# Patient Record
Sex: Female | Born: 1935 | Race: White | Hispanic: No | Smoking: Never smoker
Health system: Southern US, Community
[De-identification: ages and names within clinical notes are randomized; demographics above are authoritative.]

## PROBLEM LIST (undated history)

## (undated) DIAGNOSIS — M1990 Unspecified osteoarthritis, unspecified site: Secondary | ICD-10-CM

## (undated) DIAGNOSIS — E785 Hyperlipidemia, unspecified: Secondary | ICD-10-CM

## (undated) DIAGNOSIS — K5900 Constipation, unspecified: Secondary | ICD-10-CM

## (undated) DIAGNOSIS — K279 Peptic ulcer, site unspecified, unspecified as acute or chronic, without hemorrhage or perforation: Secondary | ICD-10-CM

## (undated) DIAGNOSIS — I10 Essential (primary) hypertension: Secondary | ICD-10-CM

## (undated) DIAGNOSIS — K8020 Calculus of gallbladder without cholecystitis without obstruction: Secondary | ICD-10-CM

## (undated) DIAGNOSIS — E119 Type 2 diabetes mellitus without complications: Secondary | ICD-10-CM

## (undated) DIAGNOSIS — G8929 Other chronic pain: Secondary | ICD-10-CM

## (undated) HISTORY — DX: Essential (primary) hypertension: I10

## (undated) HISTORY — DX: Constipation, unspecified: K59.00

## (undated) HISTORY — PX: CYSTOPLASTY: SHX475

## (undated) HISTORY — DX: Hyperlipidemia, unspecified: E78.5

## (undated) HISTORY — DX: Type 2 diabetes mellitus without complications: E11.9

## (undated) HISTORY — PX: OTHER SURGICAL HISTORY: SHX169

## (undated) HISTORY — DX: Unspecified osteoarthritis, unspecified site: M19.90

## (undated) HISTORY — DX: Other chronic pain: G89.29

## (undated) HISTORY — PX: ABDOMINAL HYSTERECTOMY: SHX81

## (undated) HISTORY — DX: Calculus of gallbladder without cholecystitis without obstruction: K80.20

## (undated) HISTORY — DX: Peptic ulcer, site unspecified, unspecified as acute or chronic, without hemorrhage or perforation: K27.9

---

## 2001-11-16 ENCOUNTER — Emergency Department (HOSPITAL_COMMUNITY): Admission: EM | Admit: 2001-11-16 | Discharge: 2001-11-16 | Payer: Self-pay | Admitting: *Deleted

## 2002-03-05 ENCOUNTER — Emergency Department (HOSPITAL_COMMUNITY): Admission: EM | Admit: 2002-03-05 | Discharge: 2002-03-05 | Payer: Self-pay | Admitting: Internal Medicine

## 2002-03-05 ENCOUNTER — Encounter: Payer: Self-pay | Admitting: Emergency Medicine

## 2002-06-08 ENCOUNTER — Emergency Department (HOSPITAL_COMMUNITY): Admission: EM | Admit: 2002-06-08 | Discharge: 2002-06-08 | Payer: Self-pay | Admitting: Emergency Medicine

## 2002-06-09 ENCOUNTER — Emergency Department (HOSPITAL_COMMUNITY): Admission: EM | Admit: 2002-06-09 | Discharge: 2002-06-09 | Payer: Self-pay | Admitting: *Deleted

## 2002-06-21 ENCOUNTER — Emergency Department (HOSPITAL_COMMUNITY): Admission: EM | Admit: 2002-06-21 | Discharge: 2002-06-21 | Payer: Self-pay | Admitting: Emergency Medicine

## 2003-04-02 ENCOUNTER — Emergency Department (HOSPITAL_COMMUNITY): Admission: EM | Admit: 2003-04-02 | Discharge: 2003-04-02 | Payer: Self-pay | Admitting: Emergency Medicine

## 2003-04-02 ENCOUNTER — Encounter: Payer: Self-pay | Admitting: Emergency Medicine

## 2003-12-17 ENCOUNTER — Ambulatory Visit (HOSPITAL_COMMUNITY): Admission: RE | Admit: 2003-12-17 | Discharge: 2003-12-17 | Payer: Self-pay | Admitting: Family Medicine

## 2005-08-12 ENCOUNTER — Emergency Department (HOSPITAL_COMMUNITY): Admission: EM | Admit: 2005-08-12 | Discharge: 2005-08-12 | Payer: Self-pay | Admitting: Emergency Medicine

## 2006-06-30 ENCOUNTER — Emergency Department (HOSPITAL_COMMUNITY): Admission: EM | Admit: 2006-06-30 | Discharge: 2006-06-30 | Payer: Self-pay | Admitting: Emergency Medicine

## 2006-10-14 ENCOUNTER — Emergency Department (HOSPITAL_COMMUNITY): Admission: EM | Admit: 2006-10-14 | Discharge: 2006-10-14 | Payer: Self-pay | Admitting: Emergency Medicine

## 2006-11-24 ENCOUNTER — Inpatient Hospital Stay (HOSPITAL_COMMUNITY): Admission: EM | Admit: 2006-11-24 | Discharge: 2006-12-04 | Payer: Self-pay | Admitting: Emergency Medicine

## 2006-11-24 ENCOUNTER — Ambulatory Visit: Payer: Self-pay | Admitting: Orthopedic Surgery

## 2006-11-27 ENCOUNTER — Ambulatory Visit: Payer: Self-pay | Admitting: Cardiovascular Disease

## 2006-12-04 ENCOUNTER — Inpatient Hospital Stay: Admission: AD | Admit: 2006-12-04 | Discharge: 2007-02-25 | Payer: Self-pay | Admitting: Family Medicine

## 2006-12-25 ENCOUNTER — Ambulatory Visit: Payer: Self-pay | Admitting: Orthopedic Surgery

## 2007-01-16 ENCOUNTER — Ambulatory Visit: Payer: Self-pay | Admitting: Orthopedic Surgery

## 2007-01-21 ENCOUNTER — Ambulatory Visit (HOSPITAL_COMMUNITY): Admission: RE | Admit: 2007-01-21 | Discharge: 2007-01-21 | Payer: Self-pay | Admitting: Family Medicine

## 2007-11-22 ENCOUNTER — Emergency Department (HOSPITAL_COMMUNITY): Admission: EM | Admit: 2007-11-22 | Discharge: 2007-11-22 | Payer: Self-pay | Admitting: Emergency Medicine

## 2008-02-04 ENCOUNTER — Emergency Department (HOSPITAL_COMMUNITY): Admission: EM | Admit: 2008-02-04 | Discharge: 2008-02-04 | Payer: Self-pay | Admitting: Emergency Medicine

## 2008-05-26 ENCOUNTER — Ambulatory Visit (HOSPITAL_COMMUNITY): Admission: RE | Admit: 2008-05-26 | Discharge: 2008-05-26 | Payer: Self-pay | Admitting: Family Medicine

## 2008-08-10 ENCOUNTER — Ambulatory Visit: Payer: Self-pay | Admitting: Gastroenterology

## 2008-08-10 ENCOUNTER — Inpatient Hospital Stay (HOSPITAL_COMMUNITY): Admission: EM | Admit: 2008-08-10 | Discharge: 2008-08-11 | Payer: Self-pay | Admitting: Emergency Medicine

## 2008-08-11 ENCOUNTER — Ambulatory Visit: Payer: Self-pay | Admitting: Gastroenterology

## 2008-08-11 ENCOUNTER — Encounter: Payer: Self-pay | Admitting: Gastroenterology

## 2008-08-28 ENCOUNTER — Inpatient Hospital Stay (HOSPITAL_COMMUNITY): Admission: EM | Admit: 2008-08-28 | Discharge: 2008-09-01 | Payer: Self-pay | Admitting: Emergency Medicine

## 2008-08-29 ENCOUNTER — Ambulatory Visit: Payer: Self-pay | Admitting: Gastroenterology

## 2008-08-30 ENCOUNTER — Ambulatory Visit: Payer: Self-pay | Admitting: Gastroenterology

## 2008-09-01 ENCOUNTER — Ambulatory Visit: Payer: Self-pay | Admitting: Internal Medicine

## 2008-12-03 ENCOUNTER — Ambulatory Visit: Payer: Self-pay | Admitting: Gastroenterology

## 2009-03-12 ENCOUNTER — Emergency Department (HOSPITAL_COMMUNITY): Admission: EM | Admit: 2009-03-12 | Discharge: 2009-03-12 | Payer: Self-pay | Admitting: Emergency Medicine

## 2009-07-08 ENCOUNTER — Emergency Department (HOSPITAL_COMMUNITY): Admission: EM | Admit: 2009-07-08 | Discharge: 2009-07-08 | Payer: Self-pay | Admitting: Emergency Medicine

## 2009-10-21 ENCOUNTER — Ambulatory Visit (HOSPITAL_COMMUNITY): Admission: RE | Admit: 2009-10-21 | Discharge: 2009-10-21 | Payer: Self-pay | Admitting: Family Medicine

## 2010-07-13 ENCOUNTER — Inpatient Hospital Stay (HOSPITAL_COMMUNITY)
Admission: EM | Admit: 2010-07-13 | Discharge: 2010-07-17 | Payer: Self-pay | Source: Home / Self Care | Admitting: Emergency Medicine

## 2010-10-07 ENCOUNTER — Emergency Department (HOSPITAL_COMMUNITY)
Admission: EM | Admit: 2010-10-07 | Discharge: 2010-10-07 | Payer: Self-pay | Source: Home / Self Care | Admitting: Emergency Medicine

## 2010-10-29 ENCOUNTER — Encounter: Payer: Self-pay | Admitting: Family Medicine

## 2010-12-20 LAB — CBC
MCV: 89.5 fL (ref 78.0–100.0)
Platelets: 188 10*3/uL (ref 150–400)
RBC: 4.07 MIL/uL (ref 3.87–5.11)

## 2010-12-20 LAB — URINALYSIS, ROUTINE W REFLEX MICROSCOPIC
Bilirubin Urine: NEGATIVE
Glucose, UA: NEGATIVE mg/dL
Urobilinogen, UA: 0.2 mg/dL (ref 0.0–1.0)

## 2010-12-20 LAB — BASIC METABOLIC PANEL
CO2: 28 mEq/L (ref 19–32)
Calcium: 9.6 mg/dL (ref 8.4–10.5)
Chloride: 105 mEq/L (ref 96–112)
Creatinine, Ser: 0.87 mg/dL (ref 0.4–1.2)
GFR calc Af Amer: >60 mL/min (ref >60)
GFR calc non Af Amer: >60 mL/min (ref >60)
Glucose, Bld: 86 mg/dL (ref 70–99)

## 2010-12-20 LAB — CULTURE, BLOOD (ROUTINE X 2)
Culture: NO GROWTH
Culture: NO GROWTH
Report Status: 10112011

## 2010-12-20 LAB — DIFFERENTIAL
Basophils Absolute: 0 10*3/uL (ref 0.0–0.1)
Basophils Relative: 0 % (ref 0–1)

## 2010-12-20 LAB — URINE MICROSCOPIC-ADD ON

## 2010-12-20 LAB — URINE CULTURE

## 2010-12-20 LAB — LACTIC ACID, PLASMA: Lactic Acid, Venous: 1.1 mmol/L (ref 0.5–2.2)

## 2011-01-11 LAB — COMPREHENSIVE METABOLIC PANEL
BUN: 19 mg/dL (ref 6–23)
CO2: 27 mEq/L (ref 19–32)
Calcium: 9.3 mg/dL (ref 8.4–10.5)
Creatinine, Ser: 0.99 mg/dL (ref 0.4–1.2)
GFR calc non Af Amer: 53 mL/min — ABNORMAL LOW (ref >60)
Glucose, Bld: 163 mg/dL — ABNORMAL HIGH (ref 70–99)

## 2011-01-11 LAB — DIFFERENTIAL
Eosinophils Absolute: 0.2 10*3/uL (ref 0.0–0.7)
Lymphocytes Relative: 22 % (ref 12–46)
Lymphs Abs: 1.3 10*3/uL (ref 0.7–4.0)
Neutro Abs: 3.8 10*3/uL (ref 1.7–7.7)
Neutrophils Relative %: 67 % (ref 43–77)

## 2011-01-11 LAB — CBC
Hemoglobin: 12.2 g/dL (ref 12.0–15.0)
MCHC: 35.6 g/dL (ref 30.0–36.0)
MCV: 90.5 fL (ref 78.0–100.0)
RBC: 3.79 MIL/uL — ABNORMAL LOW (ref 3.87–5.11)
RDW: 13.4 % (ref 11.5–15.5)

## 2011-01-15 LAB — URINALYSIS, ROUTINE W REFLEX MICROSCOPIC
Glucose, UA: NEGATIVE mg/dL
Protein, ur: NEGATIVE mg/dL
Specific Gravity, Urine: 1.025 (ref 1.005–1.030)
pH: 6.5 (ref 5.0–8.0)

## 2011-02-20 NOTE — H&P (Signed)
Chelsea [name], Chelsea [name]                ACCOUNT NO.:  [id number]   MEDICAL RECORD NO.:  [id number]          PATIENT TYPE:  INP   LOCATION:  A335                          FACILITY:  [facility]   PHYSICIAN:  [name]. [name], M.D.DATE OF BIRTH:  Oct 18, 1918   DATE OF ADMISSION:  08/27/2008  DATE OF DISCHARGE:  LH                              HISTORY & PHYSICAL   This 77 year old presented to the emergency room with wheezing and  shortness of breath early this morning.  She had recently been  hospitalized, just about 2 weeks ago with a GI bleeding, which is felt  to be due to her ulceration at the base of diverticulum and was also  felt to be secondary to her use of diclofenac.  At discharge the  diclofenac was supposedly discontinued.   CHRONIC MEDICAL PROBLEMS:  Mild diabetes, hypercholesterolemia,  cholelithiasis, sigmoid diverticulosis, and multilevel degenerative disk  disease of thoracolumbar spine.   She is very deaf.  History is difficult to obtain from her for this  reason.   Review of the record shows when she came to the emergency room, she had  no respiratory distress.  I did note she had had a sore throat that  begun a day prior to admission along with a cough productive of clear  sputum along with fever as well.   She had no chest pain.  She did note she had thrown up 3 times since she  came home, 1 time she throw up a black material, but that had been for a  week.  She noted no abdominal pain, no diarrhea, or constipation.   CURRENT MEDICATIONS:  1. Mycelex t.i.d.  2. Gemfibrozil 600 mg b.i.d.  3. Diclofenac 75 mg b.i.d., I am not sure if she is actually taking it      or not.   Her admission blood pressure is 151/84, pulse 84, respiratory rate 18,  and temperature 100.8.  The time, I saw her, she was oriented and alert.  No acute distress.  Again, major problems with profound deafness making  it necessary to yell at the patient to be heard.  She is very  cooperative and  nice, however, even with this disability.   She has no known allergies.   MEDICAL HISTORY:  See notes, includes hypertension, arthritis, chronic  back pain, gastric ulcer, and she has had a cystoplasty and a  hysterectomy for surgery.  She does not smoke, drink, or use drugs.  She  has had no recent air travel or prolonged road trip.  On my exam, her  mucous membranes are moist.  Skin turgor is normal.  She had no axillary  or supraclavicular adenopathy.  Her lungs appeared clear throughout.  The heart had a regular rhythm with a rate of about 100.  The abdomen  was soft and obese without organomegaly, mass, or tenderness.  There is  no edema of the ankles.   Admission white cell count is 10,900, hemoglobin 8.2., and platelet  count 300,000.  She had 94% neutrophils and 4 lymphs.  Admission BMET  showed a bicarb of  18 and glucose 312.  Recheck H and H 8.1/24.1 after a  unit of packed cells.  Blood cultures times 2 showed no growth less than  24 hours.   She had a CT of her abdomen and chest, just 2 days ago, due to chronic  right upper quadrant pain.  This showed cholelithiasis, bilateral renal  cysts, and multilevel degenerative disk disease, changes in  thoracolumbar spine with sigmoid diverticulosis without diverticulitis.  A small left ovarian cyst measuring 2.7 x 2.1 cm up from 1.6 x 1.2 cm in  her last CT.  Her chest x-ray showed cardiomegaly, interstitial  prominence, and no acute disease.   ADMISSION DIAGNOSES:  1. Presumptive asthmatic bronchitis.  2. Anemia probably secondary to gastrointestinal bleeding and with a      history of hematemesis, probably stomach ulcer.  3. Profound deafness.  4. Reflux esophagitis.  5. Colonic diverticuli.  6. Cholelithiasis.  7. Benign essential hypertension.  8. Diabetes.   Currently, she is getting another unit of packed cells and we will check  H and H after this.  She is on no medication for bronchitis, but appears  to be getting  better.  She did get a dose of Solu-Medrol 125 mg IV,  however.  She is on sensitive sliding scale and Rocephin 1 g IV q.24 h.,  albuterol neb treatments q.4 h. while awake, Tylenol for fever or pain,  and she is in IV normal saline 75 mL an hour.   I have discussed her case with Dr. Kassie Mends, her gastroenterologist.  I have asked her to take a look tomorrow to see if she might need an  EGD.  They were trying to find out information from home and see whether  she actually is using the diclofenac or not.      [name]. [name], M.D.  Electronically Signed     SDK/MEDQ  D:  08/28/2008  T:  08/28/2008  Job:  [id number]

## 2011-02-20 NOTE — Discharge Summary (Signed)
Chelsea [name], Chelsea [name]                ACCOUNT NO.:  [id number]   MEDICAL RECORD NO.:  [id number]          PATIENT TYPE:  ORB   LOCATION:  S116                          FACILITY:  [facility]   PHYSICIAN:  [name]. [name], M.D.DATE OF BIRTH:  1919/06/25   DATE OF ADMISSION:  12/04/2006  DATE OF DISCHARGE:  05/20/2008LH                               DISCHARGE SUMMARY   HISTORY OF PRESENT ILLNESS:  This 77 year old fell and fractured her hip  back in February.  After her hospitalization, she was transferred to the  [facility] where she has been for about 3 months.  She  has been recuperating during this having physical therapy.   I talked to physical therapy and she is able to ambulate with assistance  and with a walker, but she is not walking on her own, nor is she using a  walker on her own.  She is ready for discharge home tomorrow and has a  son who apparently is not working who will be with her full-time.   In the hospital, she has been on omeprazole 20 mg daily, multivitamin  daily, Senokot b.i.d., Colace 100 mg b.i.d., Coreg 6.25 mg b.i.d., and  enteric coated aspirin 81 mg daily.  She has had physical therapy five  times a week.   PHYSICAL EXAMINATION:  GENERAL:  Exam today showed an 77 year old woman  who appeared to be alert.  She was sitting up in bed.  She was in no  acute distress.  HEART:  Heart had a regular rhythm with rate of 70.  LUNGS:  Lungs were clear throughout.  SKIN:  Skin turgor was normal.  EXTREMITIES:  There was a 1/2 cm diameter 1 mm ulceration of the left  lateral malleolus and there is trace edema of the ankles and feet.   DISCHARGE DIAGNOSES:  1. Healed trochanteric fracture of left hip.  2. Status post rhabdomyolysis.  3. Status post dehydration.  4. Benign essential hypertension.  5. Decubitus on the left ankle.   PLAN:  She is being discharged home tomorrow and her son will take care  of her.  Arrangements have been made for to  have a wheeled walker and a  wheelchair and he will be assisting her.  She is to continue the  medicines as noted above.   ACTIVITY:  She is to have physical therapy 5 days a week as long as we  can arrange this.   FOLLOW UP:  Follow up in the office within 2 weeks.      [name]. [name], M.D.  Electronically Signed     SDK/MEDQ  D:  02/24/2007  T:  02/24/2007  Job:  [id number]

## 2011-02-20 NOTE — Group Therapy Note (Signed)
Chelsea [name], Chelsea [name]                ACCOUNT NO.:  [id number]   MEDICAL RECORD NO.:  [id number]          PATIENT TYPE:  INP   LOCATION:  A335                          FACILITY:  [facility]   PHYSICIAN:  [name]. [name], M.D.DATE OF BIRTH:  02/17/1919   DATE OF PROCEDURE:  DATE OF DISCHARGE:                                 PROGRESS NOTE   SUBJECTIVE:  She is feeling a good deal better today, but feels a little  fullness in the bladder.  There is no itching there.   OBJECTIVE:  Temperature 98.6, pulse 86, respiratory rate 20, blood  pressure 158/80.  She is sitting up in a chair.  She is in no acute  distress, well developed and obese.  Mucous membranes are moist.  Skin  turgor is normal.  She is moving air well.  The lungs are clear  throughout.  There are no intercostal retractions.  There is no use of  accessory muscles of respiration.  The heart has a regular rhythm, rate  of about 80.  The abdomen is soft and benign.   Glucose is 125, checked by Accu-Chek, and O2 sats on room air have been  96, 95, and 98.  Blood cultures x2 were negative at 2 days.   ASSESSMENT:  1. Pneumonia.  2. Anemia.  3. Mild fluid overload, now cleared.  4. Deafness.   PLAN:  Hep-Lock.  PT to ambulate in room and plan to discharge home  tomorrow as long as she continues with her current course.      [name]. [name], M.D.  Electronically Signed     SDK/MEDQ  D:  08/31/2008  T:  08/31/2008  Job:  [id number]

## 2011-02-20 NOTE — Group Therapy Note (Signed)
NAMEEISLEY, BARBER                ACCOUNT NO.:  [id number]   MEDICAL RECORD NO.:  [id number]          PATIENT TYPE:  INP   LOCATION:  A335                          FACILITY:  [facility]   PHYSICIAN:  [name]. [name], M.D.DATE OF BIRTH:  [birth year]   DATE OF PROCEDURE:  DATE OF DISCHARGE:                                 PROGRESS NOTE   SUBJECTIVE:  She is feeling somewhat better today.  Nursing also agrees  that she seems to be doing better today.  According to nursing,  yesterday she put out 500 mL after 40 mg IV Lasix.   OBJECTIVE:  VITAL SIGNS:  Temperature 97.8, pulse 81, respiratory rate  16, blood pressure 160/71, and O2 sats 94% on room air.  GENERAL:  She is able to sit up in bed.  Color is better.  She is  breathing somewhat better.  Still, there is a major problem with  hearing.  SKIN:  Turgor normal.  HEENT:  Mucous membranes moist.  LUNGS:  Scattered inspiratory and expiratory rhonchi with expiratory  wheezing throughout, but better today than yesterday, when she was  tighter.  No intercostal retractions or use of accessory muscles for  respiration.  HEART:  Regular rhythm, rate of 80.  ABDOMEN:  Soft without organomegaly or mass.  She does have some  tenderness in the right upper quadrant to deep palpation.  EXTREMITIES:  There is no edema of the ankles.   Glucose 141.  Today's white cell count is 12,000 which 77% neutrophils,  14 lymphs.  Hemoglobin 10.5 and a glucose 114.  Her chest x-ray done  yesterday showed new infiltrate in the left mid zone consisting with  pneumonia.   ASSESSMENT:  1. Pneumonia  2. Anemia.  3. Mild fluid overload, improved.  4. Deafness.  5. Constipation.   PLAN:  She will get another 40 of Lasix IV today.  Continue the Rocephin  1 g IV q.24 h.  Yesterday, I added Zithromax 500 mg IV q.24 h.  Add  Dulcolax suppository and Peri-Colace b.i.d. for constipation.  I  discussed with nursing.     [name]. [name], M.D.  Electronically  Signed    SDK/MEDQ  D:  08/30/2008  T:  08/30/2008  Job:  [id number]

## 2011-02-20 NOTE — Discharge Summary (Signed)
NAMEDRUCELLA, KARBOWSKI                ACCOUNT NO.:  [id number]   MEDICAL RECORD NO.:  [id number]          PATIENT TYPE:  INP   LOCATION:  A317                          FACILITY:  [facility]   PHYSICIAN:  [name]. [name], M.D.DATE OF BIRTH:  [birth year]   DATE OF ADMISSION:  08/10/2008  DATE OF DISCHARGE:  11/04/2009LH                               DISCHARGE SUMMARY   This 77 year old woman was admitted to hospital with rectal bleeding.  She had a benign 2-day hospitalization extending from August 10, 2008  to August 11, 2008.  Vital signs remained stable.   Her admission H&H was 9.96 and 29.0, white cell count 7300, and platelet  count 272,000.  Recheck hemoglobin later that afternoon was 8.6 and the  following day, the day of discharge, after 2 units of packed cells, it  was 10.5.  Her admission CMP was normal except for an alk phos of 223.  She had a BUN of 22 and creatinine of 1.05.  UA was essentially  negative.   She was admitted to the hospital for the diagnosis of GI bleeding.  She  had vitals q.4 h., close followup of her H&Hs.  She was kept on  lisinopril 10 mg daily, lorazepam 1 mg nightly, omeprazole 40 mg daily,  and given Tylenol 650 p.o. q.i.d. p.r.n. fever or pain.  She also was  given Mycelex cream to be applied under the breasts and the groin area  t.i.d.  I held her diclofenac 75 mg b.i.d. and gemfibrozil 600 mg b.i.d.  She had an IV normal saline at 100 mL an hour, clear liquid diet.   She was seen by GI and cleaned out for colonoscopy.  When her recheck  hemoglobin came back in the 8 range, she was typed and crossed for 2  units of packed cells and transfused these.  She was also given Protonix  40 mg IV q.24 h. at that time and required clonidine 0.1 p.o. q.4 h.  p.r.n. systolic blood pressure greater than 160 or diastolic greater  than 100.   She had a colonoscopy the second day and this showed a diverticulum with  clotted blood in it consistent with a  diverticular bleed.  She was not  actively bleeding; however, and otherwise felt fine.   I discussed her case with Dr. Cira Servant, Gastroenterology and was decided  that the patient would be stable to return home in the afternoon after  her colonoscopy.  However, she should stay off her diclofenac.   I told this and recommended that she go back on gemfibrozil 600 mg  b.i.d., but use no aspirin or other drugs like Advil or Aleve and so we  stopped her diclofenac and get followup with [place] within the week at which  point we will recheck her hemoglobin.   FINAL DISCHARGE DIAGNOSES:  1. Rectal bleeding secondary to a diverticular bleed.  2. Increased chance for bleeding on diclofenac.  3. Deafness.  4. Anemia secondary to rectal bleeding.  5. Reflux esophagitis.  6. Colonic diverticula.  7. Cholelithiasis.  8. Benign essential hypertension.  [name]. [name], M.D.  Electronically Signed     SDK/MEDQ  D:  08/11/2008  T:  08/12/2008  Job:  [id number]

## 2011-02-20 NOTE — Group Therapy Note (Signed)
Chelsea [name], Chelsea [name]                ACCOUNT NO.:  [id number]   MEDICAL RECORD NO.:  [id number]          PATIENT TYPE:  INP   LOCATION:  A335                          FACILITY:  [facility]   PHYSICIAN:  [name]. [name], M.D.DATE OF BIRTH:  [birth year]   DATE OF PROCEDURE:  08/29/2008  DATE OF DISCHARGE:                                 PROGRESS NOTE   SUBJECTIVE:  She had severe coughing last night.  She is already on  albuterol by nebulizer, but Tussionex teaspoon b.i.d. now t.i.d. has  been added.  She is still coughing.  She has had 3 blood transfusions  due to her severe anemia.   OBJECTIVE:  Currently, she is sitting up in bed.  She is a coughing  actively.  She looks uncomfortable, though she is in no acute distress.  She is well developed and obese.  Temperature is 97.9, pulse 99,  respiratory 20, blood pressure 160/71.  The lungs show inspiratory and  expiratory wheezing with expiratory wheezes worse the right posterior  base.  There are mild intercostal retraction or use of accessory muscle  of respiration.  Heart has a regular rate and rhythm of about 100.  Abdomen is soft.  There is no edema of the ankles.  Color is okay even  off O2.  Her glucoses were 156 to 130, O2 sat 95% on room air.   Her H&H today is 10.3 and 30.5.   ASSESSMENT:  1. Asthmatic bronchitis.  2. Possible mild fluid overload given the 3 transfusions of packed      cells.  3. Anemia of which is cleared with transfusion of 3 units packed      cells.   PLAN:  She will get albuterol by nebulizer q.4h. round-the-clock, q.2h  p.r.n.Marland Kitchen  She is now on Tussionex a teaspoon t.i.d.  I have instructed  nurse to give her furosemide 40 mg IV.  Portable chest x-ray is pending.  She will need an IV antibiotics.      [name]. [name], M.D.  Electronically Signed     SDK/MEDQ  D:  08/29/2008  T:  08/29/2008  Job:  [id number]

## 2011-02-20 NOTE — Consult Note (Signed)
NAMEMISHAAL, Chelsea [name]                ACCOUNT NO.:  [id number]   MEDICAL RECORD NO.:  [id number]          PATIENT TYPE:  INP   LOCATION:  A335                          FACILITY:  [facility]   PHYSICIAN:  Kassie Mends, M.D.      DATE OF BIRTH:  1919/02/03   DATE OF CONSULTATION:  08/27/2008  DATE OF DISCHARGE:                                 CONSULTATION   REFERRING PHYSICIAN:  [name]. [name], MD   REASON FOR CONSULTATION:  Anemia.   HISTORY OF PRESENT ILLNESS:  Chelsea [name] is an 77 year old female who  presented to the emergency department with shortness of breath.  She is  currently being treated for an asthma exacerbation.  Her most recent  hospitalization was early in November 2009 for rectal bleeding while on  NSAIDs.  She had a small rectal polyp removed which was an adenoma and  internal hemorrhoids.  She was also noted to have 2 small ulcers at the  base of the diverticulum that were likely contributing to her rectal  bleeding.  She has had no rectal bleeding since being in the hospital.  The history is limited because she was unable to complete full  sentences.  The information is obtained from the chart and nursing.  She  has not had a bowel movement since she has been here.  She wants more  aggressive bowel regimen.   PAST MEDICAL HISTORY:  1. Hypertension.  2. Depression.  3. Bilateral cataracts.   PAST SURGERY HISTORY:  1. Hysterectomy  2. Left hip repair.   ALLERGIES:  No known drug allergies.   MEDICATIONS:  1. Ventolin.  2. Rocephin.  3. NovoLog sliding scale.   FAMILY HISTORY:  She has no family history of colon cancer or colon  polyps.   SOCIAL HISTORY:  She is a widow and [place]  She does not use any tobacco or alcohol.   REVIEW OF SYSTEMS:  The patient appears winded and is unable to complete  full sentences.  Otherwise, all systems are negative.   PHYSICAL EXAMINATION:  VITAL SIGNS:  Temperature 98.7,  systolic blood  pressure is 163-132, and 98% on room air.GENERAL:  She is alert and  interactive, but unable to communicate effectively.HEENT:  Atraumatic  and normocephalic.  She currently has a nebulizer mask in place.NECK:  Full range of motion.  No lymphadenopathy.  LUNGS:  Coarse breath sounds diffusely with end-expiratory  wheezes.CARDIOVASCULAR:  Regular rhythm.  No murmur.ABDOMEN:  Bowel  sounds are present, soft, mildly distended, no rebound or guarding.  EXTREMITIES:  No cyanosis or edema.NEURO:  She is hard of hearing.   LABORATORY VALUES:  Hemoglobin 8.2-10.3 after 3 units of packed red  blood cells, creatinine 1.33, and blood cultures x2 negative.   RADIOGRAPHIC STUDIES:  Chest x-ray, PA and lateral on August 09, 2008,  showed cardiomegaly, but no evidence of acute cardiopulmonary disease.   ASSESSMENT:  Chelsea [name] is an 77 year old female who was recently  discharged with rectal bleeding and her hemoglobin is 8.2.  When she was  released,  it was 10.5.  She has had no known history of active bleeding.  She does have renal insufficiency.  Thank you for allowing me to see [name] in consultation.  My recommendations follow.   RECOMMENDATIONS:  1. Institute a aggressive bowel regimen because she has not had a      satisfactory bowel movement since she has been here.  She will      begin MiraLax every hour up to 8 doses until she has a bowel      movement.  We will also add Dulcolax 10 mg now, may repeat that in      6 hours if she has no bowel movement.  2. Due to her respiratory status, she is currently not a candidate for      conscious sedation, but she also has no acute indication for      endoscopy.  3. Could consider an EGD as an outpatient, to complete evaluation for      her anemia.  Outpatient visit in DEC/JAN to reassess anemia. She      should be on Nu-Iron 150 mg b.i.d. once her constipation is      resolved.      Kassie Mends, M.D.  Electronically  Signed     SM/MEDQ  D:  08/29/2008  T:  08/29/2008  Job:  [id number]   cc:   Chelsea [name], M.D.  Fax: [phone]

## 2011-02-20 NOTE — Discharge Summary (Signed)
Chelsea [name], GREENBERGER                ACCOUNT NO.:  [id number]   MEDICAL RECORD NO.:  [id number]          PATIENT TYPE:  INP   LOCATION:  A335                          FACILITY:  [facility]   PHYSICIAN:  [name]. [name], M.D.DATE OF BIRTH:  1919/04/26   DATE OF ADMISSION:  08/27/2008  DATE OF DISCHARGE:  11/25/2009LH                               DISCHARGE SUMMARY   This 77 year old woman was admitted to the hospital with presumptive  asthmatic bronchitis.  She had a benign 5-day hospitalization extending  from August 28, 2008 to September 01, 2008.  Vital signs remained  stable.   Admission white cell count was 10, 900, hemoglobin 8.2, and platelet  count 300,000.  She has 94% neutrophils and 4 lymphs.   Her BMP showed a bicarb of 88, glucose 312, BUN 50, and creatinine 1.33.  Repeat hemoglobin was 8.1 later that day after transfusion 4 units of  packed cells up to 9.1, then to 10.3 after 2 units of packed cells.  She  eventually required 3 units of packed cells.  White cell count  eventually went up to 12,177% neutrophils and hemoglobin 10.5.  BMP  showed glucose 114, BUN 60, and creatinine 0.98.  Blood cultures x2  showed no growth at 4 days both aerobic and anaerobic.   Admission chest x-ray showed cardiomegaly with interstitial prominence  and 2 days later, she had a hazy infiltrate in left mid zone consistent  with pneumonia.   She was admitted to the hospital.  She was put on IV of normal saline 75  mL an hour and albuterol neb treatments q.4 h. while awake with Rocephin  1 g IV q.24 h. regular diet and she was typed and crossed for packed red  cells and transfused 2 units.  I asked Dr. Cira Servant, GI, see her in consult  which she did.  GI workup was to be scheduled after her discharge.  The  patient's blood count came up nicely with transfusion, but it showed a  white cell count.  She had wheezing and coughing and felt had pneumonia  based on the chest x-ray.  Therefore, Rocephin  was continued with  Zithromax 500 mg IV daily.  Also, her second day IV was decreased to [facility]  since she already had a transfusion of 3 units of blood and showing some  expiratory wheezing.  It was felt that she might be in mild fluid  overload.  She is given 40 mg of furosemide IV stat at that time and  that dose repeated the next day.   She did well on this regimen gradually improving off the IVs by her  fourth day and able to be discharged home much improved by her fifth  day.   FINAL DISCHARGE DIAGNOSES:  1. Left-sided pneumonia.  2. Anemia, question etiology.  3. Profound deafness.  4. Reflux esophagitis.  5. Colonic diverticula.  6. Cholelithiasis.  7. Benign essential hypertension.  8. Diabetes.   She is discharged home on Os-Cal 600 plus D daily, gemfibrozil 600 mg  b.i.d., stool softener 250 mg b.i.d., Tylenol Arthritis 2 tablets  b.i.d.  as needed, Zithromax 500 mg the first day 250 daily for 4 days, and  Ceftin 500 mg b.i.d. for 5 days (#10, no refills).  Followup in the  office within a week at which point, we will recheck CBC.  We will also  arrange for her to see Dr. Cira Servant to see whether study need to be done to  evaluate her anemia.  Earlier in the month, she had a GI bleed secondary  to and also the base of the diverticulum and she is instructed to go off  diclofenac.  I am not sure she actually did this, but we are again  encouraging her to make sure she is taking no NSAIDs or diclofenac.      [name]. [name], M.D.  Electronically Signed     SDK/MEDQ  D:  09/01/2008  T:  09/02/2008  Job:  [id number]

## 2011-02-20 NOTE — Assessment & Plan Note (Signed)
Chelsea [name], TRAUGER                 CHART#:  [id number]   DATE:  12/03/2008                       DOB:  1919-01-20   PRIMARY CARE PHYSICIAN:  [name]. [name], MD   PROBLEM LIST:  1. Bleeding while on NSAIDs/anemia felt to be due to small      ulcers/diverticula seen on colonoscopy in November 2009, 2.      Hypertension.  2. Depression.  3. Bilateral cataracts.  4. Status post hysterectomy.  5. Status post left hip repair.  6. Tubular adenomas on last colonoscopy by Dr. Cira Servant on August 11, 2008.   SUBJECTIVE:  The patient is a 77 year old Caucasian female with history  of lower GI tract bleeding, felt to be caused by NSAIDs and ulcer.  Rectal bleeding was felt to be caused by 2-3 mm ulcers seen at the base  of the diverticulum.  She also had internal hemorrhoids.  She has done  well.  She has noticed no further rectal bleeding.  She is complaining  of increased belching and flatus.  She is complaining of some bloating,  some heartburn, and passing some foul-smelling gas.  She denies any  anorexia, nausea, or vomiting.  Her weight has remained stable.  She is  having small bowel movements everyday.  She is not having to use  laxatives.  She occasionally use stool softeners.  She has tried Maalox  occasionally for her heartburn.  She has not been on PPI.  She denies  any chest pain.  She has had some palpitations with exertion.  She has  mentioned this to Dr. [name] and there was some questions whether she  is going to be referred to a cardiologist.  She has not followed up with  this.   CURRENT MEDICATIONS:  See at the list from December 03, 2008.   ALLERGIES:  No known drug allergies.   OBJECTIVE:  VITAL SIGNS:  Weight, the patient is wheelchair bound,  unable to obtain; height 67 inches; temperature 98.1; blood pressure  158/100; and pulse 64.  GENERAL:  She is an obese, pale-appearing Caucasian female, who is  alert, oriented, pleasant, and cooperative, no acute  distress.  She is  hard of hearing.  HEENT:  Sclerae clear, nonicteric.  Conjunctivae pink.  Oropharynx pink  and moist without any lesions.CHEST:  Heart, regular rate and rhythm.  Normal S1 and S2.  ABDOMEN:  Protuberant with positive bowel sounds x4.  No bruits  auscultated.  Soft, nontender, nondistended without palpable mass or  hepatosplenomegaly.  No rebound, tenderness, or guarding.EXTREMITIES:  With trace lower extremity edema bilaterally.   ASSESSMENT:  The patient has history of lower gastrointestinal bleeding  from small ulcers at the site of diverticula.  She has done well.  She  has had no further bleeding that she is aware of.  She is having some  heartburn as well as abdominal bloating, belching, and flatus.  Her  constipation has improved.   PLAN:  1. Check CBC and LFTs today.  2. Abdominal gas bloat literature given for review.  3. Begin omeprazole 20 mg daily, #31 with 2 refill.  4. She is to call Dr. [name] to follow up regarding her palpitations      and she agrees that she will do this today.  [name], N.P.  Electronically Signed     Kassie Mends, M.D.  Electronically Signed    KJ/MEDQ  D:  12/03/2008  T:  12/03/2008  Job:  [id number]   cc:   [name]. [name], M.D.

## 2011-02-20 NOTE — H&P (Signed)
Chelsea [name], Chelsea [name]                ACCOUNT NO.:  [id number]   MEDICAL RECORD NO.:  [id number]          PATIENT TYPE:  INP   LOCATION:  A317                          FACILITY:  [facility]   PHYSICIAN:  [name]. [name], M.D.DATE OF BIRTH:  [birth year]   DATE OF ADMISSION:  08/10/2008  DATE OF DISCHARGE:  LH                              HISTORY & PHYSICAL   An 77 year old presented to the ER today complaining of some bleeding  when she go to the bathroom.  She had brief evaluation by the ER  physician, and it was found that there was no blood in her urine.  The  speculum exam of the vagina showed no blood, but there was blood on the  examining glove during the rectal exam.   She is generally healthy.   The list of her medical problems include hypertension,  hypercholesterolemia, gallstones, deafness, obesity, allergic rhinitis,  colonic diverticula, insomnia, reflux esophagitis, varicose veins, and  cholelithiasis.   Of note, she has been on diclofenac 75 mg b.i.d. recently for arthritic  symptoms involving ankles and elsewhere.  She is also on gemfibrozil 600  mg b.i.d., has been on nystatin oral suspension for her mouth, and  Mycelex as well.   She does not smoke or drink.  Currently, lives with a son in  [place].  She has no history of drug abuse.  Her major daily problem  is her severe hearing loss.   Review of systems was negative.  She does have occasional burning pain  in the abdomen.  She really has not had any diarrhea with this.  She has  had no vomiting.   Admission vital signs showed a pleasant elderly woman is very hard of  hearing.  Her BP is 153/84, pulse is 84, respiratory rate 18, and  temperature 99.3.  She appears to be oriented and alert, sitting up,  eating a clear liquid lunch.  She is in no acute distress.  Color  actually looks pretty good.  Mucous membranes are moist.  Her sentence  structure is intact.  There is no slurring of her speech.  She has a  great deal of trouble hearing, what I have to say here, however.  The  lungs are clear throughout.  The heart has regular rhythm and rate of  70.  The abdomen is soft and obese and somewhat protuberant without  demonstrable organomegaly, mass, or tenderness.  There is no edema in  the ankles.   Her admission white cell count is 7,300; H and H 9.96 and 29.0; MCV of  92; and platelet count 232,000.  CMP is essentially normal except for an  alkaline phosphatase of 223.  Routine UA was normal.   I reviewed the record from the office.  In August 2009, she had a  glucose 125, cholesterol 305, triglycerides 529, HDL 40, TSH was normal,  and B12 719.  At that time, gemfibrozil 600 mg b.i.d. was started.  She  had an abdominal CT and that showed cholelithiasis, bilateral renal  cysts including 1, 1-1/2, and 2-inch sigmoid diverticulosis, and  multilevel degenerative  disk disease of thoracolumbar spine.  She also  had a cyst in the left ovary.   ADMISSION DIAGNOSES:  1. Rectal bleeding.  The blood can be coming from diverticular bleed,      from the use of diclofenac, or from other reasons.  2. Probably acute anemia secondary to rectal bleeding.  3. Reflux esophagitis.  4. Colonic diverticula.  5. Severe deafness.  6. Cholelithiasis.  7. Benign essential hypertension.   I have stopped the diclofenac.  She will be on Protonix 40 mg IV q.24 h.  and also on IV normal saline at 100 an hour.  Another H and H is due,  and if hemoglobin drops further at this point, we will transfuse her 1-2  packs units of packed RBCs.  GI to see her and hopefully plan for  colonoscopy and possible EGD tomorrow.  She is far too old and frail to  undergo the prep for this at home and her terrible hearing makes that  much worse.  The alkaline phosphatase will also need to be evaluated and  it is either secondary to liver or bone disease.      [name]. [name], M.D.  Electronically Signed     SDK/MEDQ  D:   08/10/2008  T:  08/11/2008  Job:  [id number]

## 2011-02-20 NOTE — Op Note (Signed)
Chelsea [name], Chelsea [name]                ACCOUNT NO.:  [id number]   MEDICAL RECORD NO.:  [id number]          PATIENT TYPE:  INP   LOCATION:  A317                          FACILITY:  [facility]   PHYSICIAN:  Kassie Mends, M.D.      DATE OF BIRTH:  Jan 27, 1919   DATE OF PROCEDURE:  08/11/2008  DATE OF DISCHARGE:                               OPERATIVE REPORT   PROCEDURE:  Colonoscopy to the ileocecal valve with snare cautery  polypectomy.   INDICATION FOR EXAM:  Chelsea [name] is an 77 year old female who presented  with rectal bleeding.   FINDINGS:  1. Two 2-3 mm ulcers seen at the base of a diverticulum.  No active      bleeding.  No visible vessel or adherent clot.  Between 35 cm and      55 cm from the teeth is the extent of Chelsea [name]'s diverticular      disease.  Bright red blood was seen in the bowels but no evidence      of active bleeding.  She also had two clots which were rinsed off      of the base of the diverticulum but no active bleeding was noted.      No single diverticula was identified as the source for her      bleeding.  2. A 6 mm flat villous appearing rectal polyp removed via snare      cautery and cold forceps polypectomy.  3. Moderate internal hemorrhoids.  Otherwise, no masses, inflammatory      changes or AVMs seen.   DIAGNOSES:  1. Lower GI bleed secondary to diverticulosis and/or colon ulcers from      NSAIDs.  2. Rectal polyp.  3. Moderate internal hemorrhoids.   RECOMMENDATIONS:  1. Soft regular diet.  2. No aspirin or anti-inflammatory drugs for 30 days.  3. No anticoagulation for 7 days.  4. Continue hemoglobin and hematocrit every 12 hours.  5. Continue supportive care.  If she requires more than 10 units of      packed red blood cells then she should have a left hemicolectomy.   MEDICATIONS:  1. Demerol 100 mg IV.  2. Versed 5 mg IV.   PROCEDURE TECHNIQUE:  Physical exam was performed.  Informed consent was  obtained from the patient after explaining the  benefits, risks and  alternatives to the procedure.  The patient was connected to the monitor  and placed in left lateral position.  Continuous oxygen was provided by  nasal cannula and IV medicine administered through an indwelling  cannula.  After administration of sedation and rectal exam, the  patient's rectum was intubated and the scope was advanced under direct  visualization to the ileocecal valve.  Partial visualization of the  cecum was  performed.  Unable to intubate the scope to see behind the ileocecal  valve.  The scope was removed slowly by carefully examining the color,  texture, anatomy and integrity of the mucosa on the way out.  The  patient was recovered in endoscopy and discharged to the floor in  satisfactory condition.  Kassie Mends, M.D.  Electronically Signed     SM/MEDQ  D:  08/11/2008  T:  08/11/2008  Job:  [id number]   cc:   [name]. [name], M.D.  Fax: [phone]

## 2011-02-20 NOTE — Consult Note (Signed)
NAMESHAILEY, BUTTERBAUGH                ACCOUNT NO.:  [id number]   MEDICAL RECORD NO.:  [id number]          PATIENT TYPE:  INP   LOCATION:  A317                          FACILITY:  [facility]   PHYSICIAN:  Kassie Mends, M.D.      DATE OF BIRTH:  Dec 03, 1918   DATE OF CONSULTATION:  08/10/2008  DATE OF DISCHARGE:                                 CONSULTATION   REFERRING PHYSICIAN:  [name]. [name], M.D.   REASON FOR CONSULTATION:  Rectal bleeding.   HISTORY OF PRESENT ILLNESS:  Ms. Eastburn is a 77 year old female who had  an open reduction and internal fixation of her left hip with a Gamma  nail placement in February of 2008.  She has been maintained on  diclofenac.  She was seen in the emergency department for abdominal pain  in February of 2009 and was diagnosed with a urinary tract infection.  She was also seen in the emergency department for abdominal pain in  April of 2009.  She continues to complain of right upper quadrant  discomfort and in August of 2009 she had a CT scan of the abdomen and  pelvis with contrast.  It showed gallstones, bilateral renal cysts.  She  also had sigmoid colon diverticulosis without evidence of diverticulitis  and her uterus was surgically absent.  She came to the emergency room  today complaining of vaginal bleeding.  A vaginal speculum exam noted  vaginal stenosis.  No blood was seen.  She also had a urinalysis which  showed no blood.  She currently complains of burning in her breasts.  She has mild right upper quadrant pain.  She treats her heartburn daily  with Rolaids and Maalox.  She has a bowel movement every other day when  she takes a woman's laxative.  Her appetite has been good.  She denies  any weight loss.  She is a little short of breath and sometimes she  thinks she has asthma.  She has swelling in her ankles so she said she  is taking a pill for that.   PAST MEDICAL HISTORY:  1. Hypertension.  2. Possible depression.  3. Bilateral  cataracts.   PAST SURGICAL HISTORY:  Hysterectomy.   ALLERGIES:  No known drug allergies.   HOME MEDICATIONS:  1. Mycelex.  2. Lopid.  3. Diclofenac.   HOSPITAL MEDICATIONS:  1. Lisinopril  2. Ativan.  3. Nystatin/triamcinolone topical cream.  4. Protonix.   FAMILY HISTORY:  She has no family history colon cancer or colon polyps.   SOCIAL HISTORY:  She denies any tobacco or alcohol use.  She is widow  for 5 years and lives alone until she broke her hip last year.  She  currently [place]   REVIEW OF SYSTEMS:  As per the HPI, otherwise all systems are negative.  Her hemoglobin in February of 2009 was 11.6.  She also states that she  only ambulates with a walker.   PHYSICAL EXAMINATION:  Temperature 98.7, blood pressure 193/74, pulse  76.  IN GENERAL:  She is in  no apparent distress, alert and oriented x4.  HEENT:  Exam is atraumatic and normocephalic.  Pupils equal and reactive  to light.  Mouth:  No oral lesions.  Posterior pharynx without erythema  or exudate.  NECK:  Full range of motion.  No lymphadenopathy.  LUNGS:  Clear to auscultation bilaterally.  CARDIOVASCULAR:  Exam shows regular rhythm, no murmur, normal S1 and S2.  ABDOMEN:  Bowel sounds are present, soft, mildly distended, mild  tenderness to palpation in the right upper quadrant without rebound or  guarding.  EXTREMITIES:  No cyanosis or edema.  NEURO:  She is hard of hearing, but otherwise has no focal neurologic  deficits.   ASSESSMENT:  Ms. Torregrossa is a 77 year old female with painless rectal  bleeding.  The differential diagnosis includes nonsteroidal  antiinflammatory drug induced colon ulcer, polyp, colon cancer, and a  low likelihood of diverticular bleed.   Thank you for allowing me to see Ms. Labrie in consultation.  My  recommendations follow.   RECOMMENDATIONS:  1. Hemoglobin and hematocrit every 12 hours.  Protonix 40 mg daily at      7 a.m.   2. MiraLax prep for colonoscopy tomorrow.  3. Hold aspirin, NSAIDs and anticoagulation.  4. Could consider an EGD if no source for rectal bleeding is found.      Kassie Mends, M.D.  Electronically Signed     SM/MEDQ  D:  08/10/2008  T:  08/10/2008  Job:  [id number]   cc:   [name]. [name], M.D.  Fax: [phone]
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
## 2011-02-23 NOTE — Op Note (Signed)
[name], [name]                ACCOUNT NO.:  [id number]   MEDICAL RECORD NO.:  [id number]          PATIENT TYPE:  INP   LOCATION:  A225                          FACILITY:  [facility]   PHYSICIAN:  Vickki Hearing, M.D.DATE OF BIRTH:  [birth year]   DATE OF PROCEDURE:  DATE OF DISCHARGE:                               OPERATIVE REPORT   HISTORY:  This is an 77 year old female who fell and was not found for 6  days, in which she was found she was not able to walk and was brought to  the hospital by EMS, found have a left intertrochanteric hip fracture  which was unstable, comminuted and displaced.  She was placed in  traction and medical workup was started.  She presented with a  BUN/creatinine of 156/1.8 and had hemoglobin of 12.0.  Sodium was 148.   She was treated medically, stabilized by Dr. [name].  She was seen by  Cardiology and cleared for surgery.  She was transfused a unit of blood  for a hemoglobin of 10.4 prior to surgery.   PREOPERATIVE DIAGNOSIS:  Closed left hip fracture -- intertrochanteric.   POSTOPERATIVE DIAGNOSIS:  Closed left hip fracture -- intertrochanteric.   PROCEDURE:  Open reduction, internal fixation of the left hip with the  gamma nail.   SURGEON:  Vickki Hearing, M.D.   ASSISTANT:  Lynden Ang.   ANESTHETIC:  Spinal.   OPERATIVE FINDINGS:  The fracture was displaced and comminuted into 4  parts.  Attempts at closed reduction were unsuccessful on the lateral  plane with the superior fragment being anterior to the inferior  fragment.  A decision was made to treat this openly.  This fracture was  extra difficult to treat because of the delay in treatment causing the  soft tissue to contract; and 2ndary to the inabilty to obtain closed  reduction.   The patient was identified in preop holding area, the left hip was  marked for surgery and countersigned by the surgeon and history and  physical was updated.  The patient was taken to the  operating room for  spinal anesthetic and placed on the fracture table.  The left leg was  placed in traction.  Well leg was placed in well leg holder with  padding.  Peroneal post was padded as well.   The left hip was manipulated in slight abduction and external rotation  by placing traction on the limb, then internally rotated to a stable  configuration on the AP.  On the lateral view, it was noted that the  inferior fragment was posterior to the superior fragment and could not  be manipulated into position closed; we made a decision at that point to  open the fracture.   After sterile prep and drape, a time-out procedure was completed, the  surgical site was confirmed and antibiotics which had been started and  the end of the spinal were continued.   A straight incision was made over the greater trochanter and extended  down the shaft of the femur.  The greater trochanter was identified  after incision of the  fascia.  A curved awl was used to pass a guidewire  through the fracture site and down to the knee.  Fascia was split in  line with the skin incision and manual manipulation and clamps were used  to reduce the fracture; this was very difficult and required multiple  soft tissue releases; this was confirmed on AP and lateral views.  The  short 125-degree gamma nail was passed over the guidewire while the  fracture was held reduced by hand.  The guidewire was then removed.  A  Drill sleeve was placed in the 125-degree angle slot and a K-wire was  passed and manipulated until it was in the center of the femoral head on  the AP and lateral view.  Manual reduction was maintained through this  portion of the procedure.   The guidewire was measured, then over-reamed and a lag screw was passed.  The [place] was then secured proximally, backed off a half a turn to allow  some sliding of the lag screw.  Traction was released to allow impaction  of the fracture.  Radiographs confirmed  reduction was maintained.   Distal drill sleeve construct was then passed and with C-arm  verification, a transverse drill hole was made and screw was measured to  a 45 and screw was passed.   Final films showed adequate hardware position and reduction of the  fracture.   Wounds were copiously irrigated with saline and closed in layered  fashion with 0 Monocryl and staples.  We injected 30 mL of Marcaine  throughout the hip incision.  Postop plan is for the patient to use a  walker and she will have to be touchdown weightbearing until callus is  seen on the radiograph.      Vickki Hearing, M.D.  Electronically Signed     SEH/MEDQ  D:  11/29/2006  T:  11/30/2006  Job:  [id number]

## 2011-02-23 NOTE — Group Therapy Note (Signed)
Chelsea [name], FRIEL                ACCOUNT NO.:  [id number]   MEDICAL RECORD NO.:  [id number]          PATIENT TYPE:  INP   LOCATION:  A225                          FACILITY:  [facility]   PHYSICIAN:  [name]. [name], M.D.DATE OF BIRTH:  1919-03-13   DATE OF PROCEDURE:  DATE OF DISCHARGE:                                 PROGRESS NOTE   SUBJECTIVE:  Generally feeling well today.  She said she had a good  night's sleep last night for the first time since she has been up here.   OBJECTIVE:  Temperature is 98.4, pulse 72, respiratory rate 20, blood  pressure 153/69.  She is still very deaf, but still seems to be oriented and alert.  LUNGS:  Clear.  She is moving air well.  Color is good.  No intercostal  retraction.  No use of accessory muscles with respiration.  HEART:  Regular rhythm, rate of 70.  Heart sounds faint.  ABDOMEN:  Soft without organomegaly, mass, or tenderness.  There is no edema in the right leg.   BNP today is 350, potassium 3.3, glucose 124, O2 saturation on room air  95%, on 2L 99%.   ASSESSMENT:  1. Severe dehydration, which is cleared.  2. Mild hypokalemia.  3. Intertrochanteric fracture of the left hip.  4. Rhabdomyolysis.   PLAN:  She is due for a surgery today.  Need to monitor her potassium  and she will need a run of IV potassium today.      [name]. [name], M.D.  Electronically Signed     SDK/MEDQ  D:  11/29/2006  T:  11/29/2006  Job:  [id number]

## 2011-02-23 NOTE — Group Therapy Note (Signed)
NAMESHRON, OZER                ACCOUNT NO.:  [id number]   MEDICAL RECORD NO.:  [id number]          PATIENT TYPE:  INP   LOCATION:  A225                          FACILITY:  [facility]   PHYSICIAN:  [name]. [name], M.D.DATE OF BIRTH:  01-Nov-1918   DATE OF PROCEDURE:  DATE OF DISCHARGE:                                 PROGRESS NOTE   SUBJECTIVE:  She is somewhat confused this morning.   OBJECTIVE:  She is supine in bed, in no acute distress.  Well-developed,  well-nourished.  The left leg is still in traction.  Temperature 93, pulse 77, respiratory rate 20, blood pressure 163/79.  LUNGS:  Clear throughout.  HEART:  Appears to be regular rhythm noted about 70.  ABDOMEN:  Soft without tenderness, without organomegaly or mass.  There is no edema to the right ankle.   Today's H&H is 10.4/30.1, BUN 27, creatinine 0.93, and O2 saturation is  97% on room air.   ASSESSMENT:  1. Severe dehydration, now cleared.  2. Rhabdomyolysis, cleared.  3. Intertrochanteric fracture of the left hip.   PLAN:  Discussed with Dr. Fuller Canada, orthopedist.  The plan is to  have her in surgery tomorrow.  Also note, nurse informed me yesterday  that she had a run of 10 beats of VTAC, asked cardiology to see, and Dr.  Eden Emms reviewed her case and felt that the run was actually artifactual.  He did order a cardiac echo and put her on Coreg.      [name]. [name], M.D.  Electronically Signed     SDK/MEDQ  D:  11/28/2006  T:  11/28/2006  Job:  [id number]

## 2011-02-23 NOTE — Group Therapy Note (Signed)
Chelsea [name], Chelsea [name]                ACCOUNT NO.:  [id number]   MEDICAL RECORD NO.:  [id number]          PATIENT TYPE:  INP   LOCATION:  A225                          FACILITY:  [facility]   PHYSICIAN:  [name]. [name], M.D.DATE OF BIRTH:  1919/08/28   DATE OF PROCEDURE:  11/25/2006  DATE OF DISCHARGE:                                 PROGRESS NOTE   SUBJECTIVE:  The patient is still thirsty. She is still having pain in  the left hip.   OBJECTIVE:  She is supine in bed. She is well developed and obese. She  is alert, positive, and non-oriented (nursing says she has been confused  overnight). Mucous membranes appear moist. The skin turgor looks good.  Heart has a regular rate and rhythm at about 80 today. Lungs are clear  throughout, she is moving air well. Abdomen is soft and obese without  tenderness, without organomegaly or mass. She still has severe pain in  the left hip. She is able to move the right hip well. Today the  temperature is 98.2, pulse 88, respiratory 18, blood pressure 120/66,  oxygen saturation on room air is 99%.   ASSESSMENT:  1. Severe dehydration.  2. Rhabdomyolysis.  3. Intertrochanteric fracture of left hip.  4. Hyperglycemia.   PLAN:  Continue with IV fluids, but will switch to half normal saline  after current bag of fluids. She is drinking very well. Pending are CBC,  Met 7, and CPK. She will require repair of the fractured hip, but it  will be several days before she is medically stable for this.      [name]. [name], M.D.  Electronically Signed     SDK/MEDQ  D:  11/25/2006  T:  11/25/2006  Job:  [id number]

## 2011-02-23 NOTE — Group Therapy Note (Signed)
[name], [name]                ACCOUNT NO.:  [id number]   MEDICAL RECORD NO.:  [id number]          PATIENT TYPE:  INP   LOCATION:  A225                          FACILITY:  [facility]   PHYSICIAN:  [name]. [name], M.D.DATE OF BIRTH:  1919-04-30   DATE OF PROCEDURE:  DATE OF DISCHARGE:                                 PROGRESS NOTE   SUBJECTIVE:  She is getting bored of being in the hospital.   OBJECTIVE:  VITAL SIGNS:  Temperature is 98.6, pulse is 88, respiratory  rate 20, blood pressure 158/89.  GENERAL:  She is sitting up in a chair.  She appears to be alert.  There  is still a problem with hearing.  LUNGS:  Clear throughout.  HEART:  Regular rhythm and rate of 80.  ABDOMEN:  Soft without tenderness.  EXTREMITIES:  No edema in the right leg.  O2 sat is 93%.   ASSESSMENT:  1. Intratrochanteric fracture of the left hip status post surgery.  2. Dehydration now cleared.   PLAN:  Continue with recuperation in the hospital.  May need short-term  nursing home care.      [name]. [name], M.D.  Electronically Signed     SDK/MEDQ  D:  12/03/2006  T:  12/03/2006  Job:  [id number]
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
## 2011-02-23 NOTE — Consult Note (Signed)
NAMEJOANNAH, GITLIN                ACCOUNT NO.:  [id number]   MEDICAL RECORD NO.:  [id number]          PATIENT TYPE:  INP   LOCATION:  A225                          FACILITY:  [facility]   PHYSICIAN:  Vickki Hearing, M.D.DATE OF BIRTH:  [birth year]   DATE OF CONSULTATION:  DATE OF DISCHARGE:                                 CONSULTATION   ADDENDUM - ORTHOPEDIC PORTION OF DISCHARGE SUMMARY:  This patient is 77 years old.  She was found home on the floor, thought  to have been down for 6 days, was treated medically for dehydration,  hypernatremia, elevation in her BUN and creatinine, and then cleared for  surgery.   On November 29, 2006, she underwent open treatment internal fixation of  a left hip fracture with a gamma nail.  The fracture was difficult in  that it is hard to reduce on the lateral view, but we were able to  obtain an acceptable reduction, and the patient then went into a  therapeutic protocol for hip fractures.  She is maximum assist,  weightbears as tolerated, but is somewhat fearful of putting all her  weight on her left leg.   This patient should have her staples removed.  There are 3 wounds on the  lateral portion of the leg that should be taken out on December 09, 2006.  She should follow-up in the office on December 26, 2006, call [phone] to  get an appointment.  Again, she is weightbearing as tolerated for  therapy.  DVT prevention per Dr. [name].  All questions can be  referred to [phone].      Vickki Hearing, M.D.  Electronically Signed     SEH/MEDQ  D:  12/04/2006  T:  12/04/2006  Job:  [id number]

## 2011-02-23 NOTE — Group Therapy Note (Signed)
[name], MORNING                ACCOUNT NO.:  [id number]   MEDICAL RECORD NO.:  [id number]          PATIENT TYPE:  ORB   LOCATION:  S116                          FACILITY:  [facility]   PHYSICIAN:  Vickki Hearing, M.D.DATE OF BIRTH:  Mar 17, 1919   DATE OF PROCEDURE:  DATE OF DISCHARGE:                                 PROGRESS NOTE   PROGRESS NOTE:  The patient had a open treatment and internal fixation  of her left hip on 11/29/2006.  She was discharged to the [facility] at  the end of February.  She had her staples taken out yesterday for  increased redness at the surgical site.  There has been some bloody  drainage as well and we were to take a look at it.  She has a  subcutaneous hematoma at the central portion of the proximal wound.  There is an area of redness approximately the size of 2 quarters in  diameter.  The incision is closed.  This area is tender and the leg is  swollen at the area of the surgical site proximally.   Recommend stopping the Lovenox.  Start Cipro 500 mg p.o. q day for 7  days and reassess as needed.      Vickki Hearing, M.D.  Electronically Signed     SEH/MEDQ  D:  12/08/2006  T:  12/08/2006  Job:  [id number]

## 2011-02-23 NOTE — Group Therapy Note (Signed)
Chelsea [name], Chelsea [name]                ACCOUNT NO.:  [id number]   MEDICAL RECORD NO.:  [id number]          PATIENT TYPE:  ORB   LOCATION:  S116                          FACILITY:  [facility]   PHYSICIAN:  [name]. [name], M.D.DATE OF BIRTH:  22-Apr-1919   DATE OF PROCEDURE:  DATE OF DISCHARGE:                                 PROGRESS NOTE   She is being followed at the [facility] today.  She was admitted  here a little over a month ago after incurring a trochanteric fracture  of the left hip.  At the time she was severely dehydrated and suffering  from rhabdomyolysis and electrolyte abnormalities, et Karie Soda.  She is  now in Physical Therapy.  She is sitting in a chair.  She appears to be  alert and oriented.  Skin turgor is normal.  Mucus membranes moist.  The  heart has a regular rate and rhythm, lungs are clear and abdomen is  soft.  There is no edema of the ankles.   ASSESSMENT:  (1) Healed trochanteric fracture of the left hip.  (2)  Status post rhabdomyolysis.  (3) Status post dehydration.   PLAN:  Continue Physical Therapy.  She seems to be much improve as far  as general nutrition, hydration, and et Karie Soda.  Hopefully, she will be  able to be discharged to a Nursing Facility in the near future.      [name]. [name], M.D.  Electronically Signed     SDK/MEDQ  D:  01/12/2007  T:  01/12/2007  Job:  [id number]
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
## 2011-02-23 NOTE — Group Therapy Note (Signed)
[name], [name]                ACCOUNT NO.:  [id number]   MEDICAL RECORD NO.:  [id number]          PATIENT TYPE:  INP   LOCATION:  A225                          FACILITY:  [facility]   PHYSICIAN:  [name]. [name], M.D.DATE OF BIRTH:  1919/08/18   DATE OF PROCEDURE:  11/27/2006  DATE OF DISCHARGE:                                 PROGRESS NOTE   SUBJECTIVE:  She feels better today, but says she had problems sleeping  last night and has not had a bowel movement.   OBJECTIVE:  Temperature is 98.2, pulse 85, respiratory rate 20, blood  pressure 143/59.  She is semirecumbent in bed, oriented, alert and in no acute distress,  hard of hearing.  Color is good.  HEART:  Has a regular rhythm, rate of about 80.  LUNGS:  Clear throughout.  ABDOMEN:  Soft without hepatosplenomegaly or mass.  She is obese.  There is no edema of the distal right leg.   Today the H&H is 10.1/29.4 and the BUN is 45 with a creatinine 1.02  (down from 85 and 1.26) and the CK is 192 (down from 337 yesterday).   ASSESSMENT:  1. Severe dehydration.  2. Intertrochanteric fracture of the left hip.  3. Rhabdomyolysis.  4. Constipation.  5. Insomnia.   PLAN:  Add zolpidem 10 mg nightly.  Add Peri-Colace b.i.d. and Dulcolax  suppository daily p.r.n.  Cut back IV fluids to 50 mL an hour.      [name]. [name], M.D.  Electronically Signed     SDK/MEDQ  D:  11/27/2006  T:  11/27/2006  Job:  [id number]

## 2011-02-23 NOTE — Group Therapy Note (Signed)
Chelsea [name], Chelsea [name]                ACCOUNT NO.:  [id number]   MEDICAL RECORD NO.:  [id number]          PATIENT TYPE:  INP   LOCATION:  A225                          FACILITY:  [facility]   PHYSICIAN:  [name]. [name], M.D.DATE OF BIRTH:  07-04-1919   DATE OF PROCEDURE:  DATE OF DISCHARGE:                                 PROGRESS NOTE   SUBJECTIVE:  She has no specific complaint today except that she would  like to get up and move.  She had her surgery several days ago.   OBJECTIVE:  VITAL SIGNS:  Temperature 98.3, pulse 79, respiratory rate  20, blood pressure 146/70, O2 saturation 94% on room air and then 91% on  room air checked today.  GENERAL:  She is sitting up in a chair.  She is in no acute distress.  She is well-developed and obese and hard of hearing.  HEART:  Her heart seems to have an irregular irregularity with a 2/6  murmur that seems to be worse after a pause.  LUNGS:  Clear throughout.  ABDOMEN:  Soft and obese, without hepatosplenomegaly or mass.  EXTREMITIES:  There is no edema of the distal right leg.   LABORATORY DATA:  Today's H&H is 9.2/26.8, down from 10.1/29.5  yesterday.  MET7 shows a sodium of 133, glucose 116, BUN 35, creatinine  0.93, much improved.   Cardiac monitor shows P waves.   ASSESSMENT:  1. Intertrochanteric fracture of the left hip, status post surgery.  2. Severe dehydration, resolved.  3. Deafness.  4. Premature atrial contractions.  5. Anemia.   PLAN:  PT is working with her postoperatively.  She still is on some  fluids.  We will be following her hemoglobin.      [name]. [name], M.D.  Electronically Signed     SDK/MEDQ  D:  12/02/2006  T:  12/02/2006  Job:  [id number]
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
## 2011-02-23 NOTE — Procedures (Signed)
NAMETIMESHA, Chelsea [name]                ACCOUNT NO.:  [id number]   MEDICAL RECORD NO.:  [id number]          PATIENT TYPE:  INP   LOCATION:  A225                          FACILITY:  [facility]   PHYSICIAN:  Pricilla Riffle, MD, FACCDATE OF BIRTH:  1919-06-05   DATE OF PROCEDURE:  11/28/2006  DATE OF DISCHARGE:                                ECHOCARDIOGRAM   REFERRING PHYSICIAN:  Dr. [name]   TEST INDICATIONS:  An 77 year old with a hip fracture, test to evaluate  LV function.   2-D echo with echo Doppler.  The left ventricle is normal in size and  diastolic dimension of 36 mm.  The interventricular septum is moderately  thickened at 16 mm.  Posterior wall at 15 mm.  Left atrium is mildly  dilated.  Right atrium, right ventricle are normal.  Aortic root is  normal at 32 mm.   NOTE:  LVOT is narrow at 16 mm.   The aortic valve is mildly thickened.  There is no significant stenosis.   Note:  The peak gradient through the LV and aortic valve is 19 mmHg,  some may come from LVOT.   There is no aortic insufficiency.   Mitral valve is mildly thickened.  There is moderate annular  calcification.  There is trace insufficiency.   Pulmonic valve not well seen.   Tricuspid valve is normal with trace insufficiency.  Estimated PA  pressure by TR jet is 43 mmHg.   The LV systolic function is vigorous with near cavity obliteration.  Note mild diastolic dysfunction.  Again LVOT is narrow but there is no  significant obstruction at rest.  Peak gradient through the LV/aortic  valve is 19 mmHg.   RV systolic function is normal.   Trivial pericardial effusion.   IVC is normal sinus.      Pricilla Riffle, MD, [facility]  Electronically Signed     PVR/MEDQ  D:  11/28/2006  T:  11/28/2006  Job:  [id number]   cc:   [name]. [name], M.D.  Fax: [phone]

## 2011-02-23 NOTE — Group Therapy Note (Signed)
Chelsea [name], Chelsea [name]                ACCOUNT NO.:  [id number]   MEDICAL RECORD NO.:  [id number]          PATIENT TYPE:  INP   LOCATION:  A225                          FACILITY:  [facility]   PHYSICIAN:  [name]. [name], M.D.DATE OF BIRTH:  1919-05-10   DATE OF PROCEDURE:  DATE OF DISCHARGE:                                 PROGRESS NOTE   SUBJECTIVE:  Main problem she has is left hip pain.   OBJECTIVE:  GENERAL:  She is in traction supine in bed.  VITAL SIGNS:  Temperature 97.9, pulse 79, respiratory rate 20, blood  pressure 144/66.  NEURO:  She is appears to be alert.  Sentence structure is intact.  HEART:  A fairly regular rhythm rate of 80.  LUNGS:  Clear throughout.  Moving air well.  HEENT:  Color is good.  Mucous membranes are moist.  ABDOMEN:  Soft without hepatosplenomegaly, mass or tenderness.  EXTREMITIES:  There is no edema of the right ankle and again she is in  buck's traction.   Her white cell count is 9300 of which 78% is neutrophils, 10 lymphs this  down from 18,000, white cell count admission.  The BUN is 85, creatinine  1.268.  The BUN has now dropped 156-142-85.  The CK is 337 down from  982, 2 days ago and 441 yesterday.  SGOT is 57, SGPT is 72 and albumin  2.5.   ASSESSMENT:  1. Severe dehydration, improved.  2. Intertrochanteric fracture of the left hip.  3. Rhabdomyolysis.   PLAN:  Continue IV fluids and clear liquids.  Recheck CPK, CBC and met 7  in the morning.  Once she is medically stable and rehydrated she will  have her surgery.      [name]. [name], M.D.  Electronically Signed     SDK/MEDQ  D:  11/26/2006  T:  11/26/2006  Job:  [id number]

## 2011-05-17 ENCOUNTER — Emergency Department (HOSPITAL_COMMUNITY): Payer: Medicare Other

## 2011-05-17 ENCOUNTER — Inpatient Hospital Stay (HOSPITAL_COMMUNITY)
Admission: EM | Admit: 2011-05-17 | Discharge: 2011-05-30 | DRG: 603 | Disposition: A | Discharge status: Skilled Nursing Facility | Payer: Medicare Other | Attending: Internal Medicine | Admitting: Internal Medicine

## 2011-05-17 DIAGNOSIS — E119 Type 2 diabetes mellitus without complications: Secondary | ICD-10-CM | POA: Diagnosis present

## 2011-05-17 DIAGNOSIS — N83209 Unspecified ovarian cyst, unspecified side: Secondary | ICD-10-CM | POA: Diagnosis present

## 2011-05-17 DIAGNOSIS — L03119 Cellulitis of unspecified part of limb: Principal | ICD-10-CM | POA: Diagnosis present

## 2011-05-17 DIAGNOSIS — H9190 Unspecified hearing loss, unspecified ear: Secondary | ICD-10-CM | POA: Diagnosis present

## 2011-05-17 DIAGNOSIS — E785 Hyperlipidemia, unspecified: Secondary | ICD-10-CM | POA: Diagnosis present

## 2011-05-17 DIAGNOSIS — B965 Pseudomonas (aeruginosa) (mallei) (pseudomallei) as the cause of diseases classified elsewhere: Secondary | ICD-10-CM | POA: Diagnosis present

## 2011-05-17 DIAGNOSIS — L02419 Cutaneous abscess of limb, unspecified: Principal | ICD-10-CM | POA: Diagnosis present

## 2011-05-17 DIAGNOSIS — I10 Essential (primary) hypertension: Secondary | ICD-10-CM | POA: Diagnosis present

## 2011-05-17 LAB — DIFFERENTIAL
Eosinophils Absolute: 0.2 10*3/uL (ref 0.0–0.7)
Lymphs Abs: 1.8 10*3/uL (ref 0.7–4.0)
Neutrophils Relative %: 67 % (ref 43–77)

## 2011-05-17 LAB — BASIC METABOLIC PANEL
CO2: 27 mEq/L (ref 19–32)
Chloride: 101 mEq/L (ref 96–112)
Sodium: 138 mEq/L (ref 135–145)

## 2011-05-17 LAB — CBC
MCV: 89.4 fL (ref 78.0–100.0)
Platelets: 201 10*3/uL (ref 150–400)
RBC: 4.05 MIL/uL (ref 3.87–5.11)
WBC: 8.3 10*3/uL (ref 4.0–10.5)

## 2011-05-17 LAB — GLUCOSE, CAPILLARY: Glucose-Capillary: 119 mg/dL — ABNORMAL HIGH (ref 70–99)

## 2011-05-18 LAB — GLUCOSE, CAPILLARY
Glucose-Capillary: 138 mg/dL — ABNORMAL HIGH (ref 70–99)
Glucose-Capillary: 120 mg/dL — ABNORMAL HIGH (ref 70–99)

## 2011-05-18 LAB — CBC
HCT: 35.8 % — ABNORMAL LOW (ref 36.0–46.0)
MCV: 89.9 fL (ref 78.0–100.0)
RBC: 3.98 MIL/uL (ref 3.87–5.11)
WBC: 6.9 10*3/uL (ref 4.0–10.5)

## 2011-05-18 LAB — DIFFERENTIAL
Lymphocytes Relative: 25 % (ref 12–46)
Lymphs Abs: 1.7 10*3/uL (ref 0.7–4.0)
Neutro Abs: 4.3 10*3/uL (ref 1.7–7.7)
Neutrophils Relative %: 62 % (ref 43–77)

## 2011-05-18 LAB — COMPREHENSIVE METABOLIC PANEL
ALT: 11 U/L (ref 0–35)
AST: 15 U/L (ref 0–37)
Albumin: 3.7 g/dL (ref 3.5–5.2)
CO2: 25 mEq/L (ref 19–32)
Calcium: 9.5 mg/dL (ref 8.4–10.5)
Chloride: 102 mEq/L (ref 96–112)
GFR calc non Af Amer: 60 mL/min — ABNORMAL LOW (ref >60)
Sodium: 138 mEq/L (ref 135–145)
Total Bilirubin: 0.4 mg/dL (ref 0.3–1.2)

## 2011-05-18 LAB — HEMOGLOBIN A1C
Hgb A1c MFr Bld: 7 % — ABNORMAL HIGH (ref <5.7)
Mean Plasma Glucose: 154 mg/dL — ABNORMAL HIGH (ref <117)

## 2011-05-18 LAB — LIPID PANEL
HDL: 40 mg/dL (ref >39)
LDL Cholesterol: 187 mg/dL — ABNORMAL HIGH (ref 0–99)
VLDL: 62 mg/dL — ABNORMAL HIGH (ref 0–40)

## 2011-05-18 LAB — MAGNESIUM: Magnesium: 2.3 mg/dL (ref 1.5–2.5)

## 2011-05-19 LAB — URINALYSIS, ROUTINE W REFLEX MICROSCOPIC
Glucose, UA: NEGATIVE mg/dL
Hgb urine dipstick: NEGATIVE
Leukocytes, UA: NEGATIVE
Specific Gravity, Urine: 1.015 (ref 1.005–1.030)
pH: 6 (ref 5.0–8.0)

## 2011-05-19 LAB — BASIC METABOLIC PANEL
BUN: 21 mg/dL (ref 6–23)
Chloride: 101 mEq/L (ref 96–112)
Creatinine, Ser: 1.09 mg/dL (ref 0.50–1.10)
GFR calc Af Amer: 57 mL/min — ABNORMAL LOW (ref >60)
Glucose, Bld: 152 mg/dL — ABNORMAL HIGH (ref 70–99)

## 2011-05-19 LAB — GLUCOSE, CAPILLARY: Glucose-Capillary: 143 mg/dL — ABNORMAL HIGH (ref 70–99)

## 2011-05-20 LAB — GLUCOSE, CAPILLARY
Glucose-Capillary: 100 mg/dL — ABNORMAL HIGH (ref 70–99)
Glucose-Capillary: 95 mg/dL (ref 70–99)
Glucose-Capillary: 117 mg/dL — ABNORMAL HIGH (ref 70–99)
Glucose-Capillary: 173 mg/dL — ABNORMAL HIGH (ref 70–99)

## 2011-05-20 LAB — URINE CULTURE

## 2011-05-21 LAB — BASIC METABOLIC PANEL
BUN: 20 mg/dL (ref 6–23)
CO2: 27 mEq/L (ref 19–32)
Chloride: 101 mEq/L (ref 96–112)
Creatinine, Ser: 0.91 mg/dL (ref 0.50–1.10)
GFR calc Af Amer: >60 mL/min (ref >60)
Potassium: 3.5 mEq/L (ref 3.5–5.1)

## 2011-05-21 LAB — DIFFERENTIAL
Basophils Absolute: 0 10*3/uL (ref 0.0–0.1)
Eosinophils Relative: 2 % (ref 0–5)
Lymphocytes Relative: 9 % — ABNORMAL LOW (ref 12–46)
Lymphs Abs: 0.7 10*3/uL (ref 0.7–4.0)
Neutrophils Relative %: 83 % — ABNORMAL HIGH (ref 43–77)

## 2011-05-21 LAB — GLUCOSE, CAPILLARY
Glucose-Capillary: 123 mg/dL — ABNORMAL HIGH (ref 70–99)
Glucose-Capillary: 202 mg/dL — ABNORMAL HIGH (ref 70–99)
Glucose-Capillary: 131 mg/dL — ABNORMAL HIGH (ref 70–99)
Glucose-Capillary: 143 mg/dL — ABNORMAL HIGH (ref 70–99)

## 2011-05-21 LAB — CBC
HCT: 32.7 % — ABNORMAL LOW (ref 36.0–46.0)
MCV: 88.6 fL (ref 78.0–100.0)
Platelets: 174 10*3/uL (ref 150–400)
RBC: 3.69 MIL/uL — ABNORMAL LOW (ref 3.87–5.11)
RDW: 13.6 % (ref 11.5–15.5)
WBC: 8.2 10*3/uL (ref 4.0–10.5)

## 2011-05-22 LAB — DIFFERENTIAL
Basophils Absolute: 0 10*3/uL (ref 0.0–0.1)
Basophils Relative: 0 % (ref 0–1)
Eosinophils Relative: 3 % (ref 0–5)
Monocytes Absolute: 0.7 10*3/uL (ref 0.1–1.0)

## 2011-05-22 LAB — GLUCOSE, CAPILLARY: Glucose-Capillary: 143 mg/dL — ABNORMAL HIGH (ref 70–99)

## 2011-05-22 LAB — CBC
MCHC: 32.6 g/dL (ref 30.0–36.0)
Platelets: 186 10*3/uL (ref 150–400)
RDW: 13.5 % (ref 11.5–15.5)
WBC: 9.2 10*3/uL (ref 4.0–10.5)

## 2011-05-23 LAB — GLUCOSE, CAPILLARY
Glucose-Capillary: 160 mg/dL — ABNORMAL HIGH (ref 70–99)
Glucose-Capillary: 111 mg/dL — ABNORMAL HIGH (ref 70–99)

## 2011-05-23 LAB — CULTURE, BLOOD (ROUTINE X 2)
Culture  Setup Time: 201208091632
Culture: NO GROWTH

## 2011-05-24 ENCOUNTER — Inpatient Hospital Stay (HOSPITAL_COMMUNITY): Payer: Medicare Other

## 2011-05-24 DIAGNOSIS — M79609 Pain in unspecified limb: Secondary | ICD-10-CM

## 2011-05-24 LAB — BASIC METABOLIC PANEL
BUN: 25 mg/dL — ABNORMAL HIGH (ref 6–23)
Chloride: 100 mEq/L (ref 96–112)
Glucose, Bld: 92 mg/dL (ref 70–99)
Potassium: 3.6 mEq/L (ref 3.5–5.1)

## 2011-05-24 LAB — GLUCOSE, CAPILLARY
Glucose-Capillary: 118 mg/dL — ABNORMAL HIGH (ref 70–99)
Glucose-Capillary: 92 mg/dL (ref 70–99)

## 2011-05-24 LAB — CBC
Hemoglobin: 11.6 g/dL — ABNORMAL LOW (ref 12.0–15.0)
MCHC: 33 g/dL (ref 30.0–36.0)
RDW: 13.7 % (ref 11.5–15.5)

## 2011-05-25 DIAGNOSIS — F4320 Adjustment disorder, unspecified: Secondary | ICD-10-CM

## 2011-05-25 LAB — GLUCOSE, CAPILLARY
Glucose-Capillary: 110 mg/dL — ABNORMAL HIGH (ref 70–99)
Glucose-Capillary: 106 mg/dL — ABNORMAL HIGH (ref 70–99)

## 2011-05-26 LAB — GLUCOSE, CAPILLARY
Glucose-Capillary: 107 mg/dL — ABNORMAL HIGH (ref 70–99)
Glucose-Capillary: 87 mg/dL (ref 70–99)
Glucose-Capillary: 123 mg/dL — ABNORMAL HIGH (ref 70–99)

## 2011-05-27 LAB — GLUCOSE, CAPILLARY
Glucose-Capillary: 101 mg/dL — ABNORMAL HIGH (ref 70–99)
Glucose-Capillary: 104 mg/dL — ABNORMAL HIGH (ref 70–99)
Glucose-Capillary: 113 mg/dL — ABNORMAL HIGH (ref 70–99)
Glucose-Capillary: 118 mg/dL — ABNORMAL HIGH (ref 70–99)

## 2011-05-27 LAB — WOUND CULTURE

## 2011-05-28 LAB — GLUCOSE, CAPILLARY: Glucose-Capillary: 113 mg/dL — ABNORMAL HIGH (ref 70–99)

## 2011-05-29 LAB — GLUCOSE, CAPILLARY
Glucose-Capillary: 113 mg/dL — ABNORMAL HIGH (ref 70–99)
Glucose-Capillary: 93 mg/dL (ref 70–99)
Glucose-Capillary: 119 mg/dL — ABNORMAL HIGH (ref 70–99)

## 2011-05-29 NOTE — Discharge Summary (Signed)
  NAMEMAKAYLEE, SPIELBERG NO.:  [id number]  MEDICAL RECORD NO.:  [id number]  LOCATION:  5016                         FACILITY:  [facility]  PHYSICIAN:  Talmage Nap, MD  DATE OF BIRTH:  1918-11-29  DATE OF ADMISSION:  05/17/2011 DATE OF DISCHARGE:                        DISCHARGE SUMMARY - REFERRING   ADDENDUM  POSSIBLE DATE OF DISCHARGE:  May 29, 2011 or May 30, 2011  For extensive hospital course, discharge diagnoses, and medications see discharge summary dictated by Dr. Isidoro Donning on May 28, 2011.  The patient was however seen by me today which is May 29, 2011.  Denies any complaints.  Examination of the patient was essentially unremarkable. Vital Signs:  Blood pressure is 114/ 93, pulse 81, respiratory rate 18, temperature is 98.4, medically stable.  Plan is for the patient to be discharged home on home health.  For discharge medications, see discharge summary dictated by Dr. Isidoro Donning.     Talmage Nap, MD CN/MEDQ  D:  05/29/2011  T:  05/29/2011  Job:  [id number]  Electronically Signed by Talmage Nap  on 05/29/2011 08:11:56 PM

## 2011-05-30 LAB — GLUCOSE, CAPILLARY: Glucose-Capillary: 105 mg/dL — ABNORMAL HIGH (ref 70–99)

## 2011-05-30 NOTE — Consult Note (Signed)
  NAMETREANNA, [name]                ACCOUNT NO.:  [id number]  MEDICAL RECORD NO.:  [id number]  LOCATION:  5016                         FACILITY:  [facility]  PHYSICIAN:  Eulogio Ditch, MD DATE OF BIRTH:  Sep 26, 1919  DATE OF CONSULTATION:  05/25/2011 DATE OF DISCHARGE:                                CONSULTATION   REASON FOR CONSULTATION:  Capacity evaluation.  HISTORY OF PRESENT ILLNESS:  An 77 year old Caucasian female with a number of medical issues like hypertension, diabetes, right leg cellulitis.  She was not on any medication when she came to the hospital. She had increased inflammation, swelling, and open wound of her right hip.As the wound became  became worse, that is why she was brought to the hospital.  Her blood pressure was also high at the time of admission.  The patient is nearly deaf I have to take help of the nurse to speak with the patient.  By reviewing her chart, the patient is going to the skilled nursing facility.  She need continued  antibiotics, but the patient initially stated that she wants to go but then later on refused.  On asking her rational, she was not able to give any rational of her decision not going to the SNF.  The patient's son is in the hospital and granddaughter is only at home for a few hours.  The patient also has difficulty in walking.  I asked her about her medical issue, she was not able to tell her medical problems.  She told me she want to speak with Foot and Ank[facility] doctor.  On asking what is the name of the hospital, initially she said this is [facility] but then later on she said [facility].  She was able to tell me that we are in May 18, 2011.Her attention and concentration is poor.  Memory is poor.  Insight and judgment is poor.  The patient does not seem to be hallucinating or delusional, but she has a poor insight into her right leg cellulitis.  At this time, I will say the patient has no support system at home  and she needs a continuous treatment for her cellulitis.  She is danger to herself, so she do not have capacity to make this decision at this time.  Thanks for involving me in taking care of this patient.     Eulogio Ditch, MD     SA/MEDQ  D:  05/25/2011  T:  05/25/2011  Job:  [id number]  Electronically Signed by Eulogio Ditch  on 05/30/2011 09:02:13 AM

## 2011-05-30 NOTE — Discharge Summary (Signed)
  [name], Chelsea [name] NO.:  [id number]  MEDICAL RECORD NO.:  [id number]  LOCATION:  5016                         FACILITY:  [facility]  PHYSICIAN:  Talmage Nap, MD  DATE OF BIRTH:  December 28, 1918  DATE OF ADMISSION:  05/17/2011 DATE OF DISCHARGE:                        DISCHARGE SUMMARY - REFERRING   ADDENDUM  DATE OF DISCHARGE:  As soon as family members are available to pick up the patient.  The patient was seen by me today which is May 30, 2011. Denies any complaints.  Examination on the patient is essentially unremarkable.  Vital Signs: Blood pressure is 140/62, pulse rate 65, respiratory rate 18, temperature is 98.0. The patient is medically stable.  Plan is for the patient to be discharged home as soon as family members are available.  Previous discharge medication and extensive hospital course as well as pertinent diagnosis, refer to the discharge summary dictated by Dr. Thad Ranger on May 28, 2011.     Talmage Nap, MD     CN/MEDQ  D:  05/30/2011  T:  05/30/2011  Job:  [id number]  Electronically Signed by Talmage Nap  on 05/30/2011 02:41:39 PM

## 2011-06-04 LAB — GLUCOSE, CAPILLARY

## 2011-06-25 NOTE — Discharge Summary (Signed)
Chelsea [name], Chelsea [name]                ACCOUNT NO.:  [id number]  MEDICAL RECORD NO.:  [id number]  LOCATION:  5016                         FACILITY:  [facility]  PHYSICIAN:  Thad Ranger, MD       DATE OF BIRTH:  1918-11-19  DATE OF ADMISSION:  05/17/2011 DATE OF DISCHARGE:                              DISCHARGE SUMMARY   PRIMARY CARE PHYSICIAN:  Angus G. Renard Matter, MD in [place] area.  DISCHARGE DIAGNOSES: 1. Right leg cellulitis with 3 open wounds requiring aggressive wound     care. 2. Pseudomonas in the wounds sensitive to ciprofloxacin. 3. Malignant hypertension, improved. 4. Ovarian cyst. 5. Diabetes mellitus. 6. Deafness. 7. History of peptic ulcer disease. 8. Chronic pain.  FINAL DISCHARGE MEDICATIONS:  Please note that the these are updated in final discharge medications. 1. Amlodipine 10 mg p.o. daily. 2. Ciprofloxacin 500 mg p.o. b.i.d. for another 2 weeks. 3. Santyl ointment 1 application topically daily. 4. Doxycycline 100 mg p.o. b.i.d. for another 2 weeks. 5. Hydrochlorothiazide 25 mg p.o. daily. 6. Vicodin 5/325 mg 1 tablet every 6 hours as needed for pain. 7. Lisinopril 40 mg daily. 8. Metformin 500 mg p.o. b.i.d. with meals. 9. Protonix 40 mg p.o. daily. 10.Crestor 10 mg p.o. once at bedtime.  Please refer to my previously dictated discharge summary on May 25, 2011.  The patient remained in inpatient for skilled nursing facility bed placement.  ADDITIONAL HOSPITALIZATION COURSE:  Chelsea [name] is a 77 year old female who was admitted with right lower extremity cellulitis and free open wounds. 1. Right lower extremity with open wounds.  The patient was admitted     and was placed on vancomycin and Invanz.  Wound care consult was     obtained and the patient has been receiving aggressive wound care     and dressing changes daily.  The patient was then transitioned to     Keflex and doxycycline.  However, the wound culture came back as     Pseudomonas  aeruginosa pansensitive and sensitive to oral     ciprofloxacin.  The patient should continue doxycycline for staph     coverage and ciprofloxacin for Pseudomonas coverage for another 2     weeks. 2. Malignant hypertension.  This has been improving.  The patient was     placed on multiple antihypertensives and blood pressure has been     significantly improved.  DISPOSITION:  The patient needs aggressive wound care along with antibiotics and dressing changes and 24/7 supervision at home per the physical therapy evaluation.  The patient's discharge was delayed needing skilled nursing facility bed.  The patient also had psychiatry evaluation on May 25, 2011 and per psychiatry the patient does not have a support system at home and she needs a continuous treatment for her cellulitis and wounds.  She is danger to herself, so she does not have capacity to make decisions regarding the disposition.  The patient will be discharged to skilled nursing facility pending bed available.  DISCHARGE FOLLOWUP:  With Dr. Butch Penny in next 2 weeks for hospital followup and [facility] in next 7 days for followup.  DISCHARGE TIME:  35 minutes.  DISCHARGE PHYSICAL EXAMINATION:  VITAL SIGNS AT THE TIME OF DICTATION: Temperature 98.5, pulse 74, respirations 20, blood pressure 164/75, O2 sat 97% on room air. GENERAL:  The patient is alert and awake.  Not in any acute distress. HEENT:  Anicteric sclerae and conjunctivae.  Pupils are equal and reactive to light and accommodation.  EOMI. NECK:  Supple.  No lymphadenopathy. CVS:  S1, S2 clear. CHEST:  Clear to auscultation bilaterally. ABDOMEN:  Soft, nontender, nondistended.  Normal bowel sounds. EXTREMITIES:  Right lower leg dressing intact with no lower extremity edema on the left.  DISCHARGE TIME:  35 minutes.     Thad Ranger, MD     RR/MEDQ  D:  05/28/2011  T:  05/28/2011  Job:  [id number]  cc:   Angus G. Renard Matter,  MD  Electronically Signed by Andres Labrum Octavis Sheeler  on 06/25/2011 03:40:49 PM

## 2011-06-25 NOTE — Discharge Summary (Signed)
Chelsea [name], Chelsea [name]                ACCOUNT NO.:  [id number]  MEDICAL RECORD NO.:  [id number]  LOCATION:  5016                         FACILITY:  [facility]  PHYSICIAN:  Thad Ranger, MD       DATE OF BIRTH:  [birth year]  DATE OF ADMISSION:  05/17/2011 DATE OF DISCHARGE:                              DISCHARGE SUMMARY   PRIMARY CARE PHYSICIAN:  Angus G. Renard Matter, MD in [place] area.  DISCHARGE DIAGNOSES: 1. Right leg cellulitis with 3 open wounds requiring wound care. 2. Malignant hypertension, improved. 3. Ovarian cyst. 4. Diabetes mellitus. 5. Deafness. 6. History of peptic ulcer disease. 7. Chronic pain.  DISCHARGE MEDICATIONS: 1. Lisinopril 20 mg p.o. daily. 2. Metformin 500 mg p.o. b.i.d. 3. Norvasc 10 mg p.o. daily. 4. Augmentin 500 mg p.o. t.i.d. for 2 weeks. 5. Doxycycline 100 mg p.o. b.i.d. for 2 weeks. 6. Crestor 10 mg p.o. at bedtime. 7. Vicodin 5/325 mg 1 tablet p.o. q.6 h. P.r.n. 8. Protonix 40 mg p.o. daily. 9. Santyl ointment 1 application daily. 10.Hydrochlorothiazide 12.5 mg p.o. daily.  BRIEF HISTORY OF PRESENT ILLNESS:  At the time of admission, Chelsea [name] was 77 year old female with past medical history of hypertension, diabetes, not on any medications who presented with 2-3 months of increased inflammation, swelling, and open sores of right leg.  The patient states that she had not seen anyone for this but at times she had ointment put on it.  The patient presented to the hospital for the treatment of this.  While she was seen in the emergency room, her blood pressure was noted to be in 230s.  RADIOLOGICAL DATA:  Right ankle x-ray August 9, no acute osseous abnormalities, no evidence suggestive of osteomyelitis.  Right tibia fibula x-ray August 9, no evidence of osteomyelitis of the tibia-fibula, osteoarthritis of the right knee.  MRI of the tibia-fibula, right showed low level subcutaneous edema noted in both lower legs and could represent mild  cellulitis of noninfectious subcutaneous edema, no evidence of abscess or osteomyelitis.  Pelvic ultrasound August 16, left adnexal cyst, no definitive separate left ovary or surrounding ovarian tissues is identified but this is likely ovarian etiology.  No worrisome characteristics are identified on this.  Transabdominal evaluation likely represents a benign postmenopausal ovarian or adnexal cyst.  If the patient is asymptomatic given size, follow up in 1 year.  Wound cultures preliminary showed few WBCs, predominantly PMNs, no squamous epithelial cells, Gram negative rods.  Blood cultures remain negative till date.  BRIEF HOSPITALIZATION COURSE:  Chelsea [name] is an 77 year old female who was admitted with right lower extremity cellulitis and open wounds. 1. Right lower extremity cellulitis with open wounds.  The patient was     admitted and was started on vancomycin and Invanz.  Wound care     consult was obtained and the patient has been receiving aggressive     wound care and dressing changes daily.  The patient was     transitioned to Keflex and doxycycline.  Given the wound cultures     also showed some Gram-negative rods.  She should continue the     doxycycline and Keflex for another 2 weeks post discharge.  MRI of     the right lower extremities were obtained which was negative for     any osteomyelitis.  The patient will need aggressive wound care     post hospitalization.  ABIs were also obtained which did not show     any severe peripheral vascular disease and were normal. 2. Malignant hypertension.  The patient was noted to have the blood     pressure in 240s at the time of admission.  The patient was placed     on Norvasc, Prinivil, and p.r.n. Catapres which did improve her     blood pressure during the hospitalization. 3. Ovarian cyst.  During the hospitalization, the patient stated that     she had ovarian cancer, however, pelvic ultrasound was obtained     which showed  left adnexal cyst.  This was clearly explained to the     patient and followup recommended ultrasound in 1 year. 4. History of mild diabetes.  HbA1c was checked which was 7.0 with a     mean plasma glucose of 154.  The patient was started on metformin     during hospitalization.  Recommend to continue carb-modified diet.  DISPOSITION:  The patient needs aggressive wound care along with antibiotics and dressing changes daily and 24/7 supervision at home per the physical therapy evaluation.  The patient will likely need a skilled nursing facility pending bed available.  She does not have 24/7 supervision at this time.  DISCHARGE FOLLOWUP:  With Dr. Butch Penny in next 2-3 weeks for hospital followup and [facility] in next 7-10 days for followup.  DISCHARGE TIME:  45 minutes.     Thad Ranger, MD     RR/MEDQ  D:  05/25/2011  T:  05/25/2011  Job:  [id number]  cc:   Angus G. Renard Matter, MD [facility]  Electronically Signed by RIPUDEEP RAI  on 06/25/2011 03:40:46 PM

## 2011-06-29 LAB — URINALYSIS, ROUTINE W REFLEX MICROSCOPIC
Ketones, ur: NEGATIVE
Nitrite: POSITIVE — AB
Specific Gravity, Urine: 1.015
pH: 7.5

## 2011-06-29 LAB — DIFFERENTIAL
Lymphocytes Relative: 19
Lymphs Abs: 2
Monocytes Relative: 8
Neutro Abs: 7.5
Neutrophils Relative %: 71

## 2011-06-29 LAB — COMPREHENSIVE METABOLIC PANEL
ALT: 11
BUN: 19
Calcium: 9.3
Creatinine, Ser: 0.81
Glucose, Bld: 111 — ABNORMAL HIGH
Sodium: 136
Total Protein: 7.5

## 2011-06-29 LAB — URINE MICROSCOPIC-ADD ON

## 2011-06-29 LAB — CBC
Hemoglobin: 11.6 — ABNORMAL LOW
MCHC: 35.5
RDW: 13.1

## 2011-07-10 LAB — BASIC METABOLIC PANEL
BUN: 15
CO2: 18 — ABNORMAL LOW
CO2: 28
Calcium: 8.8
Chloride: 104
Chloride: 104
Creatinine, Ser: 1.33 — ABNORMAL HIGH
GFR calc Af Amer: >60
Glucose, Bld: 114 — ABNORMAL HIGH
Glucose, Bld: 312 — ABNORMAL HIGH
Potassium: 3.7
Potassium: 4
Sodium: 139

## 2011-07-10 LAB — CROSSMATCH
ABO/RH(D): A POS
Antibody Screen: NEGATIVE
Antibody Screen: NEGATIVE

## 2011-07-10 LAB — GLUCOSE, CAPILLARY
Glucose-Capillary: 100 — ABNORMAL HIGH
Glucose-Capillary: 110 — ABNORMAL HIGH
Glucose-Capillary: 112 — ABNORMAL HIGH
Glucose-Capillary: 113 — ABNORMAL HIGH
Glucose-Capillary: 114 — ABNORMAL HIGH
Glucose-Capillary: 122 — ABNORMAL HIGH
Glucose-Capillary: 125 — ABNORMAL HIGH
Glucose-Capillary: 130 — ABNORMAL HIGH
Glucose-Capillary: 131 — ABNORMAL HIGH
Glucose-Capillary: 137 — ABNORMAL HIGH
Glucose-Capillary: 141 — ABNORMAL HIGH
Glucose-Capillary: 143 — ABNORMAL HIGH
Glucose-Capillary: 151 — ABNORMAL HIGH
Glucose-Capillary: 156 — ABNORMAL HIGH
Glucose-Capillary: 164 — ABNORMAL HIGH
Glucose-Capillary: 266 — ABNORMAL HIGH
Glucose-Capillary: 282 — ABNORMAL HIGH
Glucose-Capillary: 298 — ABNORMAL HIGH
Glucose-Capillary: 86

## 2011-07-10 LAB — CBC
HCT: 24.2 — ABNORMAL LOW
HCT: 30.5 — ABNORMAL LOW
Hemoglobin: 10.5 — ABNORMAL LOW
Hemoglobin: 9.9 — ABNORMAL LOW
MCHC: 34
MCHC: 34.2
MCHC: 34.3
MCV: 91
MCV: 92.1
MCV: 94.5
Platelets: 300
RBC: 3.35 — ABNORMAL LOW
RDW: 13.4
RDW: 13.8

## 2011-07-10 LAB — SAMPLE TO BLOOD BANK

## 2011-07-10 LAB — CULTURE, BLOOD (ROUTINE X 2): Report Status: 11262009

## 2011-07-10 LAB — DIFFERENTIAL
Basophils Relative: 0
Basophils Relative: 0
Eosinophils Absolute: 0
Eosinophils Absolute: 0.2
Eosinophils Relative: 0
Eosinophils Relative: 3
Lymphocytes Relative: 22
Lymphs Abs: 1.6
Monocytes Absolute: 0.8
Monocytes Relative: 6
Monocytes Relative: 7
Neutro Abs: 9.3 — ABNORMAL HIGH
Neutrophils Relative %: 67
Neutrophils Relative %: 94 — ABNORMAL HIGH

## 2011-07-10 LAB — URINALYSIS, ROUTINE W REFLEX MICROSCOPIC
Glucose, UA: NEGATIVE
Nitrite: NEGATIVE
Specific Gravity, Urine: 1.02
pH: 5.5

## 2011-07-10 LAB — URINE CULTURE
Colony Count: NO GROWTH
Culture: NO GROWTH

## 2011-07-10 LAB — HEMOGLOBIN AND HEMATOCRIT, BLOOD
HCT: 24.1 — ABNORMAL LOW
HCT: 25.3 — ABNORMAL LOW
HCT: 30.5 — ABNORMAL LOW
Hemoglobin: 10.3 — ABNORMAL LOW
Hemoglobin: 8.1 — ABNORMAL LOW
Hemoglobin: 8.6 — ABNORMAL LOW

## 2011-07-10 LAB — COMPREHENSIVE METABOLIC PANEL
ALT: 16
Calcium: 9.6
Creatinine, Ser: 1.05
GFR calc Af Amer: 60 — ABNORMAL LOW
Glucose, Bld: 87
Sodium: 140
Total Protein: 7.7

## 2011-07-26 NOTE — H&P (Signed)
Chelsea [name], Chelsea [name] NO.:  [id number]  MEDICAL RECORD NO.:  [id number]  LOCATION:  [facility]                         FACILITY:  [facility]  PHYSICIAN:  Valetta Close, M.D.   DATE OF BIRTH:  12-08-1918  DATE OF ADMISSION:  05/17/2011 DATE OF DISCHARGE:                             HISTORY & PHYSICAL   REASON FOR ADMISSION:  Right leg cellulitis.  HISTORY OF PRESENT ILLNESS:  This is an 77 year old female with a past medical history of hypertension, diabetes, but she is not on any medications who comes in with 2-3 months of increased inflammation, swelling, and open sores of her right leg.  She says she has not seen anyone for this, but at times, she has had ointments put onto it, she has had something similar in the past when she had a hip surgery, but it got better and because she has gotten so bad, she was brought here to the hospital for treatment of this and while she is her, her blood pressures had been in the 230s.  PAST MEDICAL HISTORY:  Hypertension, arthritis, parotitis, chronic pain, peptic ulcer disease secondary to NSAIDs, status post hysterectomy, depression, question mild diabetes, hyperlipidemia, cholelithiasis, she is very nearly deaf and she tells me that someone has told her she had ovarian tumors 3 months ago, but that they go away on their own, so I am not sure if they are really ovarian tumors, I am not sure what they were.  FAMILY HISTORY:  Performed, but she said that there really is not any.  SOCIAL HISTORY:  No alcohol, no tobacco, no drugs.  She now lives with her son who has diabetes and does not see well.  I tried to contact him, but was unsuccessful.  ALLERGIES:  She has an allergy to Azor which is a combination of calcium channel blocker and ARB.  MEDICATIONS:  She says she takes something for her bowels.  REVIEW OF SYSTEMS:  The 10-point review of system was performed and notable for the fact that she does not ambulate around the  house.  She gets by with a wheelchair and she does have constipation occasionally.  PHYSICAL EXAMINATION:  VITAL SIGNS:  Temperature 98.8, heart rate 77, blood pressure 231/92, respiratory rate 20, and O2 sats normal.  She is a full code. GENERAL:  She appears her stated age and is in no apparent distress. SKIN:  Cool, dry, and otherwise unremarkable except for right leg, which is bandaged. LUNGS:  Clear to auscultation bilaterally. CARDIAC:  Regular rate and rhythm with no murmur. ABDOMEN:  Bowel sounds positive.  No tenderness, rebound, or guarding. RECTAL:  Deferred. EXTREMITIES:  Bandaged right lower extremity with good dorsal pedal pulses bilaterally.  Her left leg is unaffected. NEUROLOGIC:  She is nearly deaf.  We have to repeat everything we say and even her answers are somewhat tangential.  She tends to jump from 1 topic to another, but she is oriented x3.  LABORATORY DATA:  White count 18, hemoglobin 12, hematocrit 36, and platelets 201.  MCV 89.  Sodium 138, potassium 4, chloride 101, bicarb 27, BUN 17, creatinine 0.75, glucose 122, and calcium 10.  IMAGING DATA:  X-ray of the legs were negative for osteomyelitis.  IMPRESSION: 1. This looks like cellulitis.  I will treat with IV vancomycin and     Invanz.  We are getting a wound care specialist to see her and     based on what they do with the wound, we will determine her     disposition.  I think she would be able to get off the IV     antibiotics and transition to maybe Keflex and doxycycline, unless     her MRSA screen is negative, then Keflex alone may be effective.     Hopefully, she will not need to be in the hospital too long. 2. Hypertensive urgency.  I think her blood pressure is always quite     elevated.  I do not think this is anything new, so I am not going     to pursue it aggressively.  I will give her p.r.n. clonidine and     will add HCTZ because of her other allergies and we will keep it to     that  for now. 3. Question history of ovarian tumors.  I really need to get some old     records before I just go ahead and scan her.  I want to know     whether these are fibroids of cyst or whether it is nothing and she     is just not giving me the right information, but I am not going to     go ahead and get a CT scan at this time.  But certainly, we need to     follow up on.  Hopefully, we will be able to get in contact with     the family member at some point. 4. History of mild diabetes.  We will check her hemoglobin A1c and     check her sugars, but hold off on coverage for now.  Time spent on this admission was approximately 40 minutes.     Valetta Close, M.D.     JC/MEDQ  D:  05/17/2011  T:  05/17/2011  Job:  [id number]  Electronically Signed by Valetta Close M.D. on 07/26/2011 02:37:16 PM

## 2013-01-13 ENCOUNTER — Non-Acute Institutional Stay (SKILLED_NURSING_FACILITY): Payer: Medicare Other | Admitting: Adult Health

## 2013-01-13 ENCOUNTER — Encounter: Payer: Self-pay | Admitting: Adult Health

## 2013-01-13 DIAGNOSIS — K5900 Constipation, unspecified: Secondary | ICD-10-CM

## 2013-01-13 DIAGNOSIS — E785 Hyperlipidemia, unspecified: Secondary | ICD-10-CM | POA: Insufficient documentation

## 2013-01-13 DIAGNOSIS — K5909 Other constipation: Secondary | ICD-10-CM | POA: Insufficient documentation

## 2013-01-13 DIAGNOSIS — M1990 Unspecified osteoarthritis, unspecified site: Secondary | ICD-10-CM | POA: Insufficient documentation

## 2013-01-13 DIAGNOSIS — I10 Essential (primary) hypertension: Secondary | ICD-10-CM

## 2013-01-13 DIAGNOSIS — E119 Type 2 diabetes mellitus without complications: Secondary | ICD-10-CM | POA: Insufficient documentation

## 2013-01-13 DIAGNOSIS — K279 Peptic ulcer, site unspecified, unspecified as acute or chronic, without hemorrhage or perforation: Secondary | ICD-10-CM

## 2013-01-13 HISTORY — DX: Essential (primary) hypertension: I10

## 2013-01-13 NOTE — Assessment & Plan Note (Signed)
She will decline labs; is taking lipitor 40 mg daily due to advanced age; she does not need this medication

## 2013-01-13 NOTE — Assessment & Plan Note (Signed)
She is stable is taking zantac 150 mg daily as needed

## 2013-01-13 NOTE — Assessment & Plan Note (Addendum)
Is stable is taking miralax daily takes senna s two tabs daily

## 2013-01-13 NOTE — Progress Notes (Signed)
Patient ID: Chelsea [name], female   DOB: September 18, 1919, 79 y.o.   MRN: [id number]  Chief Complaint  Patient presents with  . Medical Managment of Chronic Issues    HPI:  Essential hypertension, benign She is stable is taking norvasc 10 mg daily takes hctz 25 mg daily takes lisinopril 40 mg daily   PUD (peptic ulcer disease) She is stable is taking zantac 150 mg daily as needed   Unspecified constipation Is stable is taking miralax daily takes senna s two tabs daily   Type II or unspecified type diabetes mellitus without mention of complication, uncontrolled She is stable is not taking any medications at this time.   Osteoarthritis Is doing well is taking bengay to neck tid prn.   Other and unspecified hyperlipidemia She will decline labs; is taking lipitor 40 mg daily due to advanced age; she does not need this medication   Past Medical History  Diagnosis Date  . Diabetes mellitus without complication   . Hyperlipidemia   . Hypertension   . PUD (peptic ulcer disease)   . Constipation   . Osteoarthritis   . Chronic pain   . Cholelithiasis     Past Surgical History  Procedure Laterality Date  . Abdominal hysterectomy    . Cystoplasty    . Right hip surgery      VITAL SIGNS BP 124/63  Pulse 72  Ht 6\' 2"  (1.88 m)  Wt 151 lb (68.493 kg)  BMI 19.38 kg/m2   Patient's Medications  New Prescriptions   No medications on file  Previous Medications   AMLODIPINE (NORVASC) 10 MG TABLET    Take 10 mg by mouth daily.   ATORVASTATIN (LIPITOR) 40 MG TABLET    Take 40 mg by mouth daily.   HYDROCHLOROTHIAZIDE (HYDRODIURIL) 25 MG TABLET    Take 25 mg by mouth daily.   LISINOPRIL (PRINIVIL,ZESTRIL) 40 MG TABLET    Take 40 mg by mouth daily.   MENTHOL-METHYL SALICYLATE (BENGAY GREASELESS EX)    Apply 1 application topically 3 (three) times daily as needed. Apply to neck three times daily as needed   MULTIPLE VITAMIN (MULTIVITAMIN) TABLET    Take 1 tablet by mouth daily.   POLYETHYLENE GLYCOL (MIRALAX / GLYCOLAX) PACKET    Take 17 g by mouth daily.   RANITIDINE (ZANTAC) 150 MG TABLET    Take 150 mg by mouth daily as needed for heartburn.   SENNOSIDES-DOCUSATE SODIUM (SENOKOT-S) 8.6-50 MG TABLET    Take 2 tablets by mouth daily.   TRAMADOL (ULTRAM) 50 MG TABLET    Take 50 mg by mouth every 8 (eight) hours as needed for pain.  Modified Medications   No medications on file  Discontinued Medications   No medications on file    SIGNIFICANT DIAGNOSTIC EXAMS None since 2012;  SHE WILL DECLINE ALL LAB WORK.   Review of Systems  Constitutional: Negative for malaise/fatigue.  HENT: Negative for neck pain.   Respiratory: Negative for cough and shortness of breath.   Cardiovascular: Negative for chest pain, palpitations and leg swelling.  Gastrointestinal: Negative for heartburn, abdominal pain and constipation.  Musculoskeletal: Negative for myalgias and joint pain.  Skin: Negative.   Psychiatric/Behavioral: Negative.      Physical Exam  Constitutional: She appears well-developed and well-nourished.  Neck: Neck supple.  Cardiovascular: Normal rate, regular rhythm and intact distal pulses.   Respiratory: Effort normal and breath sounds normal.  GI: Soft. Bowel sounds are normal.  Musculoskeletal: Normal range of motion.  Uses wheelchair  Neurological: She is alert.  Oriented to self  Skin: Skin is warm and dry.  Psychiatric: She has a normal mood and affect.    ASSESSMENT/ PLAN: Hypertension; diabetes; dyslipidemia; pud; constipation; osteoarthritis.   FUTURE ORDERS:  Will stop the lipitor at this time due to her advanced age; will stop the lisinopril at this time; will continue to monitor her blood pressure daily and will make further changes as indicated and will monitor her status

## 2013-01-13 NOTE — Assessment & Plan Note (Signed)
She is stable is not taking any medications at this time.

## 2013-01-13 NOTE — Assessment & Plan Note (Signed)
Is doing well is taking bengay to neck tid prn.

## 2013-01-13 NOTE — Assessment & Plan Note (Signed)
She is stable is taking norvasc 10 mg daily takes hctz 25 mg daily takes lisinopril 40 mg daily

## 2013-03-10 ENCOUNTER — Non-Acute Institutional Stay (SKILLED_NURSING_FACILITY): Payer: Medicare Other | Admitting: Adult Health

## 2013-03-10 DIAGNOSIS — K279 Peptic ulcer, site unspecified, unspecified as acute or chronic, without hemorrhage or perforation: Secondary | ICD-10-CM

## 2013-03-10 DIAGNOSIS — M1990 Unspecified osteoarthritis, unspecified site: Secondary | ICD-10-CM

## 2013-03-10 DIAGNOSIS — K5900 Constipation, unspecified: Secondary | ICD-10-CM

## 2013-03-10 DIAGNOSIS — I10 Essential (primary) hypertension: Secondary | ICD-10-CM

## 2013-08-18 ENCOUNTER — Encounter: Payer: Self-pay | Admitting: Adult Health

## 2013-08-18 NOTE — Progress Notes (Signed)
Patient ID: Chelsea [name], female   DOB: 1919/02/13, 79 y.o.   MRN: [id number]  [name]  No Known Allergies   Chief Complaint  Patient presents with  . Medical Managment of Chronic Issues    HPI: She is being seen for the management of the chronic illnesses. There are no concerns being voiced by the nursing staff. She will not allow for blood draws. There is no significant change in her status over the recent past.   Past Medical History  Diagnosis Date  . Diabetes mellitus without complication   . Hyperlipidemia   . Hypertension   . PUD (peptic ulcer disease)   . Constipation   . Osteoarthritis   . Chronic pain   . Cholelithiasis     Past Surgical History  Procedure Laterality Date  . Abdominal hysterectomy    . Cystoplasty    . Right hip surgery      VITAL SIGNS BP 132/64  Pulse 68  Ht 5\' 4"  (1.626 m)  Wt 151 lb (68.493 kg)  BMI 25.91 kg/m2   Patient's Medications  New Prescriptions   No medications on file  Previous Medications   AMLODIPINE (NORVASC) 10 MG TABLET    Take 10 mg by mouth daily.   HYDROCHLOROTHIAZIDE (HYDRODIURIL) 25 MG TABLET    Take 25 mg by mouth daily.   MENTHOL-METHYL SALICYLATE (BENGAY GREASELESS EX)    Apply 1 application topically 3 (three) times daily as needed. Apply to neck three times daily as needed   MULTIPLE VITAMIN (MULTIVITAMIN) TABLET    Take 1 tablet by mouth daily.   POLYETHYLENE GLYCOL (MIRALAX / GLYCOLAX) PACKET    Take 17 g by mouth daily as needed.    RANITIDINE (ZANTAC) 150 MG TABLET    Take 150 mg by mouth daily as needed for heartburn.   SENNOSIDES-DOCUSATE SODIUM (SENOKOT-S) 8.6-50 MG TABLET    Take 2 tablets by mouth daily.   TRAMADOL (ULTRAM) 50 MG TABLET    Take 50 mg by mouth every 8 (eight) hours as needed for pain.  Modified Medications   No medications on file  Discontinued Medications   ATORVASTATIN (LIPITOR) 40 MG TABLET    Take 40 mg by mouth daily.   LISINOPRIL (PRINIVIL,ZESTRIL) 40 MG TABLET    Take 40  mg by mouth daily.       SIGNIFICANT DIAGNOSTIC EXAMS None since 2012;  SHE WILL DECLINE ALL LAB WORK.   Review of Systems  Constitutional: Negative for malaise/fatigue.  HENT: Negative for neck pain.   Respiratory: Negative for cough and shortness of breath.   Cardiovascular: Negative for chest pain, palpitations and leg swelling.  Gastrointestinal: Negative for heartburn, abdominal pain and constipation.  Musculoskeletal: Negative for myalgias and joint pain.  Skin: Negative.   Psychiatric/Behavioral: Negative.      Physical Exam  Constitutional: She appears well-developed and well-nourished.  Neck: Neck supple.  Cardiovascular: Normal rate, regular rhythm and intact distal pulses.   Respiratory: Effort normal and breath sounds normal.  GI: Soft. Bowel sounds are normal.  Musculoskeletal: Normal range of motion.  Uses wheelchair  Neurological: She is alert.  Oriented to self  Skin: Skin is warm and dry.  Psychiatric: She has a normal mood and affect.    ASSESSMENT/ PLAN:  1. Hypertension: is stable will continue hctz 25 mg daily and norvasc 10 mg daily and will continue to monitor her status   2. Pud: is stable no signs of gastric distress present will continue zantac 150  mg daily as needed  3. Constipation: will continue miralax daily as needed ans senna s 2 tabs daily   4. Osteoarthritis; will continue her bengay to her neck as needed; ans ultram 50 mg every 8 hours as needed and will monitor

## 2013-08-24 ENCOUNTER — Encounter: Payer: Self-pay | Admitting: Nurse Practitioner

## 2013-08-24 ENCOUNTER — Non-Acute Institutional Stay (SKILLED_NURSING_FACILITY): Payer: Medicare Other | Admitting: Nurse Practitioner

## 2013-08-24 DIAGNOSIS — M1990 Unspecified osteoarthritis, unspecified site: Secondary | ICD-10-CM

## 2013-08-24 DIAGNOSIS — I10 Essential (primary) hypertension: Secondary | ICD-10-CM

## 2013-08-24 DIAGNOSIS — K5900 Constipation, unspecified: Secondary | ICD-10-CM

## 2013-08-24 DIAGNOSIS — B372 Candidiasis of skin and nail: Secondary | ICD-10-CM

## 2013-08-24 NOTE — Progress Notes (Signed)
Patient ID: Chelsea [name], female   DOB: [birth year], 79 y.o.   MRN: [id number] Nursing Home Location:  [name]   Place of Service: SNF (31)   No Known Allergies  Chief Complaint  Patient presents with  . Medical Managment of Chronic Issues    HPI:  79 year old with a PHM of diabetes, hyperlipidemia, hth, constipation, arthritis, is being seen today for routine follow up. Pt is being followed by treatment nurse due to yeast rash to under bilateral breast and groin; there has been no changes in the last month with pt and pt without any complaints at this time and nursing without any concerns.   Review of Systems:  Review of Systems  Constitutional: Negative for fever, chills and malaise/fatigue.  Eyes: Negative.   Respiratory: Negative for cough and shortness of breath.   Cardiovascular: Negative for chest pain and leg swelling.  Gastrointestinal: Negative for heartburn, abdominal pain and constipation.  Genitourinary: Negative for dysuria.  Musculoskeletal: Negative for joint pain, myalgias and neck pain.  Skin: Positive for itching and rash.  Neurological: Negative for headaches.  Psychiatric/Behavioral: Positive for memory loss.     Past Medical History  Diagnosis Date  . Diabetes mellitus without complication   . Hyperlipidemia   . Hypertension   . PUD (peptic ulcer disease)   . Constipation   . Osteoarthritis   . Chronic pain   . Cholelithiasis    Past Surgical History  Procedure Laterality Date  . Abdominal hysterectomy    . Cystoplasty    . Right hip surgery     Social History:   reports that she has never smoked. She does not have any smokeless tobacco history on file. She reports that she does not drink alcohol or use illicit drugs.  No family history on file.  Medications: Patient's Medications  New Prescriptions   No medications on file  Previous Medications   ACETAMINOPHEN (TYLENOL) 325 MG TABLET    Take 650 mg by mouth every 4  (four) hours as needed.   AMLODIPINE (NORVASC) 10 MG TABLET    Take 10 mg by mouth daily.   HYDROCHLOROTHIAZIDE (HYDRODIURIL) 25 MG TABLET    Take 25 mg by mouth daily.   MENTHOL-METHYL SALICYLATE (BENGAY GREASELESS EX)    Apply 1 application topically 3 (three) times daily as needed. Apply to neck three times daily as needed   MULTIPLE VITAMIN (MULTIVITAMIN) TABLET    Take 1 tablet by mouth daily.   POLYETHYLENE GLYCOL (MIRALAX / GLYCOLAX) PACKET    Take 17 g by mouth daily as needed.    SENNOSIDES-DOCUSATE SODIUM (SENOKOT-S) 8.6-50 MG TABLET    Take 2 tablets by mouth daily.  Modified Medications   No medications on file  Discontinued Medications   RANITIDINE (ZANTAC) 150 MG TABLET    Take 150 mg by mouth daily as needed for heartburn.   TRAMADOL (ULTRAM) 50 MG TABLET    Take 50 mg by mouth every 8 (eight) hours as needed for pain.     Physical Exam:  Filed Vitals:   08/24/13 1256  BP: 117/63  Pulse: 62  Temp: 97.4 F (36.3 C)  Resp: 20  SpO2: 98%   Physical Exam  Constitutional: She is well-developed, well-nourished, and in no distress. No distress.  HENT:  Mouth/Throat: Oropharynx is clear and moist. No oropharyngeal exudate.  Eyes: Conjunctivae and EOM are normal. Pupils are equal, round, and reactive to light.  Neck: Normal range of motion. Neck  supple.  Cardiovascular: Normal rate, regular rhythm and normal heart sounds.   Pulmonary/Chest: Effort normal and breath sounds normal. No respiratory distress.  Abdominal: Soft. Bowel sounds are normal. She exhibits no distension.  Musculoskeletal: Normal range of motion. She exhibits no edema and no tenderness.  Neurological: She is alert.  forgetful  Skin: Skin is warm and dry. Rash noted. She is not diaphoretic.  Yeast rash to under breast bilaterally  Psychiatric: Affect normal.     Labs reviewed: Pt declines blood work frequently; no recent labs   Assessment/Plan 1. Essential hypertension, benign Stable; pt on  norvasc and HCTZ; will attempt to have staff draw cmp at this time, if she declines this and bp stay in appropriate range would consider stopping HCTZ  2. Unspecified constipation Patient is stable; continue current regimen. Will monitor and make changes as necessary.  3. Type II or unspecified type diabetes mellitus without mention of complication, uncontrolled Currently off all medication  4. Osteoarthritis Stable on current medication  5. Yeast infection of the skin Being followed by treatment nurse and getting nystatin daily with improvement    Labs/tests ordered CMP, cbc

## 2013-10-07 ENCOUNTER — Non-Acute Institutional Stay (SKILLED_NURSING_FACILITY): Payer: Medicare Other | Admitting: Nurse Practitioner

## 2013-10-07 DIAGNOSIS — M1990 Unspecified osteoarthritis, unspecified site: Secondary | ICD-10-CM

## 2013-10-07 DIAGNOSIS — K5900 Constipation, unspecified: Secondary | ICD-10-CM

## 2013-10-07 DIAGNOSIS — I10 Essential (primary) hypertension: Secondary | ICD-10-CM

## 2013-10-07 NOTE — Progress Notes (Signed)
Patient ID: Chelsea [name], female   DOB: Mar 11, 1919, 79 y.o.   MRN: [id number]    Nursing Home Location:  [name]   Place of Service: SNF (31)  PCP: No primary provider on file.  No Known Allergies  Chief Complaint  Patient presents with  . Medical Managment of Chronic Issues    HPI:  79 year old with a PHM of diabetes, hyperlipidemia, hth, constipation, arthritis, is being seen today for routine follow up of chronic conditions  there has been no changes in the last month with pt and pt without any complaints at this time and nursing without any concerns.  Yeast rash has improved; using treatments as needed at this time.   Review of Systems:  Review of Systems  Constitutional: Negative for fever, chills and malaise/fatigue.  Eyes: Negative.   Respiratory: Negative for cough and shortness of breath.   Cardiovascular: Negative for chest pain and leg swelling.  Gastrointestinal: Negative for heartburn, abdominal pain and constipation.  Genitourinary: Negative for dysuria.  Musculoskeletal: Negative for joint pain, myalgias and neck pain.  Skin: Negative.   Neurological: Negative for headaches.  Psychiatric/Behavioral: Positive for memory loss.     Past Medical History  Diagnosis Date  . Diabetes mellitus without complication   . Hyperlipidemia   . Hypertension   . PUD (peptic ulcer disease)   . Constipation   . Osteoarthritis   . Chronic pain   . Cholelithiasis    Past Surgical History  Procedure Laterality Date  . Abdominal hysterectomy    . Cystoplasty    . Right hip surgery     Social History:   reports that she has never smoked. She does not have any smokeless tobacco history on file. She reports that she does not drink alcohol or use illicit drugs.  No family history on file.  Medications: Patient's Medications  New Prescriptions   No medications on file  Previous Medications   ACETAMINOPHEN (TYLENOL) 325 MG TABLET    Take 650 mg  by mouth every 4 (four) hours as needed.   AMLODIPINE (NORVASC) 10 MG TABLET    Take 10 mg by mouth daily.   HYDROCHLOROTHIAZIDE (HYDRODIURIL) 25 MG TABLET    Take 25 mg by mouth daily.   MENTHOL-METHYL SALICYLATE (BENGAY GREASELESS EX)    Apply 1 application topically 3 (three) times daily as needed. Apply to neck three times daily as needed   MULTIPLE VITAMIN (MULTIVITAMIN) TABLET    Take 1 tablet by mouth daily.   POLYETHYLENE GLYCOL (MIRALAX / GLYCOLAX) PACKET    Take 17 g by mouth daily as needed.    SENNOSIDES-DOCUSATE SODIUM (SENOKOT-S) 8.6-50 MG TABLET    Take 2 tablets by mouth daily.  Modified Medications   No medications on file  Discontinued Medications   No medications on file     Physical Exam: Physical Exam  Constitutional: She is well-developed, well-nourished, and in no distress. No distress.  HENT:  Mouth/Throat: Oropharynx is clear and moist. No oropharyngeal exudate.  Eyes: Conjunctivae and EOM are normal. Pupils are equal, round, and reactive to light.  Neck: Normal range of motion. Neck supple.  Cardiovascular: Normal rate and regular rhythm.   Murmur heard. Pulmonary/Chest: Effort normal and breath sounds normal. No respiratory distress.  Abdominal: Soft. Bowel sounds are normal. She exhibits no distension.  Musculoskeletal: Normal range of motion. She exhibits no edema and no tenderness.  Neurological: She is alert.  Skin: Skin is warm and dry. She  is not diaphoretic.  Psychiatric: Affect normal.    Filed Vitals:   10/07/13 1248  BP: 126/70  Pulse: 79  Temp: 98.3 F (36.8 C)  Resp: 20  SpO2: 93%      Labs reviewed: 02/2012 sodium 141, potassium 4.7, BUN 52, Cr 2.04, Glucose 93 hgb A1c 5.5 08/25/13 wbc 7.2, rbc  3.13, hgb 9.3, hct 28.3, rdw 12.2, plts 225 Sodium 138, potassium 4.7, Glucose 101, BUN 54, Cr 2.31   Assessment/Plan 1. Unspecified constipation -no complaints of constipation, will cont senokot at this time  2. Essential  hypertension, benign -will cont norvasc and HCTZ -lab work stable from 02/2012 -blood pressure at [facility]  3. Osteoarthritis -stable on current medications  4. Type II or unspecified type diabetes mellitus without mention of complication, uncontrolled -diet controlled off all medications

## 2013-11-19 ENCOUNTER — Non-Acute Institutional Stay (SKILLED_NURSING_FACILITY): Payer: Medicare Other | Admitting: Internal Medicine

## 2013-11-19 ENCOUNTER — Encounter: Payer: Self-pay | Admitting: Internal Medicine

## 2013-11-19 DIAGNOSIS — M1990 Unspecified osteoarthritis, unspecified site: Secondary | ICD-10-CM

## 2013-11-19 DIAGNOSIS — K5900 Constipation, unspecified: Secondary | ICD-10-CM

## 2013-11-19 DIAGNOSIS — K279 Peptic ulcer, site unspecified, unspecified as acute or chronic, without hemorrhage or perforation: Secondary | ICD-10-CM

## 2013-11-19 DIAGNOSIS — E1165 Type 2 diabetes mellitus with hyperglycemia: Secondary | ICD-10-CM

## 2013-11-19 DIAGNOSIS — I10 Essential (primary) hypertension: Secondary | ICD-10-CM

## 2013-11-19 DIAGNOSIS — N184 Chronic kidney disease, stage 4 (severe): Secondary | ICD-10-CM | POA: Insufficient documentation

## 2013-11-19 DIAGNOSIS — IMO0001 Reserved for inherently not codable concepts without codable children: Secondary | ICD-10-CM

## 2013-11-19 DIAGNOSIS — D649 Anemia, unspecified: Secondary | ICD-10-CM

## 2013-11-19 NOTE — Progress Notes (Signed)
MRN: [id number] Name: Chelsea [name]  Sex: female Age: 80 y.o. DOB: 08-05-1919  PSC #: Ronni Rumble Facility/Room: 228B Level Of Care: SNF Provider: Merrilee Seashore D Emergency Contacts: Extended Emergency Contact Information Primary Emergency Contact: Heavrin,[name] Address: [address]          [place], [place] Home Phone: [phone] Relation: Other  Code Status: FULL  Allergies: Review of patient's allergies indicates no known allergies.  Chief Complaint  Patient presents with  . Medical Managment of Chronic Issues    HPI: Patient is 80 y.o. female who is being seen for routine problems.  Past Medical History  Diagnosis Date  . Diabetes mellitus without complication   . Hyperlipidemia   . Hypertension   . PUD (peptic ulcer disease)   . Constipation   . Osteoarthritis   . Chronic pain   . Cholelithiasis     Past Surgical History  Procedure Laterality Date  . Abdominal hysterectomy    . Cystoplasty    . Right hip surgery        Medication List       This list is accurate as of: 11/19/13  4:40 PM.  Always use your most recent med list.               acetaminophen 325 MG tablet  Commonly known as:  TYLENOL  Take 650 mg by mouth every 4 (four) hours as needed.     amLODipine 10 MG tablet  Commonly known as:  NORVASC  Take 10 mg by mouth daily.     BENGAY GREASELESS EX  Apply 1 application topically 3 (three) times daily as needed. Apply to neck three times daily as needed     hydrochlorothiazide 25 MG tablet  Commonly known as:  HYDRODIURIL  Take 25 mg by mouth daily.     multivitamin tablet  Take 1 tablet by mouth daily.     polyethylene glycol packet  Commonly known as:  MIRALAX / GLYCOLAX  Take 17 g by mouth daily as needed.     sennosides-docusate sodium 8.6-50 MG tablet  Commonly known as:  SENOKOT-S  Take 2 tablets by mouth daily.        No orders of the defined types were placed in this encounter.    Immunization History   Administered Date(s) Administered  . Influenza Whole 07/20/2013  . Pneumococcal-Unspecified 05/17/2011    History  Substance Use Topics  . Smoking status: Never Smoker   . Smokeless tobacco: Not on file  . Alcohol Use: No    Review of Systems  DATA OBTAINED: from patient; with dementia;c/o only of itching on abdomen GENERAL: Feels well no fevers, fatigue, appetite changes SKIN: itching abd HEENT: No complaint RESPIRATORY: No cough, wheezing, SOB CARDIAC: No chest pain, palpitations, lower extremity edema  GI: No abdominal pain, No N/V/D or constipation, No heartburn or reflux  GU: No dysuria, frequency or urgency, or incontinence  MUSCULOSKELETAL: No unrelieved bone/joint pain NEUROLOGIC: No headache, dizziness or focal weakness PSYCHIATRIC: No overt anxiety or sadness. Sleeps well.   Filed Vitals:   11/19/13 1624  BP: 117/56  Pulse: 74  Temp: 98.2 F (36.8 C)  Resp: 20    Physical Exam  GENERAL APPEARANCE: Alert, conversant. Appropriately groomed. No acute distress  SKIN: No diaphoresis; mild dryness few places on abd, NOT like yeast HEENT: Unremarkable RESPIRATORY: Breathing is even, unlabored. Lung sounds are clear   CARDIOVASCULAR: Heart RRR 4/6 systolic murmur; no rubs or gallops. No peripheral edema  GASTROINTESTINAL: Abdomen is soft, non-tender, not distended w/ normal bowel sounds.  GENITOURINARY: Bladder non tender, not distended  MUSCULOSKELETAL: No abnormal joints or musculature NEUROLOGIC: Cranial nerves 2-12 grossly intact. Moves all extremities no tremor. PSYCHIATRIC: pleasant , dementia, no behavioral issues  Patient Active Problem List   Diagnosis Date Noted  . Chronic renal disease, stage 4, severely decreased glomerular filtration rate between 15-29 mL/min/1.73 square meter 11/19/2013  . Anemia 11/19/2013  . Type II or unspecified type diabetes mellitus without mention of complication, uncontrolled 01/13/2013  . Other and unspecified  hyperlipidemia 01/13/2013  . Essential hypertension, benign 01/13/2013  . PUD (peptic ulcer disease) 01/13/2013  . Unspecified constipation 01/13/2013  . Osteoarthritis 01/13/2013       Assessment and Plan  Essential hypertension, benign Controlled on Norvasc and HCTZ only; no longer on Lisinopril  PUD (peptic ulcer disease) No c/o and is on no meds for it  Type II or unspecified type diabetes mellitus without mention of complication, uncontrolled Last HbA1c was 5.5 in 02/2012 so no longer diabetic  Osteoarthritis Pt uses bengay and tylenol with relief  Unspecified constipation  Takes mirilax and senokot-S and has no c/o  Chronic renal disease, stage 4, severely decreased glomerular filtration rate between 15-29 mL/min/1.73 square meter Last GFR was 08/25/2013 was 21; BUN 54/Cr 2.31 which is stable  Anemia H/H 9.3 /28.3 on 08/25/2013 MCV 90.5; will do anemia panel and recheck    Margit HanksALEXANDER, ANNE D, MD

## 2013-11-19 NOTE — Assessment & Plan Note (Signed)
Last HbA1c was 5.5 in 02/2012 so no longer diabetic

## 2013-11-19 NOTE — Assessment & Plan Note (Signed)
Pt uses bengay and tylenol with relief

## 2013-11-19 NOTE — Assessment & Plan Note (Signed)
No c/o and is on no meds for it

## 2013-11-19 NOTE — Assessment & Plan Note (Signed)
H/H 9.3 /28.3 on 08/25/2013 MCV 90.5; will do anemia panel and recheck

## 2013-11-19 NOTE — Assessment & Plan Note (Signed)
Last GFR was 08/25/2013 was 21; BUN 54/Cr 2.31 which is stable

## 2013-11-19 NOTE — Assessment & Plan Note (Signed)
Controlled on Norvasc and HCTZ only; no longer on Lisinopril

## 2013-11-19 NOTE — Assessment & Plan Note (Signed)
  Takes mirilax and senokot-S and has no c/o

## 2014-01-27 ENCOUNTER — Non-Acute Institutional Stay (SKILLED_NURSING_FACILITY): Payer: Medicare Other | Admitting: Internal Medicine

## 2014-01-27 ENCOUNTER — Encounter: Payer: Self-pay | Admitting: Internal Medicine

## 2014-01-27 DIAGNOSIS — K5900 Constipation, unspecified: Secondary | ICD-10-CM

## 2014-01-27 DIAGNOSIS — M150 Primary generalized (osteo)arthritis: Secondary | ICD-10-CM

## 2014-01-27 DIAGNOSIS — K279 Peptic ulcer, site unspecified, unspecified as acute or chronic, without hemorrhage or perforation: Secondary | ICD-10-CM

## 2014-01-27 DIAGNOSIS — M159 Polyosteoarthritis, unspecified: Secondary | ICD-10-CM

## 2014-01-27 DIAGNOSIS — N184 Chronic kidney disease, stage 4 (severe): Secondary | ICD-10-CM

## 2014-01-27 DIAGNOSIS — M8949 Other hypertrophic osteoarthropathy, multiple sites: Secondary | ICD-10-CM

## 2014-01-27 DIAGNOSIS — K5909 Other constipation: Secondary | ICD-10-CM

## 2014-01-27 DIAGNOSIS — I10 Essential (primary) hypertension: Secondary | ICD-10-CM

## 2014-01-27 MED ORDER — HYDROCHLOROTHIAZIDE 25 MG PO TABS
12.5000 mg | ORAL_TABLET | Freq: Every day | ORAL | Status: DC
Start: 2014-01-27 — End: 2015-04-20

## 2014-01-27 NOTE — Progress Notes (Signed)
Patient ID: Chelsea [name], female   DOB: 11/14/1918, 80 y.o.   MRN: [id number]  [name] Provider:  Gwenith Spitziffany L. Renato Gailseed, D.O., C.M.D.  Code Status:  DNR  Chief Complaint  Patient presents with  . Medical Management of Chronic Issues    HPI:80 yo white female long term care resident with dementia, oa, htn, constipation seen for routine visit.  She is feeling lightheaded at times.    Review of Systems:  Review of Systems  Constitutional: Negative for fever.  HENT: Positive for hearing loss. Negative for congestion.   Eyes: Negative for blurred vision.       Glasses  Respiratory: Negative for shortness of breath.   Cardiovascular: Negative for chest pain.  Gastrointestinal: Negative for abdominal pain, constipation, blood in stool and melena.  Genitourinary: Positive for frequency.  Musculoskeletal: Negative for falls.  Skin: Negative for rash.  Neurological: Positive for dizziness. Negative for loss of consciousness.  Psychiatric/Behavioral: Positive for memory loss.    Medications: Patient's Medications  New Prescriptions   No medications on file  Previous Medications   ACETAMINOPHEN (TYLENOL) 325 MG TABLET    Take 650 mg by mouth every 4 (four) hours as needed.   AMLODIPINE (NORVASC) 10 MG TABLET    Take 10 mg by mouth daily.   HYDROCHLOROTHIAZIDE (HYDRODIURIL) 25 MG TABLET    Take 25 mg by mouth daily.   MENTHOL-METHYL SALICYLATE (BENGAY GREASELESS EX)    Apply 1 application topically 3 (three) times daily as needed. Apply to neck three times daily as needed   MULTIPLE VITAMIN (MULTIVITAMIN) TABLET    Take 1 tablet by mouth daily.   POLYETHYLENE GLYCOL (MIRALAX / GLYCOLAX) PACKET    Take 17 g by mouth daily as needed.    SENNOSIDES-DOCUSATE SODIUM (SENOKOT-S) 8.6-50 MG TABLET    Take 2 tablets by mouth daily.  Modified Medications   No medications on file  Discontinued Medications   No medications on file    Physical Exam: Filed Vitals:   01/27/14 1247  BP: 104/56  Pulse: 88  Temp: 97.9 F (36.6 C)  Resp: 18  Height: 5\' 4"  (1.626 m)  Weight: 166 lb (75.297 kg)  SpO2: 97%  Physical Exam  Constitutional: She appears well-developed and well-nourished. No distress.  HENT:  Very HOH  Cardiovascular: Normal rate, regular rhythm, normal heart sounds and intact distal pulses.   Pulmonary/Chest: Effort normal and breath sounds normal. No respiratory distress.  Abdominal: Soft. Bowel sounds are normal. She exhibits no distension and no mass. There is no tenderness.  Musculoskeletal: Normal range of motion.  Neurological: She is alert.  Skin: Skin is warm and dry.  Psychiatric: She has a normal mood and affect.     Assessment/Plan 1. Essential hypertension, benign Decrease hctz to 12.5mg  from 25mg  due to dizziness and renal function bp is well controlled esp for her age and goals of care Cont amlodipine  2. PUD (peptic ulcer disease) -off any meds for this, no related complaints, avoid nsaids, anticoagulants  3. Chronic renal disease, stage 4, severely decreased glomerular filtration rate between 15-29 mL/min/1.73 square meter -f/u labs -avoid nsaids and other nephrotoxic agents  4. Primary osteoarthritis involving multiple joints -pain controlled with tylenol and bengay  5.  Chronic constipation:  Cont miralax prn and senna s daily, encourage hydration  Family/ staff Communication: discussed with nurse  Goals of care: full code, long term care resident  Labs/tests ordered:  Cbc, bmp next draw

## 2014-04-21 ENCOUNTER — Encounter: Payer: Self-pay | Admitting: Internal Medicine

## 2014-04-21 ENCOUNTER — Non-Acute Institutional Stay (SKILLED_NURSING_FACILITY): Payer: Medicare Other | Admitting: Internal Medicine

## 2014-04-21 DIAGNOSIS — K5900 Constipation, unspecified: Secondary | ICD-10-CM

## 2014-04-21 DIAGNOSIS — M159 Polyosteoarthritis, unspecified: Secondary | ICD-10-CM

## 2014-04-21 DIAGNOSIS — M150 Primary generalized (osteo)arthritis: Secondary | ICD-10-CM

## 2014-04-21 DIAGNOSIS — K5909 Other constipation: Secondary | ICD-10-CM

## 2014-04-21 DIAGNOSIS — K279 Peptic ulcer, site unspecified, unspecified as acute or chronic, without hemorrhage or perforation: Secondary | ICD-10-CM

## 2014-04-21 DIAGNOSIS — I10 Essential (primary) hypertension: Secondary | ICD-10-CM

## 2014-04-21 DIAGNOSIS — E785 Hyperlipidemia, unspecified: Secondary | ICD-10-CM

## 2014-04-21 DIAGNOSIS — D649 Anemia, unspecified: Secondary | ICD-10-CM

## 2014-04-21 DIAGNOSIS — M8949 Other hypertrophic osteoarthropathy, multiple sites: Secondary | ICD-10-CM

## 2014-04-21 DIAGNOSIS — E119 Type 2 diabetes mellitus without complications: Secondary | ICD-10-CM

## 2014-04-21 DIAGNOSIS — N184 Chronic kidney disease, stage 4 (severe): Secondary | ICD-10-CM

## 2014-04-21 NOTE — Progress Notes (Signed)
Patient ID: Chelsea [name], female   DOB: 08-26-1919, 80 y.o.   MRN: [id number]  Location:  [name] Provider:  Gwenith Spitz. Renato Gails, D.O., C.M.D.  Code Status: Full  Chief Complaint  Patient presents with  . Medical Management of Chronic Issues    HPI:  80 yo white female long term care resident was seen for med mgt of chronic diseases.  She c/o right ankle pain.  There is no swelling, redness, or warmth and no known injury.    Review of Systems:  Review of Systems  Constitutional: Negative for fever and malaise/fatigue.  HENT: Negative for congestion.   Eyes: Positive for blurred vision.  Respiratory: Negative for shortness of breath.   Cardiovascular: Negative for chest pain.  Gastrointestinal: Negative for abdominal pain, constipation, blood in stool and melena.  Genitourinary: Negative for dysuria.  Musculoskeletal: Positive for joint pain. Negative for falls.  Skin: Negative for rash.  Neurological: Negative for dizziness and weakness.  Psychiatric/Behavioral: Positive for memory loss.    Medications: Patient's Medications  New Prescriptions   No medications on file  Previous Medications   ACETAMINOPHEN (TYLENOL) 325 MG TABLET    Take 650 mg by mouth every 4 (four) hours as needed.   AMLODIPINE (NORVASC) 10 MG TABLET    Take 10 mg by mouth daily.   HYDROCHLOROTHIAZIDE (HYDRODIURIL) 25 MG TABLET    Take 0.5 tablets (12.5 mg total) by mouth daily.   MENTHOL-METHYL SALICYLATE (BENGAY GREASELESS EX)    Apply 1 application topically 3 (three) times daily as needed. Apply to neck three times daily as needed   MULTIPLE VITAMIN (MULTIVITAMIN) TABLET    Take 1 tablet by mouth daily.   POLYETHYLENE GLYCOL (MIRALAX / GLYCOLAX) PACKET    Take 17 g by mouth daily as needed.    SENNOSIDES-DOCUSATE SODIUM (SENOKOT-S) 8.6-50 MG TABLET    Take 2 tablets by mouth daily.  Modified Medications   No medications on file  Discontinued Medications   No medications on file     Physical Exam: Filed Vitals:   04/21/14 0949  BP: 124/62  Pulse: 82  Temp: 97.5 F (36.4 C)  Resp: 18  Height: 5\' 4"  (1.626 m)  Weight: 171 lb (77.565 kg)  SpO2: 98%  Physical Exam  Constitutional: She appears well-developed and well-nourished. No distress.  Cardiovascular: Normal rate, regular rhythm, normal heart sounds and intact distal pulses.   Pulmonary/Chest: Effort normal and breath sounds normal. No respiratory distress.  Abdominal: Soft. Bowel sounds are normal. She exhibits no distension and no mass. There is no tenderness.  Musculoskeletal: Normal range of motion. She exhibits tenderness.  Right ankle lateral aspect  Neurological: She is alert.  Skin: Skin is warm and dry.  Psychiatric: She has a normal mood and affect.    Labs reviewed: No recent  Assessment/Plan 1. Chronic renal disease, stage 4, severely decreased glomerular filtration rate between 15-29 mL/min/1.73 square meter -f/u bmp, avoid nsaids and nephrotoxic agents, maintain hydration -would immediately hold hctz if she has poor po intake  2. PUD (peptic ulcer disease) -is no longer on medication for this (PPI was tapered off--made prn and eventually d/cd due to nonuse) -monitor for recurrent symptoms  3. Essential hypertension, benign -bp at goal with hctz and norvasc -cannot take ace with advanced CKD  4. Primary osteoarthritis involving multiple joints -cont tylenol at bedtime, could increase up to 3g per day -also uses bengay on affected joints with benefit -suspect ankle pain is also arthritis, may  have some mild tendonitis---if persists, would image to be sure  5. Chronic constipation -cont miralax daily prn and scheduled 2 senna daily  6. Diabetes mellitus type 2, controlled, without complications -CONCHO diet only at this point -monitor hba1c  7. Hyperlipidemia -cont dietary mgt of cholesterol, f/u flp to see where she stands  8. Anemia, unspecified anemia type -f/u cbc, if  found to be iron deficient, would not prescribe iron more than once per day to avoid worsening her constipation   Family/ staff Communication: discussed with nurse Goals of care: full code, long term care  Labs/tests ordered:  Cbc, cmp, flp, hba1c

## 2014-08-04 ENCOUNTER — Non-Acute Institutional Stay (SKILLED_NURSING_FACILITY): Payer: Medicare Other | Admitting: Internal Medicine

## 2014-08-04 ENCOUNTER — Encounter: Payer: Self-pay | Admitting: Internal Medicine

## 2014-08-04 DIAGNOSIS — K5909 Other constipation: Secondary | ICD-10-CM

## 2014-08-04 DIAGNOSIS — M159 Polyosteoarthritis, unspecified: Secondary | ICD-10-CM

## 2014-08-04 DIAGNOSIS — K5900 Constipation, unspecified: Secondary | ICD-10-CM

## 2014-08-04 DIAGNOSIS — I10 Essential (primary) hypertension: Secondary | ICD-10-CM

## 2014-08-04 DIAGNOSIS — M150 Primary generalized (osteo)arthritis: Secondary | ICD-10-CM

## 2014-08-04 NOTE — Progress Notes (Signed)
Place of Service: [name]  No Known Allergies  Code Status: Full Code  Goals of Care: Longevity/Long term care  Chief Complaint  Patient presents with  . Medical Management of Chronic Issues     HTN, constipation, OA    HPI 80 y.o. female with PMH of senile dementia, HTN, chronic constipation, OA among others is being seen for a routine visit. No complaints verbalized by patient. No falls or skin issues reported. No change in functional status or behaviors. Weight stable. No concerns from staff  Review of Systems Unable to obtain due to dementia.   Past Medical History  Diagnosis Date  . Diabetes mellitus without complication   . Hyperlipidemia   . Hypertension   . PUD (peptic ulcer disease)   . Constipation   . Osteoarthritis   . Chronic pain   . Cholelithiasis     Past Surgical History  Procedure Laterality Date  . Abdominal hysterectomy    . Cystoplasty    . Right hip surgery      History   Social History  . Marital Status: Widowed    Spouse Name: N/A    Number of Children: N/A  . Years of Education: N/A   Occupational History  . Not on file.   Social History Main Topics  . Smoking status: Never Smoker   . Smokeless tobacco: Not on file  . Alcohol Use: No  . Drug Use: No  . Sexual Activity: Not on file   Other Topics Concern  . Not on file   Social History Narrative  . No narrative on file      Medication List       This list is accurate as of: 08/04/14  4:39 PM.  Always use your most recent med list.               acetaminophen 325 MG tablet  Commonly known as:  TYLENOL  Take 650 mg by mouth every 4 (four) hours as needed.     amLODipine 10 MG tablet  Commonly known as:  NORVASC  Take 10 mg by mouth daily.     BENGAY GREASELESS EX  Apply 1 application topically 3 (three) times daily as needed. Apply to neck three times daily as needed     hydrochlorothiazide 25 MG tablet  Commonly known as:   HYDRODIURIL  Take 0.5 tablets (12.5 mg total) by mouth daily.     multivitamin tablet  Take 1 tablet by mouth daily.     polyethylene glycol packet  Commonly known as:  MIRALAX / GLYCOLAX  Take 17 g by mouth daily as needed.     sennosides-docusate sodium 8.6-50 MG tablet  Commonly known as:  SENOKOT-S  Take 2 tablets by mouth daily.        Physical Exam Filed Vitals:   08/04/14 0935  BP: 130/80  Pulse: 67  Temp: 98 F (36.7 C)  Resp: 18   Constitutional: WDWN elderly female in no acute distress.  HEENT: Normocephalic and atraumatic. PERRL. EOM intact. No icterus. No nasal discharge or sinus tenderness. Oral mucosa moist. Posterior pharynx clear of any exudate or lesions.   Neck: Supple and nontender. No lymphadenopathy, masses, or thyromegaly. No JVD or carotid bruits. Cardiac: Normal S1, S2. Soft 1/6 holosystolic mumur. Distal pulses intact. No dependent edema.  Lungs: No respiratory distress. Breath sounds clear bilaterally without rales, rhonchi, or wheezes. Abdomen: Audible bowel sounds in all quadrants. Soft, nontender, nondistended. No palpable mass.  Musculoskeletal: Able to move all four extremities. No joint erythema or tenderness.  Skin: Warm and dry. No rash noted. No erythema.  Neurological: Alert Psychiatric: Appropriate mood and affect.   Labs Reviewed No labs. Pt has been refusing labs  Assessment & Plan 1. Essential hypertension, benign Stable, continue norvasc 10mg  daily and hctz 12.5mg  daily. Continue to monitor.    2. Primary osteoarthritis involving multiple joints Stable. Continue tylenol 650mg  every four hours as needed. Continue to monitor.    3. Chronic constipation Stable. Continue miralax 17g daily PRN and senokot daily. Continue to monitor.    Family/Staff Communication Plan of care discuss professional staff members.  professional staff members verbalize understanding and agree with plan of care. No additional questions or concerns  reported.    Loura BackKim Nguyen, MSN, AGNP-C [facility] [address] [place], [place] [id number] [phone] [8am-5pm] After hours: [phone]

## 2014-09-06 ENCOUNTER — Non-Acute Institutional Stay (SKILLED_NURSING_FACILITY): Payer: Medicare Other | Admitting: Adult Health

## 2014-09-06 ENCOUNTER — Encounter: Payer: Self-pay | Admitting: Adult Health

## 2014-09-06 DIAGNOSIS — M150 Primary generalized (osteo)arthritis: Secondary | ICD-10-CM

## 2014-09-06 DIAGNOSIS — M159 Polyosteoarthritis, unspecified: Secondary | ICD-10-CM

## 2014-09-06 DIAGNOSIS — K5900 Constipation, unspecified: Secondary | ICD-10-CM

## 2014-09-06 DIAGNOSIS — N184 Chronic kidney disease, stage 4 (severe): Secondary | ICD-10-CM

## 2014-09-06 DIAGNOSIS — I10 Essential (primary) hypertension: Secondary | ICD-10-CM

## 2014-09-06 NOTE — Progress Notes (Signed)
Patient ID: Chelsea [name], female   DOB: 06/02/1919, 80 y.o.   MRN: [id number]  [facility]     No Known Allergies     Chief Complaint  Patient presents with  . Medical Management of Chronic Issues    HPI:  She is a long term resident of this facility being seen for the management of her chronic illnesses. She is not voicing any complaints or concerns at this time. There are no concerns being voiced by the nursing staff at this time. She has not been treated for recent infections.     Past Medical History  Diagnosis Date  . Diabetes mellitus without complication   . Hyperlipidemia   . Hypertension   . PUD (peptic ulcer disease)   . Constipation   . Osteoarthritis   . Chronic pain   . Cholelithiasis     Past Surgical History  Procedure Laterality Date  . Abdominal hysterectomy    . Cystoplasty    . Right hip surgery      VITAL SIGNS BP 128/67 mmHg  Pulse 69  Ht 5\' 4"  (1.626 m)  Wt 174 lb (78.926 kg)  BMI 29.85 kg/m2  SpO2 98%   Outpatient Encounter Prescriptions as of 09/06/2014  Medication Sig  . acetaminophen (TYLENOL) 325 MG tablet Take 650 mg by mouth at bedtime.   Marland Kitchen. amLODipine (NORVASC) 10 MG tablet Take 10 mg by mouth daily.  . hydrochlorothiazide (HYDRODIURIL) 25 MG tablet Take 0.5 tablets (12.5 mg total) by mouth daily.  . Menthol-Methyl Salicylate (BENGAY GREASELESS EX) Apply 1 application topically 3 (three) times daily as needed. Apply to neck three times daily as needed  . Multiple Vitamin (MULTIVITAMIN) tablet Take 1 tablet by mouth daily.  . polyethylene glycol (MIRALAX / GLYCOLAX) packet Take 17 g by mouth daily as needed.   . sennosides-docusate sodium (SENOKOT-S) 8.6-50 MG tablet Take 2 tablets by mouth daily.     SIGNIFICANT DIAGNOSTIC EXAMS    LABS REVIEWED: DECLINES LABS  08-30-13: wbc 7.2; hgb 9.3; hct 28.3; mcv 90.5; plt 225; gucose 101; bun 54; creat 2.31; k+4.7; na++138; liver normal albumin 3.7    Review of Systems    Constitutional: Negative for malaise/fatigue.  Respiratory: Negative for cough and shortness of breath.   Cardiovascular: Negative for chest pain, palpitations and leg swelling.  Gastrointestinal: Negative for heartburn, abdominal pain and constipation.  Musculoskeletal: Negative for myalgias and joint pain.  Skin: Negative.   Psychiatric/Behavioral: The patient is not nervous/anxious.      Physical Exam  Constitutional: She appears well-developed and well-nourished. No distress.  Neck: Neck supple. No JVD present. No thyromegaly present.  Cardiovascular: Normal rate, regular rhythm and intact distal pulses.   Respiratory: Effort normal and breath sounds normal. No respiratory distress. She has no wheezes.  GI: Soft. Bowel sounds are normal. She exhibits no distension. There is no tenderness.  Musculoskeletal: She exhibits no edema.  Is able to move all extremities  Neurological: She is alert.  Skin: Skin is warm and dry. She is not diaphoretic.       ASSESSMENT/ PLAN:  1. Hypertension: she is stable will continue norvasc 10 mg daily; hctz 12.5 mg daily. She will not allow any blood work to be drawn  2. Osteoarthritis: she is stable will continue bengay to her feet nightly and tylenol 650 mg nightly   3. Constipation: will continue miralax daily and senna s 2 tabs daily  4. Stage IV CKD: she will not allow for lab work  to be done in order to monitor her status; will not make changes and will monitor her status   5. PUD: she is denying any complaints of GI distress; is presently not on medications. Will monitor     Synthia Innocenteborah Kasiah Manka NP [facility]  Contact [phone] Monday through Friday 8am- 5pm  After hours call [phone]

## 2014-10-09 ENCOUNTER — Encounter: Payer: Self-pay | Admitting: Internal Medicine

## 2014-10-09 ENCOUNTER — Non-Acute Institutional Stay (SKILLED_NURSING_FACILITY): Payer: Medicare Other | Admitting: Internal Medicine

## 2014-10-09 DIAGNOSIS — IMO0001 Reserved for inherently not codable concepts without codable children: Secondary | ICD-10-CM

## 2014-10-09 DIAGNOSIS — L03011 Cellulitis of right finger: Secondary | ICD-10-CM

## 2014-10-09 DIAGNOSIS — M159 Polyosteoarthritis, unspecified: Secondary | ICD-10-CM

## 2014-10-09 DIAGNOSIS — I10 Essential (primary) hypertension: Secondary | ICD-10-CM

## 2014-10-09 DIAGNOSIS — M150 Primary generalized (osteo)arthritis: Secondary | ICD-10-CM

## 2014-10-09 DIAGNOSIS — N184 Chronic kidney disease, stage 4 (severe): Secondary | ICD-10-CM

## 2014-10-09 DIAGNOSIS — E119 Type 2 diabetes mellitus without complications: Secondary | ICD-10-CM

## 2014-10-09 NOTE — Progress Notes (Signed)
Patient ID: Chelsea [name], female   DOB: 1919/02/27, 81 y.o.   MRN: [id number]    [name]  PCP: Elmon Kirschner DO FACOI  Code Status: Full  No Known Allergies  Chief Complaint  Patient presents with  . Medical Management of Chronic Issues    c/o right index finger infected; hx DM II, HTN, stage 4 CKD, OA, hx PUD     HPI:   81 yo female seen today for routine f/u. She c/o 2 day hx infected right index finger at nailbed. Not sure if draining but is red and uncomfortable. No f/c. No known insect bites. She has no other concerns.  No CBGs. No low BS reactions. BS controlled by diet. No numbness or tingling  Review of Systems:  Constitutional: Negative for fever, chills, weight loss, malaise/fatigue and diaphoresis.  HENT: Negative for congestion, hearing loss, earache and sore throat.   Eyes: Negative for eye pain, blurred vision, double vision and discharge.  Respiratory: Negative for cough, sputum production, shortness of breath and wheezing.   Cardiovascular: Negative for chest pain, palpitations, orthopnea and leg swelling.  Gastrointestinal: Negative for heartburn, nausea, vomiting, abdominal pain, melena, rectal bleed, diarrhea and constipation. appetite is  Genitourinary: Negative for dysuria, urgency, frequency, hematuria, nocturia and flank pain.  Musculoskeletal: Negative for falls and myalgias. (+) joint pain Skin: Negative for itching and rash.  Neurological: Negative for weakness,dizziness, tingling, focal weakness and headaches.  Psychiatric/Behavioral: Negative for depression, insomnia and memory loss. The patient is not nervous/anxious.     Past Medical History  Diagnosis Date  . Diabetes mellitus without complication   . Hyperlipidemia   . Hypertension   . PUD (peptic ulcer disease)   . Constipation   . Osteoarthritis   . Chronic pain   . Cholelithiasis    Past Surgical History  Procedure Laterality Date  . Abdominal hysterectomy    .  Cystoplasty    . Right hip surgery     Social History:   reports that she has never smoked. She does not have any smokeless tobacco history on file. She reports that she does not drink alcohol or use illicit drugs.  No family history on file.  Medications: Patient's Medications  New Prescriptions   NYSTATIN (MYCOSTATIN/NYSTOP) 100000 UNIT/GM POWD    Apply under breasts and groin prn redness  Previous Medications   ACETAMINOPHEN (TYLENOL) 325 MG TABLET    Take 650 mg by mouth at bedtime.    AMLODIPINE (NORVASC) 10 MG TABLET    Take 10 mg by mouth daily.   HYDROCHLOROTHIAZIDE (HYDRODIURIL) 25 MG TABLET    Take 0.5 tablets (12.5 mg total) by mouth daily.   MENTHOL-METHYL SALICYLATE (BENGAY GREASELESS EX)    Apply 1 application topically 3 (three) times daily as needed. Apply to neck three times daily as needed   MULTIPLE VITAMIN (MULTIVITAMIN) TABLET    Take 1 tablet by mouth daily.   POLYETHYLENE GLYCOL (MIRALAX / GLYCOLAX) PACKET    Take 17 g by mouth daily as needed.    SENNOSIDES-DOCUSATE SODIUM (SENOKOT-S) 8.6-50 MG TABLET    Take 2 tablets by mouth daily.  Modified Medications   No medications on file  Discontinued Medications   No medications on file     Physical Exam: CONSTITUTIONAL: Looks well in NAD. Awake, alert and oriented x 3 HEENT: PERRLA. Oropharynx clear and without exudate NECK: Supple. Nontender. No palpable cervical or supraclavicular lymph nodes. No carotid bruit b/l. No thyromegaly or thyroid mass  palpable.  CVS: Regular rate with 1/6 SEmurmur. No gallop or rub. LUNGS: CTA b/l no wheezing, rales or rhonchi. ABDOMEN: Bowel sounds present x 4. Soft, nontender, nondistended. No palpable mass or bruit EXTREMITIES: No edema b/l. Distal pulses palpable. No calf tenderness PSYCH: Affect, behavior and mood normal SKIN: right index fingernail paronychia with no d/c but TTP, red and swollen. Nail intact  Filed Vitals:   10/04/14 1151  BP: 134/61  Pulse: 84  Temp:  97.5 F (36.4 C)  Weight: 174 lb (78.926 kg)  SpO2: 99%        Labs reviewed: Basic Metabolic Panel: No results for input(s): NA, K, CL, CO2, GLUCOSE, BUN, CREATININE, CALCIUM, MG, PHOS in the last 8760 hours. Liver Function Tests: No results for input(s): AST, ALT, ALKPHOS, BILITOT, PROT, ALBUMIN in the last 8760 hours. No results for input(s): LIPASE, AMYLASE in the last 8760 hours. No results for input(s): AMMONIA in the last 8760 hours. CBC: No results for input(s): WBC, NEUTROABS, HGB, HCT, MCV, PLT in the last 8760 hours. Cardiac Enzymes: No results for input(s): CKTOTAL, CKMB, CKMBINDEX, TROPONINI in the last 8760 hours. BNP: Invalid input(s): POCBNP CBG: No results for input(s): GLUCAP in the last 8760 hours.   Assessment/Plan   ICD-9-CM ICD-10-CM   1. Paronychia of second finger of right hand 681.02 L03.011   2. Diabetes mellitus type 2, controlled, without complications 250.00 E11.9   3. Essential hypertension, benign 401.1 I10   4. Primary osteoarthritis involving multiple joints 715.09 M15.0   5. Chronic renal disease, stage 4, severely decreased glomerular filtration rate between 15-29 mL/min/1.73 square meter 585.4 N18.4     - right index finger warm epsom salt soaks BID x 5 days  - Rx keflex  BID x 7 days  - Continue all other medications as Rx   Family/ staff Communication: discussed plan with patient and nursing    Labs/tests ordered: None   Floyde Dingley S. Ancil Linsey  [facility] [address] [place], [place] [address] [phone] Office (Wednesdays and Fridays 8 AM - 5 PM) [phone] Cell (Monday-Friday 8 AM - 5 PM)

## 2014-10-10 MED ORDER — NYSTATIN 100000 UNIT/GM EX POWD: CUTANEOUS | Status: DC

## 2014-11-10 ENCOUNTER — Non-Acute Institutional Stay (SKILLED_NURSING_FACILITY): Payer: Medicare Other | Admitting: Adult Health

## 2014-11-10 DIAGNOSIS — I10 Essential (primary) hypertension: Secondary | ICD-10-CM | POA: Diagnosis not present

## 2014-11-10 DIAGNOSIS — K279 Peptic ulcer, site unspecified, unspecified as acute or chronic, without hemorrhage or perforation: Secondary | ICD-10-CM

## 2014-11-10 DIAGNOSIS — E119 Type 2 diabetes mellitus without complications: Secondary | ICD-10-CM | POA: Diagnosis not present

## 2014-11-10 DIAGNOSIS — M150 Primary generalized (osteo)arthritis: Secondary | ICD-10-CM

## 2014-11-10 DIAGNOSIS — N184 Chronic kidney disease, stage 4 (severe): Secondary | ICD-10-CM

## 2014-11-10 DIAGNOSIS — M159 Polyosteoarthritis, unspecified: Secondary | ICD-10-CM

## 2014-12-11 ENCOUNTER — Encounter: Payer: Self-pay | Admitting: Adult Health

## 2014-12-11 NOTE — Progress Notes (Signed)
Patient ID: Chelsea [name], female   DOB: 06/05/1919, 81 y.o.   MRN: [id number]  [facility]     No Known Allergies     Chief Complaint  Patient presents with  . Annual Exam    HPI:  She is a long term resident of this facility being seen for her annual exam. Overall her status is without change. She has not been hospitalized over the past year. She has not had any major infections this past year. She will not allow for lab work to be done. She is nor voicing any concerns or complaints today. There are no nursing concerns today.    Past Medical History  Diagnosis Date  . Diabetes mellitus without complication   . Hyperlipidemia   . Hypertension   . PUD (peptic ulcer disease)   . Constipation   . Osteoarthritis   . Chronic pain   . Cholelithiasis     Past Surgical History  Procedure Laterality Date  . Abdominal hysterectomy    . Cystoplasty    . Right hip surgery      VITAL SIGNS BP 132/66 mmHg  Pulse 67  Ht 5\' 4"  (1.626 m)  Wt 175 lb (79.379 kg)  BMI 30.02 kg/m2  SpO2 98%   Outpatient Encounter Prescriptions as of 11/10/2014  Medication Sig  . acetaminophen (TYLENOL) 325 MG tablet Take 650 mg by mouth at bedtime.   Marland Kitchen. amLODipine (NORVASC) 10 MG tablet Take 10 mg by mouth daily.  . hydrochlorothiazide (HYDRODIURIL) 25 MG tablet Take 0.5 tablets (12.5 mg total) by mouth daily.  . Menthol-Methyl Salicylate (BENGAY GREASELESS EX) Apply 1 application topically 3 (three) times daily as needed. Apply to neck three times daily as needed  . Multiple Vitamin (MULTIVITAMIN) tablet Take 1 tablet by mouth daily.  Marland Kitchen. nystatin (MYCOSTATIN/NYSTOP) 100000 UNIT/GM POWD Apply under breasts and groin prn redness  . polyethylene glycol (MIRALAX / GLYCOLAX) packet Take 17 g by mouth daily as needed.   . sennosides-docusate sodium (SENOKOT-S) 8.6-50 MG tablet Take 2 tablets by mouth daily.     SIGNIFICANT DIAGNOSTIC EXAMS  LABS REVIEWED: DECLINES LABS  08-30-13: wbc 7.2; hgb 9.3;  hct 28.3; mcv 90.5; plt 225; gucose 101; bun 54; creat 2.31; k+4.7; na++138; liver normal albumin 3.7     ASSESSMENT/ PLAN:  1. Hypertension: she is stable will continue norvasc 10 mg daily; hctz 12.5 mg daily. She will not allow any blood work to be drawn  2. Osteoarthritis: she is stable will continue bengay to her feet nightly and tylenol 650 mg nightly   3. Constipation: will continue miralax daily and senna s 2 tabs daily  4. Stage IV CKD: she will not allow for lab work to be done in order to monitor her status; will not make changes and will monitor her status   5. PUD: she is denying any complaints of GI distress; is presently not on medications. Will monitor       ROS Constitutional: Negative for malaise/fatigue.  Respiratory: Negative for cough and shortness of breath.   Cardiovascular: Negative for chest pain, palpitations and leg swelling.  Gastrointestinal: Negative for heartburn, abdominal pain and constipation.  Musculoskeletal: Negative for myalgias and joint pain.  Skin: Negative.   Psychiatric/Behavioral: The patient is not nervous/anxious.      Physical Exam Constitutional: She appears well-developed and well-nourished. No distress.  Neck: Neck supple. No JVD present. No thyromegaly present.  Cardiovascular: Normal rate, regular rhythm and intact distal pulses.   Respiratory: Effort  normal and breath sounds normal. No respiratory distress. She has no wheezes.  GI: Soft. Bowel sounds are normal. She exhibits no distension. There is no tenderness.  Musculoskeletal: She exhibits no edema.  Is able to move all extremities  Neurological: She is alert.  Skin: Skin is warm and dry. She is not diaphoretic.     ASSESSMENT/ PLAN:  1. Hypertension: she is stable will continue norvasc 10 mg daily; hctz 12.5 mg daily. She will not allow any blood work to be drawn  2. Osteoarthritis: she is stable will continue bengay to her feet nightly and tylenol 650 mg nightly    3. Constipation: will continue miralax daily and senna s 2 tabs daily  4. Stage IV CKD: she will not allow for lab work to be done in order to monitor her status; will not make changes and will monitor her status   5. PUD: she is denying any complaints of GI distress; is presently not on medications. Will monitor       Synthia Innocent NP [facility]  Contact [phone] Monday through Friday 8am- 5pm  After hours call [phone]

## 2014-12-22 ENCOUNTER — Non-Acute Institutional Stay (SKILLED_NURSING_FACILITY): Payer: Medicare Other | Admitting: Adult Health

## 2014-12-22 DIAGNOSIS — N184 Chronic kidney disease, stage 4 (severe): Secondary | ICD-10-CM

## 2014-12-22 DIAGNOSIS — I10 Essential (primary) hypertension: Secondary | ICD-10-CM

## 2014-12-22 DIAGNOSIS — K5900 Constipation, unspecified: Secondary | ICD-10-CM

## 2014-12-22 DIAGNOSIS — M150 Primary generalized (osteo)arthritis: Secondary | ICD-10-CM | POA: Diagnosis not present

## 2014-12-22 DIAGNOSIS — M159 Polyosteoarthritis, unspecified: Secondary | ICD-10-CM

## 2014-12-22 DIAGNOSIS — K279 Peptic ulcer, site unspecified, unspecified as acute or chronic, without hemorrhage or perforation: Secondary | ICD-10-CM | POA: Diagnosis not present

## 2015-01-21 ENCOUNTER — Encounter: Payer: Self-pay | Admitting: Adult Health

## 2015-01-21 NOTE — Progress Notes (Signed)
Patient ID: Chelsea [name], female   DOB: 12/30/1918, 81 y.o.   MRN: [id number]  [facility]     No Known Allergies     Chief Complaint  Patient presents with  . Medical Management of Chronic Issues    HPI:  She is a long term resident of this facility being seen for the management of her chronic illnesses. Overall there is little change in her status. She is not voicing any complaints today. There are no nursing concerns being voiced today.    Past Medical History  Diagnosis Date  . Diabetes mellitus without complication   . Hyperlipidemia   . Hypertension   . PUD (peptic ulcer disease)   . Constipation   . Osteoarthritis   . Chronic pain   . Cholelithiasis     Past Surgical History  Procedure Laterality Date  . Abdominal hysterectomy    . Cystoplasty    . Right hip surgery      VITAL SIGNS BP 122/68 mmHg  Pulse 70  Ht 5\' 4"  (1.626 m)  Wt 172 lb (78.019 kg)  BMI 29.51 kg/m2  SpO2 98%   Outpatient Encounter Prescriptions as of 12/22/2014  Medication Sig  . acetaminophen (TYLENOL) 325 MG tablet Take 650 mg by mouth at bedtime.   Marland Kitchen. amLODipine (NORVASC) 10 MG tablet Take 10 mg by mouth daily.  . hydrochlorothiazide (HYDRODIURIL) 25 MG tablet Take 0.5 tablets (12.5 mg total) by mouth daily.  . Menthol-Methyl Salicylate (BENGAY GREASELESS EX) Apply 1 application topically 3 (three) times daily as needed. Apply to neck three times daily as needed  . Multiple Vitamin (MULTIVITAMIN) tablet Take 1 tablet by mouth daily.  Marland Kitchen. nystatin (MYCOSTATIN/NYSTOP) 100000 UNIT/GM POWD Apply under breasts and groin prn redness  . polyethylene glycol (MIRALAX / GLYCOLAX) packet Take 17 g by mouth daily as needed.   . sennosides-docusate sodium (SENOKOT-S) 8.6-50 MG tablet Take 2 tablets by mouth daily.     SIGNIFICANT DIAGNOSTIC EXAMS   LABS REVIEWED: DECLINES LABS  08-30-13: wbc 7.2; hgb 9.3; hct 28.3; mcv 90.5; plt 225; gucose 101; bun 54; creat 2.31; k+4.7; na++138; liver  normal albumin 3.7       ROS Constitutional: Negative for malaise/fatigue.  Respiratory: Negative for cough and shortness of breath.   Cardiovascular: Negative for chest pain, palpitations and leg swelling.  Gastrointestinal: Negative for heartburn, abdominal pain and constipation.  Musculoskeletal: Negative for myalgias and joint pain.  Skin: Negative.   Psychiatric/Behavioral: The patient is not nervous/anxious.     Physical Exam Constitutional: She appears well-developed and well-nourished. No distress.  Neck: Neck supple. No JVD present. No thyromegaly present.  Cardiovascular: Normal rate, regular rhythm and intact distal pulses.   Respiratory: Effort normal and breath sounds normal. No respiratory distress. She has no wheezes.  GI: Soft. Bowel sounds are normal. She exhibits no distension. There is no tenderness.  Musculoskeletal: She exhibits no edema.  Is able to move all extremities  Neurological: She is alert.  Skin: Skin is warm and dry. She is not diaphoretic.     ASSESSMENT/ PLAN:   1. Hypertension: she is stable will continue norvasc 10 mg daily; hctz 12.5 mg daily. She will not allow any blood work to be drawn  2. Osteoarthritis: she is stable will continue bengay to her feet nightly and tylenol 650 mg nightly   3. Constipation: will continue miralax daily and senna s 2 tabs daily  4. Stage IV CKD: she will not allow for lab work to  be done in order to monitor her status; will not make changes and will monitor her status   5. PUD: she is denying any complaints of GI distress; is presently not on medications. Will monitor   Will attempt to check cbc; cmp     Synthia Innocent NP [facility]  Contact [phone] Monday through Friday 8am- 5pm  After hours call [phone]

## 2015-01-26 ENCOUNTER — Encounter: Payer: Self-pay | Admitting: Adult Health

## 2015-01-26 ENCOUNTER — Non-Acute Institutional Stay (SKILLED_NURSING_FACILITY): Payer: Medicare Other | Admitting: Adult Health

## 2015-01-26 DIAGNOSIS — M150 Primary generalized (osteo)arthritis: Secondary | ICD-10-CM

## 2015-01-26 DIAGNOSIS — K5900 Constipation, unspecified: Secondary | ICD-10-CM | POA: Diagnosis not present

## 2015-01-26 DIAGNOSIS — N184 Chronic kidney disease, stage 4 (severe): Secondary | ICD-10-CM | POA: Diagnosis not present

## 2015-01-26 DIAGNOSIS — I10 Essential (primary) hypertension: Secondary | ICD-10-CM

## 2015-01-26 DIAGNOSIS — K279 Peptic ulcer, site unspecified, unspecified as acute or chronic, without hemorrhage or perforation: Secondary | ICD-10-CM | POA: Diagnosis not present

## 2015-01-26 DIAGNOSIS — M159 Polyosteoarthritis, unspecified: Secondary | ICD-10-CM

## 2015-01-26 NOTE — Progress Notes (Signed)
Patient ID: Chelsea [name], female   DOB: 11/16/1918, 81 y.o.   MRN: [id number]  [facility]     No Known Allergies     Chief Complaint  Patient presents with  . Medical Management of Chronic Issues    HPI:  She is a long term resident of this facility being seen for the management of her chronic illnesses. Overall she remains stable. She is not voicing any complaints or concerns at this time. She was seen by the dentist today. There are no nursing concerns at this time.    Past Medical History  Diagnosis Date  . Diabetes mellitus without complication   . Hyperlipidemia   . Hypertension   . PUD (peptic ulcer disease)   . Constipation   . Osteoarthritis   . Chronic pain   . Cholelithiasis     Past Surgical History  Procedure Laterality Date  . Abdominal hysterectomy    . Cystoplasty    . Right hip surgery      VITAL SIGNS BP 126/68 mmHg  Pulse 98  Ht 5\' 4"  (1.626 m)  Wt 173 lb (78.472 kg)  BMI 29.68 kg/m2  SpO2 98%   Outpatient Encounter Prescriptions as of 01/26/2015  Medication Sig  . acetaminophen (TYLENOL) 325 MG tablet Take 650 mg by mouth at bedtime.   Marland Kitchen. amLODipine (NORVASC) 10 MG tablet Take 10 mg by mouth daily.  . hydrochlorothiazide (HYDRODIURIL) 25 MG tablet Take 0.5 tablets (12.5 mg total) by mouth daily.  . Menthol-Methyl Salicylate (BENGAY GREASELESS EX) Apply 1 application topically 3 (three) times daily as needed. Apply to neck three times daily as needed  . Multiple Vitamin (MULTIVITAMIN) tablet Take 1 tablet by mouth daily.  Marland Kitchen. nystatin (MYCOSTATIN/NYSTOP) 100000 UNIT/GM POWD Apply under breasts and groin prn redness  . polyethylene glycol (MIRALAX / GLYCOLAX) packet Take 17 g by mouth daily as needed.   . sennosides-docusate sodium (SENOKOT-S) 8.6-50 MG tablet Take 2 tablets by mouth daily.     SIGNIFICANT DIAGNOSTIC EXAMS    LABS REVIEWED: DECLINES LABS  08-30-13: wbc 7.2; hgb 9.3; hct 28.3; mcv 90.5; plt 225; gucose 101; bun 54; creat  2.31; k+4.7; na++138; liver normal albumin 3.7  12-03-14: UA: neg    ROS Constitutional: Negative for malaise/fatigue.  Respiratory: Negative for cough and shortness of breath.   Cardiovascular: Negative for chest pain, palpitations and leg swelling.  Gastrointestinal: Negative for heartburn, abdominal pain and constipation.  Musculoskeletal: Negative for myalgias and joint pain.  Skin: Negative.   Psychiatric/Behavioral: The patient is not nervous/anxious.      Physical Exam Constitutional: She appears well-developed and well-nourished. No distress.  Neck: Neck supple. No JVD present. No thyromegaly present.  Cardiovascular: Normal rate, regular rhythm and intact distal pulses.   Respiratory: Effort normal and breath sounds normal. No respiratory distress. She has no wheezes.  GI: Soft. Bowel sounds are normal. She exhibits no distension. There is no tenderness.  Musculoskeletal: She exhibits no edema.  Is able to move all extremities  Neurological: She is alert.  Skin: Skin is warm and dry. She is not diaphoretic.    ASSESSMENT/ PLAN:   1. Hypertension: she is stable will continue norvasc 10 mg daily; hctz 12.5 mg daily. She will not allow any blood work to be drawn  2. Osteoarthritis: she is stable will continue bengay to her feet nightly and tylenol 650 mg nightly   3. Constipation: will continue miralax daily prn and senna s 2 tabs daily  4. Stage  IV CKD: she will not allow for lab work to be done in order to monitor her status; will not make changes and will monitor her status   5. PUD: she is denying any complaints of GI distress; is presently not on medications. Will monitor      Synthia Innocent NP [facility]  Contact [phone] Monday through Friday 8am- 5pm  After hours call [phone]

## 2015-02-08 ENCOUNTER — Encounter: Payer: Self-pay | Admitting: *Deleted

## 2015-03-17 ENCOUNTER — Non-Acute Institutional Stay (SKILLED_NURSING_FACILITY): Payer: Medicare Other | Admitting: Adult Health

## 2015-03-17 DIAGNOSIS — K279 Peptic ulcer, site unspecified, unspecified as acute or chronic, without hemorrhage or perforation: Secondary | ICD-10-CM | POA: Diagnosis not present

## 2015-03-17 DIAGNOSIS — K5900 Constipation, unspecified: Secondary | ICD-10-CM | POA: Diagnosis not present

## 2015-03-17 DIAGNOSIS — N184 Chronic kidney disease, stage 4 (severe): Secondary | ICD-10-CM | POA: Diagnosis not present

## 2015-03-17 DIAGNOSIS — M150 Primary generalized (osteo)arthritis: Secondary | ICD-10-CM | POA: Diagnosis not present

## 2015-03-17 DIAGNOSIS — I10 Essential (primary) hypertension: Secondary | ICD-10-CM

## 2015-03-17 DIAGNOSIS — M159 Polyosteoarthritis, unspecified: Secondary | ICD-10-CM

## 2015-04-14 ENCOUNTER — Non-Acute Institutional Stay (SKILLED_NURSING_FACILITY): Payer: Medicare Other | Admitting: Internal Medicine

## 2015-04-14 DIAGNOSIS — E119 Type 2 diabetes mellitus without complications: Secondary | ICD-10-CM

## 2015-04-14 DIAGNOSIS — I10 Essential (primary) hypertension: Secondary | ICD-10-CM | POA: Diagnosis not present

## 2015-04-14 DIAGNOSIS — M150 Primary generalized (osteo)arthritis: Secondary | ICD-10-CM | POA: Diagnosis not present

## 2015-04-14 DIAGNOSIS — M159 Polyosteoarthritis, unspecified: Secondary | ICD-10-CM

## 2015-04-20 ENCOUNTER — Encounter: Payer: Self-pay | Admitting: Adult Health

## 2015-04-20 NOTE — Progress Notes (Signed)
Patient ID: Chelsea [name], female   DOB: December 11, 1918, 81 y.o.   MRN: [id number]  [facility]     No Known Allergies     Chief Complaint  Patient presents with  . Medical Management of Chronic Issues    HPI:  She is a long term resident of this facility being seen for the management of her chronic illnesses. Overall there is no change in her status. She is not voicing any complaints or concerns today stating that she is feeling good. There are no nursing concerns today.    Past Medical History  Diagnosis Date  . Diabetes mellitus without complication   . Hyperlipidemia   . Hypertension   . PUD (peptic ulcer disease)   . Constipation   . Osteoarthritis   . Chronic pain   . Cholelithiasis     Past Surgical History  Procedure Laterality Date  . Abdominal hysterectomy    . Cystoplasty    . Right hip surgery      VITAL SIGNS BP 133/62 mmHg  Pulse 74  Ht  (1.626 m)  Wt 175 lb (79.379 kg)  BMI 30.02 kg/m2  SpO2 98%   Outpatient Encounter Prescriptions as of 03/17/2015  Medication Sig  . acetaminophen (TYLENOL) 325 MG tablet Take 650 mg by mouth at bedtime. For arthritis pain  . amLODipine (NORVASC) 10 MG tablet Take 10 mg by mouth daily.  . hydrochlorothiazide (MICROZIDE) 12.5 MG capsule Take 12.5 mg by mouth daily.  . Menthol-Methyl Salicylate (BENGAY GREASELESS EX) Apply 1 application topically 3 (three) times daily as needed. Apply to neck three times daily as needed  . Multiple Vitamin (MULTIVITAMIN) tablet Take 1 tablet by mouth daily.  Marland Kitchen nystatin (MYCOSTATIN/NYSTOP) 100000 UNIT/GM POWD Apply under breasts and groin prn redness  . polyethylene glycol (MIRALAX / GLYCOLAX) packet Take 17 g by mouth daily as needed.   . sennosides-docusate sodium (SENOKOT-S) 8.6-50 MG tablet Take 2 tablets by mouth daily. For constipation     SIGNIFICANT DIAGNOSTIC EXAMS   LABS REVIEWED: DECLINES LABS  08-30-13: wbc 7.2; hgb 9.3; hct 28.3; mcv 90.5; plt 225; gucose 101; bun  54; creat 2.31; k+4.7; na++138; liver normal albumin 3.7  12-03-14: UA: neg      ROS Constitutional: Negative for malaise/fatigue.  Respiratory: Negative for cough and shortness of breath.   Cardiovascular: Negative for chest pain, palpitations and leg swelling.  Gastrointestinal: Negative for heartburn, abdominal pain and constipation.  Musculoskeletal: Negative for myalgias and joint pain.  Skin: Negative.   Psychiatric/Behavioral: The patient is not nervous/anxious.      Physical Exam Constitutional: She appears well-developed and well-nourished. No distress.  Neck: Neck supple. No JVD present. No thyromegaly present.  Cardiovascular: Normal rate, regular rhythm and intact distal pulses.   Respiratory: Effort normal and breath sounds normal. No respiratory distress. She has no wheezes.  GI: Soft. Bowel sounds are normal. She exhibits no distension. There is no tenderness.  Musculoskeletal: She exhibits no edema.  Is able to move all extremities  Neurological: She is alert.  Skin: Skin is warm and dry. She is not diaphoretic.    ASSESSMENT/ PLAN:  1. Hypertension: she is stable will continue norvasc 10 mg daily; hctz 12.5 mg daily. She will not allow any blood work to be drawn  2. Osteoarthritis: she is stable will continue bengay to her feet nightly and tylenol 650 mg nightly   3. Constipation: will continue miralax daily prn and senna s 2 tabs daily  4. Stage  IV CKD: she will not allow for lab work to be done in order to monitor her status; will not make changes and will monitor her status   5. PUD: she is denying any complaints of GI distress; is presently not on medications. Will monitor    Synthia Innocenteborah Tashika Goodin NP [facility]  Contact [phone] Monday through Friday 8am- 5pm  After hours call [phone]

## 2015-04-21 NOTE — Progress Notes (Signed)
Patient ID: Chelsea [name], female   DOB: 04-09-1919, 81 y.o.   MRN: [id number]    DATE: 04/14/15  Location:  [name]    Place of Service: SNF (31)   Extended Emergency Contact Information Primary Emergency Contact: Larock,[name] Address: [address]          [place], [place] Home Phone: [phone] Relation: Other  Advanced Directive information  FULL CODE  Chief Complaint  Patient presents with  . Medical Management of Chronic Issues    HPI:  81 yo female long term resident seen today for f/u. She is HOH due to transient deafness and unable to obtain further HPI. Hx obtained form chart. No nursing issues. No falls. Appetite ok. Sleeping well.  DM - No CBGs checked. BS controlled by diet. No numbness or tingling  HTN - BP stable on HCTZ  OA - joint pain chronic. Takes Tylenol  For pain. Today she c/o b/l foot pain.  Constipation - stable on MOM prn   Past Medical History  Diagnosis Date  . Diabetes mellitus without complication (HCC)   . Hyperlipidemia   . Hypertension   . PUD (peptic ulcer disease)   . Constipation   . Osteoarthritis   . Chronic pain   . Cholelithiasis     Past Surgical History  Procedure Laterality Date  . Abdominal hysterectomy    . Cystoplasty    . Right hip surgery      Patient Care Team: Margit Hanks, MD as PCP - General (Internal Medicine) Sharee Holster, NP as Nurse Practitioner (Nurse Practitioner) [name] (Skilled Nursing Facility)  Social History   Social History  . Marital Status: Widowed    Spouse Name: N/A  . Number of Children: N/A  . Years of Education: N/A   Occupational History  . Not on file.   Social History Main Topics  . Smoking status: Never Smoker   . Smokeless tobacco: Not on file  . Alcohol Use: No  . Drug Use: No  . Sexual Activity: Not on file   Other Topics Concern  . Not on file   Social History Narrative     reports that she has never  smoked. She does not have any smokeless tobacco history on file. She reports that she does not drink alcohol or use illicit drugs.  Immunization History  Administered Date(s) Administered  . Influenza Whole 07/20/2013  . Influenza-Unspecified 07/19/2014  . PPD Test 05/30/2011  . Pneumococcal-Unspecified 05/17/2011    No Known Allergies  Medications: Patient's Medications  New Prescriptions   FLUTICASONE (FLONASE) 50 MCG/ACT NASAL SPRAY    Place 2 sprays into both nostrils daily.  Previous Medications   ACETAMINOPHEN (TYLENOL) 325 MG TABLET    Take 650 mg by mouth every 6 (six) hours as needed. For arthritis pain   AMLODIPINE (NORVASC) 10 MG TABLET    Take 10 mg by mouth daily.   HYDROCHLOROTHIAZIDE (MICROZIDE) 12.5 MG CAPSULE    Take 12.5 mg by mouth daily.   MENTHOL-METHYL SALICYLATE (BENGAY GREASELESS EX)    Apply 1 application topically 3 (three) times daily as needed. Apply to neck three times daily as needed   MULTIPLE VITAMIN (MULTIVITAMIN) TABLET    Take 1 tablet by mouth daily.   POLYETHYLENE GLYCOL (MIRALAX / GLYCOLAX) PACKET    Take 17 g by mouth daily as needed.    SENNOSIDES-DOCUSATE SODIUM (SENOKOT-S) 8.6-50 MG TABLET    Take 2 tablets by mouth  daily. For constipation  Modified Medications   No medications on file  Discontinued Medications   NYSTATIN (MYCOSTATIN/NYSTOP) 100000 UNIT/GM POWD    Apply under breasts and groin prn redness    Review of Systems  Unable to perform ROS: Other  pt extremely HOH  Filed Vitals:   04/14/15 2205  BP: 122/58  Pulse: 70  Temp: 97.1 F (36.2 C)  Weight: 175 lb (79.379 kg)  SpO2: 99%   Body mass index is 30.02 kg/(m^2).  Physical Exam  Constitutional: She appears well-developed and well-nourished.  Lying in bed in NAD.  HENT:  Mouth/Throat: Oropharynx is clear and moist. No oropharyngeal exudate.  Eyes: Pupils are equal, round, and reactive to light. No scleral icterus.  Neck: Neck supple. Carotid bruit is not present.  No tracheal deviation present. No thyromegaly present.  Cardiovascular: Normal rate, regular rhythm and intact distal pulses.   Occasional extrasystoles are present. Exam reveals no gallop and no friction rub.   Murmur (1/6 SEM) heard. No LE edema b/l. No calf TTP  Pulmonary/Chest: Effort normal and breath sounds normal. No stridor. No respiratory distress. She has no wheezes. She has no rales.  Abdominal: Soft. Bowel sounds are normal. She exhibits no distension and no mass. There is no hepatomegaly. There is no tenderness. There is no rebound and no guarding.  Musculoskeletal: She exhibits edema.  Trace R>L ankle swelling with reduced ROM. Hammer toes and bunions noted but no calluses or ulcerations  Lymphadenopathy:    She has no cervical adenopathy.  Neurological: She is alert.  Skin: Skin is warm and dry. No rash noted.  Psychiatric: She has a normal mood and affect. Her behavior is normal.     Labs reviewed: No visits with results within 3 Month(s) from this visit. Latest known visit with results is:  Admission on 05/17/2011, Discharged on 05/30/2011  No results displayed because visit has over 200 results.      No results found.   Assessment/Plan   ICD-9-CM ICD-10-CM   1. Controlled type 2 diabetes mellitus without complication, without long-term current use of insulin (HCC) 250.00 E11.9   2. Primary osteoarthritis involving multiple joints 715.09 M15.0   3. Essential hypertension, benign 401.1 I10    Pt continues to refuse lab draws  Pt is medically stable on current tx plan. Continue current medications as ordered. PT/OT/ST as indicated. Will follow   Jaysen Wey S. Ancil Linseyarter, D. O., F. A. C. O. I.  [facility] [address] [place], [place] [address] [phone] Cell (Monday-Friday 8 AM - 5 PM) [phone] After 5 PM and follow prompts

## 2015-05-17 ENCOUNTER — Non-Acute Institutional Stay (SKILLED_NURSING_FACILITY): Payer: Medicare Other | Admitting: Adult Health

## 2015-05-17 ENCOUNTER — Encounter: Payer: Self-pay | Admitting: Adult Health

## 2015-05-17 DIAGNOSIS — N184 Chronic kidney disease, stage 4 (severe): Secondary | ICD-10-CM | POA: Diagnosis not present

## 2015-05-17 DIAGNOSIS — I10 Essential (primary) hypertension: Secondary | ICD-10-CM

## 2015-05-17 DIAGNOSIS — M150 Primary generalized (osteo)arthritis: Secondary | ICD-10-CM | POA: Diagnosis not present

## 2015-05-17 DIAGNOSIS — K5900 Constipation, unspecified: Secondary | ICD-10-CM | POA: Diagnosis not present

## 2015-05-17 DIAGNOSIS — K279 Peptic ulcer, site unspecified, unspecified as acute or chronic, without hemorrhage or perforation: Secondary | ICD-10-CM | POA: Diagnosis not present

## 2015-05-17 DIAGNOSIS — M159 Polyosteoarthritis, unspecified: Secondary | ICD-10-CM

## 2015-05-17 NOTE — Progress Notes (Signed)
Patient ID: Chelsea [name], female   DOB: 01/03/1919, 81 y.o.   MRN: [id number]    Facility: [name]      No Known Allergies  Chief Complaint  Patient presents with  . Medical Management of Chronic Issues    HPI:  She is a long term resident of this facility being seen for the management of her chronic illnesses. There is no significant in her overall status. She is not voicing any complaints today stating that she is feeling good. There are no nursing concerns today.    Past Medical History  Diagnosis Date  . Diabetes mellitus without complication   . Hyperlipidemia   . Hypertension   . PUD (peptic ulcer disease)   . Constipation   . Osteoarthritis   . Chronic pain   . Cholelithiasis     Past Surgical History  Procedure Laterality Date  . Abdominal hysterectomy    . Cystoplasty    . Right hip surgery      VITAL SIGNS BP 111/54 mmHg  Pulse 78  Ht 5\' 4"  (1.626 m)  Wt 175 lb (79.379 kg)  BMI 30.02 kg/m2  SpO2 98%  Patient's Medications  New Prescriptions   No medications on file  Previous Medications   ACETAMINOPHEN (TYLENOL) 325 MG TABLET    Take 650 mg by mouth at bedtime. For arthritis pain   AMLODIPINE (NORVASC) 10 MG TABLET    Take 10 mg by mouth daily.   HYDROCHLOROTHIAZIDE (MICROZIDE) 12.5 MG CAPSULE    Take 12.5 mg by mouth daily.   MENTHOL-METHYL SALICYLATE (BENGAY GREASELESS EX)    Apply 1 application topically 3 (three) times daily as needed. Apply to neck three times daily as needed   MULTIPLE VITAMIN (MULTIVITAMIN) TABLET    Take 1 tablet by mouth daily.   NYSTATIN (MYCOSTATIN/NYSTOP) 100000 UNIT/GM POWD    Apply under breasts and groin prn redness   POLYETHYLENE GLYCOL (MIRALAX / GLYCOLAX) PACKET    Take 17 g by mouth daily as needed.    SENNOSIDES-DOCUSATE SODIUM (SENOKOT-S) 8.6-50 MG TABLET    Take 2 tablets by mouth daily. For constipation  Modified Medications   No medications on file  Discontinued Medications   No  medications on file     SIGNIFICANT DIAGNOSTIC EXAMS   LABS REVIEWED: DECLINES LABS  08-30-13: wbc 7.2; hgb 9.3; hct 28.3; mcv 90.5; plt 225; gucose 101; bun 54; creat 2.31; k+4.7; na++138; liver normal albumin 3.7  12-03-14: UA: neg     Review of Systems  Constitutional: Negative for appetite change and fatigue.  HENT: Negative for congestion.   Respiratory: Negative for cough, chest tightness and shortness of breath.   Cardiovascular: Negative for chest pain, palpitations and leg swelling.  Gastrointestinal: Negative for nausea, abdominal pain, diarrhea and constipation.  Musculoskeletal: Negative for myalgias and arthralgias.  Skin: Negative for pallor.  Psychiatric/Behavioral: The patient is not nervous/anxious.       Physical Exam  Constitutional: No distress.  Overweight   Eyes: Conjunctivae are normal.  Neck: Neck supple. No JVD present. No thyromegaly present.  Cardiovascular: Normal rate, regular rhythm and intact distal pulses.   Respiratory: Effort normal and breath sounds normal. No respiratory distress. She has no wheezes.  GI: Soft. Bowel sounds are normal. She exhibits no distension. There is no tenderness.  Musculoskeletal: She exhibits no edema.  Able to move all extremities   Lymphadenopathy:    She has no cervical adenopathy.  Neurological: She is alert.  Skin:  Skin is warm and dry. She is not diaphoretic.  Psychiatric: She has a normal mood and affect.       ASSESSMENT/ PLAN:  1. Hypertension: she is stable will continue norvasc 10 mg daily; hctz 12.5 mg daily. She will not allow any blood work to be drawn  2. Osteoarthritis: she is stable will continue bengay to her feet nightly and tylenol 650 mg nightly   3. Constipation: will continue miralax daily prn and senna s 2 tabs daily  4. Stage IV CKD: she will not allow for lab work to be done in order to monitor her status; will not make changes and will monitor her status   5. PUD: she is  denying any complaints of GI distress; is presently not on medications. Will monitor        Synthia Innocent NP [facility]  Contact [phone] Monday through Friday 8am- 5pm  After hours call [phone]

## 2015-06-16 ENCOUNTER — Non-Acute Institutional Stay (SKILLED_NURSING_FACILITY): Payer: Medicare Other | Admitting: Adult Health

## 2015-06-16 DIAGNOSIS — N184 Chronic kidney disease, stage 4 (severe): Secondary | ICD-10-CM | POA: Diagnosis not present

## 2015-06-16 DIAGNOSIS — K279 Peptic ulcer, site unspecified, unspecified as acute or chronic, without hemorrhage or perforation: Secondary | ICD-10-CM

## 2015-06-16 DIAGNOSIS — I10 Essential (primary) hypertension: Secondary | ICD-10-CM

## 2015-06-16 DIAGNOSIS — K5900 Constipation, unspecified: Secondary | ICD-10-CM

## 2015-07-04 ENCOUNTER — Encounter: Payer: Self-pay | Admitting: Adult Health

## 2015-07-04 NOTE — Progress Notes (Signed)
Patient ID: Chelsea KLEINE, female   DOB: [birth year], 81 y.o.   MRN: [id number]    Facility: [name]      No Known Allergies  Chief Complaint  Patient presents with  . Medical Management of Chronic Issues    HPI:  She is a long term resident of this facility being seen for the management of her chronic illnesses. Overall her status is without change. She is not voicing any concerns or complaints today; stating that she is feeling good. There are no nursing concerns today.     Past Medical History  Diagnosis Date  . Diabetes mellitus without complication   . Hyperlipidemia   . Hypertension   . PUD (peptic ulcer disease)   . Constipation   . Osteoarthritis   . Chronic pain   . Cholelithiasis     Past Surgical History  Procedure Laterality Date  . Abdominal hysterectomy    . Cystoplasty    . Right hip surgery      VITAL SIGNS BP 133/59 mmHg  Pulse 72  Ht  (1.626 m)  Wt 175 lb (79.379 kg)  BMI 30.02 kg/m2  Patient's Medications  New Prescriptions   No medications on file  Previous Medications   ACETAMINOPHEN (TYLENOL) 325 MG TABLET    Take 650 mg by mouth at bedtime. For arthritis pain   AMLODIPINE (NORVASC) 10 MG TABLET    Take 10 mg by mouth daily.   HYDROCHLOROTHIAZIDE (MICROZIDE) 12.5 MG CAPSULE    Take 12.5 mg by mouth daily.   MENTHOL-METHYL SALICYLATE (BENGAY GREASELESS EX)    Apply 1 application topically 3 (three) times daily as needed. Apply to neck three times daily as needed   MULTIPLE VITAMIN (MULTIVITAMIN) TABLET    Take 1 tablet by mouth daily.   NYSTATIN (MYCOSTATIN/NYSTOP) 100000 UNIT/GM POWD    Apply under breasts and groin prn redness   POLYETHYLENE GLYCOL (MIRALAX / GLYCOLAX) PACKET    Take 17 g by mouth daily as needed.    SENNOSIDES-DOCUSATE SODIUM (SENOKOT-S) 8.6-50 MG TABLET    Take 2 tablets by mouth daily. For constipation  Modified Medications   No medications on file  Discontinued Medications   No medications on  file     SIGNIFICANT DIAGNOSTIC EXAMS  LABS REVIEWED: DECLINES LABS  08-30-13: wbc 7.2; hgb 9.3; hct 28.3; mcv 90.5; plt 225; gucose 101; bun 54; creat 2.31; k+4.7; na++138; liver normal albumin 3.7  12-03-14: UA: neg      Review of Systems Constitutional: Negative for appetite change and fatigue.  HENT: Negative for congestion.   Respiratory: Negative for cough, chest tightness and shortness of breath.   Cardiovascular: Negative for chest pain, palpitations and leg swelling.  Gastrointestinal: Negative for nausea, abdominal pain, diarrhea and constipation.  Musculoskeletal: Negative for myalgias and arthralgias.  Skin: Negative for pallor.  Psychiatric/Behavioral: The patient is not nervous/anxious.     Physical Exam Constitutional: No distress.  Overweight   Eyes: Conjunctivae are normal.  Neck: Neck supple. No JVD present. No thyromegaly present.  Cardiovascular: Normal rate, regular rhythm and intact distal pulses.   Respiratory: Effort normal and breath sounds normal. No respiratory distress. She has no wheezes.  GI: Soft. Bowel sounds are normal. She exhibits no distension. There is no tenderness.  Musculoskeletal: She exhibits no edema.  Able to move all extremities   Lymphadenopathy:    She has no cervical adenopathy.  Neurological: She is alert.  Skin: Skin is warm and dry. She  is not diaphoretic.  Psychiatric: She has a normal mood and affect.   ASSESSMENT/ PLAN:  1. Hypertension: she is stable will continue norvasc 10 mg daily; hctz 12.5 mg daily. She will not allow any blood work to be drawn  2. Osteoarthritis: she is stable will continue bengay to her feet nightly and tylenol 650 mg nightly   3. Constipation: will continue miralax daily prn and senna s 2 tabs daily  4. Stage IV CKD: she will not allow for lab work to be done in order to monitor her status; will not make changes and will monitor her status   5. PUD: she is denying any complaints of GI  distress; is presently not on medications. Will monitor      Synthia Innocent NP [facility]  Contact [phone] Monday through Friday 8am- 5pm  After hours call [phone]

## 2015-08-04 ENCOUNTER — Non-Acute Institutional Stay (SKILLED_NURSING_FACILITY): Payer: Medicare Other | Admitting: Internal Medicine

## 2015-08-04 ENCOUNTER — Encounter: Payer: Self-pay | Admitting: Internal Medicine

## 2015-08-04 DIAGNOSIS — E785 Hyperlipidemia, unspecified: Secondary | ICD-10-CM | POA: Diagnosis not present

## 2015-08-04 DIAGNOSIS — K279 Peptic ulcer, site unspecified, unspecified as acute or chronic, without hemorrhage or perforation: Secondary | ICD-10-CM

## 2015-08-04 DIAGNOSIS — M150 Primary generalized (osteo)arthritis: Secondary | ICD-10-CM

## 2015-08-04 DIAGNOSIS — B372 Candidiasis of skin and nail: Secondary | ICD-10-CM | POA: Diagnosis not present

## 2015-08-04 DIAGNOSIS — N184 Chronic kidney disease, stage 4 (severe): Secondary | ICD-10-CM

## 2015-08-04 DIAGNOSIS — K5901 Slow transit constipation: Secondary | ICD-10-CM

## 2015-08-04 DIAGNOSIS — I10 Essential (primary) hypertension: Secondary | ICD-10-CM

## 2015-08-04 DIAGNOSIS — M159 Polyosteoarthritis, unspecified: Secondary | ICD-10-CM

## 2015-08-04 NOTE — Assessment & Plan Note (Signed)
Chronic and stable on norvasc 10 mg and HCTZ 12.5 mg ; cont meds

## 2015-08-04 NOTE — Progress Notes (Signed)
MRN: [id number] Name: Cecilie LowersBertha E Doberstein  Sex: female Age: 81 y.o. DOB: 07/19/1919  [facility] Facility[facility]228B Level Of Care: SNF Provider: Merrilee SeashoreALEXANDER, Cuinn Westerhold D Emergency Contacts: Extended Emergency Contact Information Primary Emergency Contact: Spargur,[name] Address: [address]          [place], [place] Home Phone: [phone] Relation: Other  Code Status: FULL  Allergies: Review of patient's allergies indicates no known allergies.  Chief Complaint  Patient presents with  . Medical Management of Chronic Issues    HPI: Patient is 81 y.o. female with HTN, PUD, constipation OA, HLD and CKD4 being seen for all routine issues.  Past Medical History  Diagnosis Date  . Diabetes mellitus without complication (HCC)   . Hyperlipidemia   . Hypertension   . PUD (peptic ulcer disease)   . Constipation   . Osteoarthritis   . Chronic pain   . Cholelithiasis     Past Surgical History  Procedure Laterality Date  . Abdominal hysterectomy    . Cystoplasty    . Right hip surgery        Medication List       This list is accurate as of: 08/04/15  1:50 PM.  Always use your most recent med list.               acetaminophen 325 MG tablet  Commonly known as:  TYLENOL  Take 650 mg by mouth at bedtime. For arthritis pain     amLODipine 10 MG tablet  Commonly known as:  NORVASC  Take 10 mg by mouth daily.     BENGAY GREASELESS EX  Apply 1 application topically 3 (three) times daily as needed. Apply to neck three times daily as needed     hydrochlorothiazide 12.5 MG capsule  Commonly known as:  MICROZIDE  Take 12.5 mg by mouth daily.     multivitamin tablet  Take 1 tablet by mouth daily.     nystatin 100000 UNIT/GM Powd  Apply under breasts and groin prn redness     polyethylene glycol packet  Commonly known as:  MIRALAX / GLYCOLAX  Take 17 g by mouth daily as needed.     sennosides-docusate sodium 8.6-50 MG tablet  Commonly known as:  SENOKOT-S  Take 2 tablets  by mouth daily. For constipation        No orders of the defined types were placed in this encounter.    Immunization History  Administered Date(s) Administered  . Influenza Whole 07/20/2013  . Influenza-Unspecified 07/19/2014  . PPD Test 05/30/2011  . Pneumococcal-Unspecified 05/17/2011    Social History  Substance Use Topics  . Smoking status: Never Smoker   . Smokeless tobacco: Not on file  . Alcohol Use: No    Review of Systems  DATA OBTAINED: from patient, nurse; no c/o or concerns GENERAL:  no fevers, fatigue, appetite changes SKIN: No itching, rash HEENT: HOH RESPIRATORY: No cough, wheezing, SOB CARDIAC: No chest pain, palpitations, lower extremity edema  GI: No abdominal pain, No N/V/D or constipation, No heartburn or reflux  GU: No dysuria, frequency or urgency, or incontinence  MUSCULOSKELETAL: No unrelieved bone/joint pain NEUROLOGIC: No headache, dizziness  PSYCHIATRIC: No overt anxiety or sadness  Filed Vitals:   08/04/15 1334  BP: 118/60  Pulse: 86  Temp: 98.4 F (36.9 C)  Resp: 20    Physical Exam  GENERAL APPEARANCE: Alert, conversant, No acute distress  SKIN: No diaphoresis rash HEENT: Unremarkable x HOH RESPIRATORY: Breathing is even, unlabored. Lung sounds  are clear   CARDIOVASCULAR: Heart RRR no murmurs, rubs or gallops. No peripheral edema  GASTROINTESTINAL: Abdomen is soft, non-tender, not distended w/ normal bowel sounds.  GENITOURINARY: Bladder non tender, not distended  MUSCULOSKELETAL: No abnormal joints or musculature NEUROLOGIC: Cranial nerves 2-12 grossly intact. Moves all extremities PSYCHIATRIC: Mood and affect appropriate to situation, no behavioral issues  Patient Active Problem List   Diagnosis Date Noted  . Candidal intertrigo 08/04/2015  . Chronic renal disease, stage 4, severely decreased glomerular filtration rate between 15-29 mL/min/1.73 square meter (HCC) 11/19/2013  . Anemia 11/19/2013  . Diabetes mellitus type  2, controlled, without complications (HCC) 01/13/2013  . Hyperlipidemia 01/13/2013  . Essential hypertension, benign 01/13/2013  . PUD (peptic ulcer disease) 01/13/2013  . Constipation 01/13/2013  . Osteoarthritis 01/13/2013    CBC    Component Value Date/Time   WBC 7.8 05/24/2011 0500   RBC 3.94 05/24/2011 0500   HGB 11.6* 05/24/2011 0500   HCT 35.1* 05/24/2011 0500   PLT 185 05/24/2011 0500   MCV 89.1 05/24/2011 0500   LYMPHSABS 1.1 05/22/2011 0500   MONOABS 0.7 05/22/2011 0500   EOSABS 0.3 05/22/2011 0500   BASOSABS 0.0 05/22/2011 0500    CMP     Component Value Date/Time   NA 139 05/24/2011 0500   K 3.6 05/24/2011 0500   CL 100 05/24/2011 0500   CO2 30 05/24/2011 0500   GLUCOSE 92 05/24/2011 0500   BUN 25* 05/24/2011 0500   CREATININE 1.13* 05/24/2011 0500   CALCIUM 9.5 05/24/2011 0500   PROT 6.9 05/18/2011 0540   ALBUMIN 3.7 05/18/2011 0540   AST 15 05/18/2011 0540   ALT 11 05/18/2011 0540   ALKPHOS 101 05/18/2011 0540   BILITOT 0.4 05/18/2011 0540   GFRNONAA 45* 05/24/2011 0500   GFRAA 55* 05/24/2011 0500    Assessment and Plan  Hyperlipidemia Pt had ben taking lipitor but due to age this was d/c about 1 1/2 years ago  Chronic renal disease, stage 4, severely decreased glomerular filtration rate between 15-29 mL/min/1.73 square meter Pt does not allow labs, last known Cr was 2.31; there have been no issues clinically; cont monitor clinically  Osteoarthritis Chronic and stable; cont tylenol and bengay to feet;  Cont current meds  Constipation Chronic and stable on senakot -s daily and prn miralax; cont current meds  PUD (peptic ulcer disease) Chronic and stable on no meds; cont to monitor  Essential hypertension, benign Chronic and stable on norvasc 10 mg and HCTZ 12.5 mg ; cont meds  Candidal intertrigo Breast and groin; cont prn for redness to area    Margit Hanks, MD

## 2015-08-04 NOTE — Assessment & Plan Note (Addendum)
Chronic and stable; cont tylenol and bengay to feet;  Cont current meds

## 2015-08-04 NOTE — Assessment & Plan Note (Addendum)
Pt does not allow labs, last known Cr was 2.31; there have been no issues clinically; cont monitor clinically

## 2015-08-04 NOTE — Assessment & Plan Note (Signed)
Pt had ben taking lipitor but due to age this was d/c about 1 1/2 years ago

## 2015-08-04 NOTE — Assessment & Plan Note (Addendum)
Chronic and stable on senakot -s daily and prn miralax; cont current meds

## 2015-08-04 NOTE — Assessment & Plan Note (Signed)
Breast and groin; cont prn for redness to area

## 2015-08-04 NOTE — Assessment & Plan Note (Addendum)
Chronic and stable on no meds; cont to monitor

## 2015-09-07 ENCOUNTER — Non-Acute Institutional Stay (SKILLED_NURSING_FACILITY): Payer: Medicare Other | Admitting: Adult Health

## 2015-09-07 ENCOUNTER — Encounter: Payer: Self-pay | Admitting: Adult Health

## 2015-09-07 DIAGNOSIS — K5901 Slow transit constipation: Secondary | ICD-10-CM | POA: Diagnosis not present

## 2015-09-07 DIAGNOSIS — J302 Other seasonal allergic rhinitis: Secondary | ICD-10-CM | POA: Diagnosis not present

## 2015-09-07 DIAGNOSIS — K279 Peptic ulcer, site unspecified, unspecified as acute or chronic, without hemorrhage or perforation: Secondary | ICD-10-CM | POA: Diagnosis not present

## 2015-09-07 DIAGNOSIS — I10 Essential (primary) hypertension: Secondary | ICD-10-CM

## 2015-09-07 DIAGNOSIS — N184 Chronic kidney disease, stage 4 (severe): Secondary | ICD-10-CM

## 2015-09-07 MED ORDER — FLUTICASONE PROPIONATE 50 MCG/ACT NA SUSP: 2.0000 | Freq: Every day | NASAL | Status: DC

## 2015-09-07 NOTE — Progress Notes (Signed)
Patient ID: Chelsea [name], female   DOB: 06/09/1919, 81 y.o.   MRN: [id number]   Facility:  [name]      No Known Allergies  Chief Complaint  Patient presents with  . Medical Management of Chronic Issues    HPI:  She is a long term resident of this facility being seen for the management of her chronic illnesses. She is complaining of sinus congestion and headache. She denies any cough or drainage. We discussed her treatment options; she is willing to use a nasal spray at night. There are no nursing concerns at this time.     Past Medical History  Diagnosis Date  . Diabetes mellitus without complication (HCC)   . Hyperlipidemia   . Hypertension   . PUD (peptic ulcer disease)   . Constipation   . Osteoarthritis   . Chronic pain   . Cholelithiasis     Past Surgical History  Procedure Laterality Date  . Abdominal hysterectomy    . Cystoplasty    . Right hip surgery      VITAL SIGNS BP 132/60 mmHg  Pulse 76  Ht 5\' 4"  (1.626 m)  Wt 173 lb (78.472 kg)  BMI 29.68 kg/m2  SpO2 97%  Patient's Medications  New Prescriptions   No medications on file  Previous Medications   ACETAMINOPHEN (TYLENOL) 325 MG TABLET    Take 650 mg by mouth every 6 (six) hours as needed. For arthritis pain   AMLODIPINE (NORVASC) 10 MG TABLET    Take 10 mg by mouth daily.   HYDROCHLOROTHIAZIDE (MICROZIDE) 12.5 MG CAPSULE    Take 12.5 mg by mouth daily.   MENTHOL-METHYL SALICYLATE (BENGAY GREASELESS EX)    Apply 1 application topically 3 (three) times daily as needed. Apply to neck three times daily as needed   MULTIPLE VITAMIN (MULTIVITAMIN) TABLET    Take 1 tablet by mouth daily.   POLYETHYLENE GLYCOL (MIRALAX / GLYCOLAX) PACKET    Take 17 g by mouth daily as needed.    SENNOSIDES-DOCUSATE SODIUM (SENOKOT-S) 8.6-50 MG TABLET    Take 2 tablets by mouth daily. For constipation  Modified Medications   No medications on file  Discontinued Medications   NYSTATIN (MYCOSTATIN/NYSTOP) 100000 UNIT/GM  POWD    Apply under breasts and groin prn redness     SIGNIFICANT DIAGNOSTIC EXAMS   LABS REVIEWED: DECLINES LABS  08-30-13: wbc 7.2; hgb 9.3; hct 28.3; mcv 90.5; plt 225; gucose 101; bun 54; creat 2.31; k+4.7; na++138; liver normal albumin 3.7  12-03-14: UA: neg      Review of Systems Constitutional: Negative for appetite change and fatigue.  HENT: has sinus congestion and headache    Respiratory: Negative for cough, chest tightness and shortness of breath.   Cardiovascular: Negative for chest pain, palpitations and leg swelling.  Gastrointestinal: Negative for nausea, abdominal pain, diarrhea and constipation.  Musculoskeletal: Negative for myalgias and arthralgias.  Skin: Negative for pallor.  Psychiatric/Behavioral: The patient is not nervous/anxious.     Physical Exam Constitutional: No distress.  Overweight   Eyes: Conjunctivae are normal.  Neck: Neck supple. No JVD present. No thyromegaly present.  Cardiovascular: Normal rate, regular rhythm and intact distal pulses.   Respiratory: Effort normal and breath sounds normal. No respiratory distress. She has no wheezes.  GI: Soft. Bowel sounds are normal. She exhibits no distension. There is no tenderness.  Musculoskeletal: She exhibits no edema.  Able to move all extremities   Lymphadenopathy:    She has no cervical  adenopathy.  Neurological: She is alert.  Skin: Skin is warm and dry. She is not diaphoretic.  Psychiatric: She has a normal mood and affect.   ASSESSMENT/ PLAN:  1. Hypertension: she is stable will continue norvasc 10 mg daily; hctz 12.5 mg daily. She will not allow any blood work to be drawn  2. Osteoarthritis: she is stable will continue bengay to her feet twice daily and tylenol 650 mg every 6 hours as needed   3. Constipation: will continue miralax daily prn and senna s 2 tabs daily  4. Stage IV CKD: she will not allow for lab work to be done in order to monitor her status; will not make changes  and will monitor her status   5. PUD: she is denying any complaints of GI distress; is presently not on medications. Will monitor   6. allergic rhinitis: will begin flonase nightly and will monitor       Synthia Innocent NP [facility]  Contact [phone] Monday through Friday 8am- 5pm  After hours call [phone]

## 2015-10-04 ENCOUNTER — Non-Acute Institutional Stay (SKILLED_NURSING_FACILITY): Payer: Medicare Other | Admitting: Internal Medicine

## 2015-10-04 DIAGNOSIS — N184 Chronic kidney disease, stage 4 (severe): Secondary | ICD-10-CM

## 2015-10-04 DIAGNOSIS — M150 Primary generalized (osteo)arthritis: Secondary | ICD-10-CM | POA: Diagnosis not present

## 2015-10-04 DIAGNOSIS — I10 Essential (primary) hypertension: Secondary | ICD-10-CM | POA: Diagnosis not present

## 2015-10-04 DIAGNOSIS — M159 Polyosteoarthritis, unspecified: Secondary | ICD-10-CM

## 2015-10-04 NOTE — Progress Notes (Signed)
MRN: [id number] Name: Chelsea [name]  Sex: female Age: 81 y.o. DOB: 04/19/1919  [facility] Level Of Care: SNF Provider: Merrilee SeashoreALEXANDER, Dao Memmott D Emergency Contacts: Extended Emergency Contact Information Primary Emergency Contact: Brucker,[name] Address: [address]          [place], [place] Home Phone: [phone] Relation: Other  Code Status:   Allergies: Review of patient's allergies indicates no known allergies.  Chief Complaint  Patient presents with  . Medical Management of Chronic Issues    HPI: Patient is 81 y.o. female with DM2, HLD, HTN OA who is being seen for routine issues of CKD4, OA and HTN.   Past Medical History  Diagnosis Date  . Diabetes mellitus without complication (HCC)   . Hyperlipidemia   . Hypertension   . PUD (peptic ulcer disease)   . Constipation   . Osteoarthritis   . Chronic pain   . Cholelithiasis     Past Surgical History  Procedure Laterality Date  . Abdominal hysterectomy    . Cystoplasty    . Right hip surgery        Medication List       This list is accurate as of: 10/04/15 11:59 PM.  Always use your most recent med list.               acetaminophen 325 MG tablet  Commonly known as:  TYLENOL  Take 650 mg by mouth every 6 (six) hours as needed. For arthritis pain     amLODipine 10 MG tablet  Commonly known as:  NORVASC  Take 10 mg by mouth daily.     BENGAY GREASELESS EX  Apply 1 application topically 3 (three) times daily as needed. Apply to neck three times daily as needed     fluticasone 50 MCG/ACT nasal spray  Commonly known as:  FLONASE  Place 2 sprays into both nostrils daily.     hydrochlorothiazide 12.5 MG capsule  Commonly known as:  MICROZIDE  Take 12.5 mg by mouth daily.     multivitamin tablet  Take 1 tablet by mouth daily.     polyethylene glycol packet  Commonly known as:  MIRALAX / GLYCOLAX  Take 17 g by mouth daily as needed.     sennosides-docusate sodium 8.6-50 MG  tablet  Commonly known as:  SENOKOT-S  Take 2 tablets by mouth daily. For constipation        No orders of the defined types were placed in this encounter.    Immunization History  Administered Date(s) Administered  . Influenza Whole 07/20/2013  . Influenza-Unspecified 07/19/2014  . PPD Test 05/30/2011  . Pneumococcal-Unspecified 05/17/2011    Social History  Substance Use Topics  . Smoking status: Never Smoker   . Smokeless tobacco: Not on file  . Alcohol Use: No    Review of Systems UTO reliably pt without c/o ;nursing without concerns   Filed Vitals:   10/09/15 1305  BP: 110/62  Pulse: 70  Temp: 98.2 F (36.8 C)  Resp: 20    Physical Exam  GENERAL APPEARANCE: Alert, conversant, obese WF, No acute distress  SKIN: No diaphoresis rash HEENT: Unremarkable RESPIRATORY: Breathing is even, unlabored. Lung sounds are clear   CARDIOVASCULAR: Heart RRR no murmurs, rubs or gallops. No peripheral edema  GASTROINTESTINAL: Abdomen is soft, non-tender, not distended w/ normal bowel sounds.  GENITOURINARY: Bladder non tender, not distended  MUSCULOSKELETAL: No abnormal joints or musculature NEUROLOGIC: Cranial nerves 2-12 grossly intact. Moves all extremities PSYCHIATRIC: Mood  and affect appropriate to situation with dementia, no behavioral issues  Patient Active Problem List   Diagnosis Date Noted  . Seasonal allergies 09/07/2015  . Candidal intertrigo 08/04/2015  . Chronic renal disease, stage 4, severely decreased glomerular filtration rate between 15-29 mL/min/1.73 square meter (HCC) 11/19/2013  . Anemia 11/19/2013  . Diabetes mellitus type 2, controlled, without complications (HCC) 01/13/2013  . Hyperlipidemia 01/13/2013  . Essential hypertension, benign 01/13/2013  . PUD (peptic ulcer disease) 01/13/2013  . Constipation 01/13/2013  . Osteoarthritis 01/13/2013    CBC    Component Value Date/Time   WBC 7.8 05/24/2011 0500   RBC 3.94 05/24/2011 0500   HGB  11.6* 05/24/2011 0500   HCT 35.1* 05/24/2011 0500   PLT 185 05/24/2011 0500   MCV 89.1 05/24/2011 0500   LYMPHSABS 1.1 05/22/2011 0500   MONOABS 0.7 05/22/2011 0500   EOSABS 0.3 05/22/2011 0500   BASOSABS 0.0 05/22/2011 0500    CMP     Component Value Date/Time   NA 139 05/24/2011 0500   K 3.6 05/24/2011 0500   CL 100 05/24/2011 0500   CO2 30 05/24/2011 0500   GLUCOSE 92 05/24/2011 0500   BUN 25* 05/24/2011 0500   CREATININE 1.13* 05/24/2011 0500   CALCIUM 9.5 05/24/2011 0500   PROT 6.9 05/18/2011 0540   ALBUMIN 3.7 05/18/2011 0540   AST 15 05/18/2011 0540   ALT 11 05/18/2011 0540   ALKPHOS 101 05/18/2011 0540   BILITOT 0.4 05/18/2011 0540   GFRNONAA 45* 05/24/2011 0500   GFRAA 55* 05/24/2011 0500    Assessment and Plan  Chronic renal disease, stage 4, severely decreased glomerular filtration rate between 15-29 mL/min/1.73 square meter She will not allow for lab work to be done in order to monitor her status; will not make changes and will monitor her status   Osteoarthritis She is stable will continue bengay to her feet twice daily and tylenol 650 mg every 6 hours as needed   Essential hypertension, benign Chronic,  stable will continue norvasc 10 mg daily; hctz 12.5 mg daily. She will not allow any blood work to be drawn     Margit Hanks, MD

## 2015-10-09 ENCOUNTER — Encounter: Payer: Self-pay | Admitting: Internal Medicine

## 2015-10-09 NOTE — Assessment & Plan Note (Signed)
Chronic,  stable will continue norvasc 10 mg daily; hctz 12.5 mg daily. She will not allow any blood work to be drawn

## 2015-10-09 NOTE — Assessment & Plan Note (Signed)
She is stable will continue bengay to her feet twice daily and tylenol 650 mg every 6 hours as needed

## 2015-10-09 NOTE — Assessment & Plan Note (Signed)
She will not allow for lab work to be done in order to monitor her status; will not make changes and will monitor her status

## 2015-10-15 ENCOUNTER — Encounter: Payer: Self-pay | Admitting: Internal Medicine

## 2015-11-08 ENCOUNTER — Non-Acute Institutional Stay (SKILLED_NURSING_FACILITY): Payer: Medicare Other | Admitting: Adult Health

## 2015-11-08 DIAGNOSIS — K279 Peptic ulcer, site unspecified, unspecified as acute or chronic, without hemorrhage or perforation: Secondary | ICD-10-CM | POA: Diagnosis not present

## 2015-11-08 DIAGNOSIS — N184 Chronic kidney disease, stage 4 (severe): Secondary | ICD-10-CM

## 2015-11-08 DIAGNOSIS — M159 Polyosteoarthritis, unspecified: Secondary | ICD-10-CM

## 2015-11-08 DIAGNOSIS — M150 Primary generalized (osteo)arthritis: Secondary | ICD-10-CM

## 2015-11-08 DIAGNOSIS — J302 Other seasonal allergic rhinitis: Secondary | ICD-10-CM | POA: Diagnosis not present

## 2015-12-05 ENCOUNTER — Non-Acute Institutional Stay (SKILLED_NURSING_FACILITY): Payer: Medicare Other | Admitting: Internal Medicine

## 2015-12-05 ENCOUNTER — Encounter: Payer: Self-pay | Admitting: Internal Medicine

## 2015-12-05 DIAGNOSIS — N184 Chronic kidney disease, stage 4 (severe): Secondary | ICD-10-CM

## 2015-12-05 DIAGNOSIS — J302 Other seasonal allergic rhinitis: Secondary | ICD-10-CM | POA: Diagnosis not present

## 2015-12-05 DIAGNOSIS — K5901 Slow transit constipation: Secondary | ICD-10-CM

## 2015-12-05 NOTE — Progress Notes (Signed)
MRN: [id number] Name: Chelsea [name]  Sex: female Age: 82 y.o. DOB: [birth year]  [facility] Facility/Room: Level Of Care: SNF Provider: Merrilee Seashore D Emergency Contacts: Extended Emergency Contact Information Primary Emergency Contact: Freehling,[name] Address: [address]          [place], [place] Home Phone: [phone] Relation: Other  Code Status:   Allergies: Review of patient's allergies indicates no known allergies.  Chief Complaint  Patient presents with  . Medical Management of Chronic Issues    HPI: Patient is 82 y.o. female who is being seen for routine issues of allergic rhinitis, constipation and CKD4.  Past Medical History  Diagnosis Date  . Diabetes mellitus without complication (HCC)   . Hyperlipidemia   . Hypertension   . PUD (peptic ulcer disease)   . Constipation   . Osteoarthritis   . Chronic pain   . Cholelithiasis     Past Surgical History  Procedure Laterality Date  . Abdominal hysterectomy    . Cystoplasty    . Right hip surgery        Medication List       This list is accurate as of: 12/05/15 11:59 PM.  Always use your most recent med list.               acetaminophen 325 MG tablet  Commonly known as:  TYLENOL  Take 650 mg by mouth every 6 (six) hours as needed. For arthritis pain     amLODipine 10 MG tablet  Commonly known as:  NORVASC  Take 10 mg by mouth daily.     BENGAY GREASELESS EX  Apply 1 application topically 3 (three) times daily as needed. Apply to neck three times daily as needed     fluticasone 50 MCG/ACT nasal spray  Commonly known as:  FLONASE  Place 2 sprays into both nostrils daily.     hydrochlorothiazide 12.5 MG capsule  Commonly known as:  MICROZIDE  Take 12.5 mg by mouth daily.     multivitamin tablet  Take 1 tablet by mouth daily.     polyethylene glycol packet  Commonly known as:  MIRALAX / GLYCOLAX  Take 17 g by mouth daily as needed.     sennosides-docusate sodium 8.6-50 MG tablet   Commonly known as:  SENOKOT-S  Take 2 tablets by mouth daily. For constipation        No orders of the defined types were placed in this encounter.    Immunization History  Administered Date(s) Administered  . Influenza Whole 07/20/2013  . Influenza-Unspecified 07/19/2014  . PPD Test 05/30/2011  . Pneumococcal-Unspecified 05/17/2011    Social History  Substance Use Topics  . Smoking status: Never Smoker   . Smokeless tobacco: Not on file  . Alcohol Use: No    Review of Systems  DATA OBTAINED: from patient, nurse GENERAL:  no fevers, fatigue, appetite changes SKIN: No itching, rash HEENT: No complaint RESPIRATORY: No cough, wheezing, SOB CARDIAC: No chest pain, palpitations, lower extremity edema  GI: No abdominal pain, No N/V/D or constipation, No heartburn or reflux  GU: No dysuria, frequency or urgency, or incontinence  MUSCULOSKELETAL: No unrelieved bone/joint pain NEUROLOGIC: No headache, dizziness  PSYCHIATRIC: No overt anxiety or sadness  Filed Vitals:   12/05/15 1503  BP: 135/68  Pulse: 77  Temp: 98 F (36.7 C)  Resp: 19    Physical Exam  GENERAL APPEARANCE: Alert, conversant, No acute distress  SKIN: No diaphoresis rash HEENT: Unremarkable RESPIRATORY: Breathing is  even, unlabored. Lung sounds are clear   CARDIOVASCULAR: Heart RRR no murmurs, rubs or gallops. No peripheral edema  GASTROINTESTINAL: Abdomen is soft, non-tender, not distended w/ normal bowel sounds.  GENITOURINARY: Bladder non tender, not distended  MUSCULOSKELETAL: No abnormal joints or musculature NEUROLOGIC: Cranial nerves 2-12 grossly intact. Moves all extremities PSYCHIATRIC: Mood and affect appropriate to situation, no behavioral issues  Patient Active Problem List   Diagnosis Date Noted  . Seasonal allergies 09/07/2015  . Candidal intertrigo 08/04/2015  . Chronic renal disease, stage 4, severely decreased glomerular filtration rate between 15-29 mL/min/1.73 square meter  (HCC) 11/19/2013  . Anemia 11/19/2013  . Diabetes mellitus type 2, controlled, without complications (HCC) 01/13/2013  . Hyperlipidemia 01/13/2013  . Essential hypertension, benign 01/13/2013  . PUD (peptic ulcer disease) 01/13/2013  . Constipation 01/13/2013  . Osteoarthritis 01/13/2013    CBC    Component Value Date/Time   WBC 7.8 05/24/2011 0500   RBC 3.94 05/24/2011 0500   HGB 11.6* 05/24/2011 0500   HCT 35.1* 05/24/2011 0500   PLT 185 05/24/2011 0500   MCV 89.1 05/24/2011 0500   LYMPHSABS 1.1 05/22/2011 0500   MONOABS 0.7 05/22/2011 0500   EOSABS 0.3 05/22/2011 0500   BASOSABS 0.0 05/22/2011 0500    CMP     Component Value Date/Time   NA 139 05/24/2011 0500   K 3.6 05/24/2011 0500   CL 100 05/24/2011 0500   CO2 30 05/24/2011 0500   GLUCOSE 92 05/24/2011 0500   BUN 25* 05/24/2011 0500   CREATININE 1.13* 05/24/2011 0500   CALCIUM 9.5 05/24/2011 0500   PROT 6.9 05/18/2011 0540   ALBUMIN 3.7 05/18/2011 0540   AST 15 05/18/2011 0540   ALT 11 05/18/2011 0540   ALKPHOS 101 05/18/2011 0540   BILITOT 0.4 05/18/2011 0540   GFRNONAA 45* 05/24/2011 0500   GFRAA 55* 05/24/2011 0500    Assessment and Plan  Seasonal allergies Controlled on flonase so will contiue flonase for the spring  Constipation Chronic and stable;will continue miralax daily prn and senna s 2 tabs daily  Chronic renal disease, stage 4, severely decreased glomerular filtration rate between 15-29 mL/min/1.73 square meter Pt does not allow blood draws but there is no clinical evidence that CKD has progressed;will cont monitor++    Margit Hanks, MD

## 2015-12-11 NOTE — Assessment & Plan Note (Signed)
Chronic and stable;will continue miralax daily prn and senna s 2 tabs daily

## 2015-12-11 NOTE — Assessment & Plan Note (Signed)
Pt does not allow blood draws but there is no clinical evidence that CKD has progressed;will cont monitor++

## 2015-12-11 NOTE — Assessment & Plan Note (Signed)
Controlled on flonase so will contiue flonase for the spring

## 2015-12-26 ENCOUNTER — Encounter: Payer: Self-pay | Admitting: Adult Health

## 2015-12-26 NOTE — Progress Notes (Signed)
Patient ID: Chelsea [name], female   DOB: 01/20/1919, 82 y.o.   MRN: [id number]    Facility:  [name]       No Known Allergies  Chief Complaint  Patient presents with  . Medical Management of Chronic Issues    HPI:  She is a long term resident of this facility being seen for the management of her chronic illnesses. Overall there is little change in her status. She tells me that she is feeling good and has no complaints today. There are no nursing concerns today.    Past Medical History  Diagnosis Date  . Diabetes mellitus without complication (HCC)   . Hyperlipidemia   . Hypertension   . PUD (peptic ulcer disease)   . Constipation   . Osteoarthritis   . Chronic pain   . Cholelithiasis     Past Surgical History  Procedure Laterality Date  . Abdominal hysterectomy    . Cystoplasty    . Right hip surgery      VITAL SIGNS BP 139/68 mmHg  Pulse 79  Ht 5\' 4"  (1.626 m)  Wt 173 lb (78.472 kg)  BMI 29.68 kg/m2  SpO2 97%  Patient's Medications  New Prescriptions   No medications on file  Previous Medications   ACETAMINOPHEN (TYLENOL) 325 MG TABLET    Take 650 mg by mouth every 6 (six) hours as needed. For arthritis pain   AMLODIPINE (NORVASC) 10 MG TABLET    Take 10 mg by mouth daily.   FLUTICASONE (FLONASE) 50 MCG/ACT NASAL SPRAY    Place 2 sprays into both nostrils daily.   HYDROCHLOROTHIAZIDE (MICROZIDE) 12.5 MG CAPSULE    Take 12.5 mg by mouth daily.   MENTHOL-METHYL SALICYLATE (BENGAY GREASELESS EX)    Apply 1 application topically 3 (three) times daily as needed. Apply to neck three times daily as needed   MULTIPLE VITAMIN (MULTIVITAMIN) TABLET    Take 1 tablet by mouth daily.   POLYETHYLENE GLYCOL (MIRALAX / GLYCOLAX) PACKET    Take 17 g by mouth daily as needed.    SENNOSIDES-DOCUSATE SODIUM (SENOKOT-S) 8.6-50 MG TABLET    Take 2 tablets by mouth daily. For constipation  Modified Medications   No medications on file  Discontinued Medications   No  medications on file     SIGNIFICANT DIAGNOSTIC EXAMS    LABS REVIEWED: DECLINES LABS  08-30-13: wbc 7.2; hgb 9.3; hct 28.3; mcv 90.5; plt 225; gucose 101; bun 54; creat 2.31; k+4.7; na++138; liver normal albumin 3.7  12-03-14: UA: neg      Review of Systems Constitutional: Negative for appetite change and fatigue.   Respiratory: Negative for cough, chest tightness and shortness of breath.   Cardiovascular: Negative for chest pain, palpitations and leg swelling.  Gastrointestinal: Negative for nausea, abdominal pain, diarrhea and constipation.  Musculoskeletal: Negative for myalgias and arthralgias.  Skin: Negative for pallor.  Psychiatric/Behavioral: The patient is not nervous/anxious.     Physical Exam Constitutional: No distress.  Overweight   Eyes: Conjunctivae are normal.  Neck: Neck supple. No JVD present. No thyromegaly present.  Cardiovascular: Normal rate, regular rhythm and intact distal pulses.   Respiratory: Effort normal and breath sounds normal. No respiratory distress. She has no wheezes.  GI: Soft. Bowel sounds are normal. She exhibits no distension. There is no tenderness.  Musculoskeletal: She exhibits no edema.  Able to move all extremities   Lymphadenopathy:    She has no cervical adenopathy.  Neurological: She is alert.  Skin: Skin  is warm and dry. She is not diaphoretic.  Psychiatric: She has a normal mood and affect.   ASSESSMENT/ PLAN:  1. Hypertension: she is stable will continue norvasc 10 mg daily; hctz 12.5 mg daily. She will not allow any blood work to be drawn  2. Osteoarthritis: she is stable will continue bengay to her feet twice daily and tylenol 650 mg every 6 hours as needed   3. Constipation: will continue miralax daily prn and senna s 2 tabs daily  4. Stage IV CKD: she will not allow for lab work to be done in order to monitor her status; will not make changes and will monitor her status   5. PUD: she is denying any complaints of  GI distress; is presently not on medications. Will monitor   6. allergic rhinitis: will continue  flonase nightly and will monitor          Chelsea Innocent NP [facility]  Contact [phone] Monday through Friday 8am- 5pm  After hours call [phone]

## 2016-01-04 ENCOUNTER — Encounter: Payer: Self-pay | Admitting: Adult Health

## 2016-01-04 ENCOUNTER — Non-Acute Institutional Stay (SKILLED_NURSING_FACILITY): Payer: Medicare Other | Admitting: Adult Health

## 2016-01-04 DIAGNOSIS — M150 Primary generalized (osteo)arthritis: Secondary | ICD-10-CM | POA: Diagnosis not present

## 2016-01-04 DIAGNOSIS — I10 Essential (primary) hypertension: Secondary | ICD-10-CM | POA: Diagnosis not present

## 2016-01-04 DIAGNOSIS — J302 Other seasonal allergic rhinitis: Secondary | ICD-10-CM

## 2016-01-04 DIAGNOSIS — M159 Polyosteoarthritis, unspecified: Secondary | ICD-10-CM

## 2016-01-04 DIAGNOSIS — N184 Chronic kidney disease, stage 4 (severe): Secondary | ICD-10-CM | POA: Diagnosis not present

## 2016-01-04 NOTE — Progress Notes (Signed)
Patient ID: Chelsea [name], female   DOB: 14-May-1919, 82 y.o.   MRN: [id number]   Facility:  [name]       No Known Allergies  Chief Complaint  Patient presents with  . Medical Management of Chronic Issues    H&P    HPI:  She is a long term resident of this facility being seen for her annual exam. Overall her status has been stable overall the past year without hospitalizations. She tells me that she is feeling good and has not complaints. There are no nursing concerns at this time.    Past Medical History  Diagnosis Date  . Diabetes mellitus without complication (HCC)   . Hyperlipidemia   . Hypertension   . PUD (peptic ulcer disease)   . Constipation   . Osteoarthritis   . Chronic pain   . Cholelithiasis   . Essential hypertension, benign 01/13/2013    Past Surgical History  Procedure Laterality Date  . Abdominal hysterectomy    . Cystoplasty    . Right hip surgery      History reviewed. No pertinent family history.  Social History   Social History  . Marital Status: Widowed    Spouse Name: N/A  . Number of Children: N/A  . Years of Education: N/A   Occupational History  . Not on file.   Social History Main Topics  . Smoking status: Never Smoker   . Smokeless tobacco: Not on file  . Alcohol Use: No  . Drug Use: No  . Sexual Activity: Not on file   Other Topics Concern  . Not on file   Social History Narrative     Immunization History  Administered Date(s) Administered  . Influenza Whole 07/20/2013  . Influenza-Unspecified 07/19/2014  . PPD Test 05/30/2011  . Pneumococcal-Unspecified 05/17/2011     VITAL SIGNS BP 125/74 mmHg  Pulse 72  Temp(Src) 97.1 F (36.2 C) (Oral)  Resp 18  Ht  (1.626 m)  Wt 175 lb (79.379 kg)  BMI 30.02 kg/m2  SpO2 96%  Patient's Medications  New Prescriptions   No medications on file  Previous Medications   AMLODIPINE (NORVASC) 10 MG TABLET    Take 10 mg by mouth daily.   FLUTICASONE (FLONASE) 50  MCG/ACT NASAL SPRAY    Place 2 sprays into both nostrils daily.   HYDROCHLOROTHIAZIDE (MICROZIDE) 12.5 MG CAPSULE    Take 12.5 mg by mouth daily.   MENTHOL-METHYL SALICYLATE (BENGAY GREASELESS EX)    Apply 1 application topically 2 (two) times daily as needed. Apply to neck three times daily as needed   MULTIPLE VITAMIN (MULTIVITAMIN) TABLET    Take 1 tablet by mouth daily.   NYSTATIN (MYCOSTATIN) POWDER    Apply topically as needed (Apply under breasts prn).   SENNOSIDES-DOCUSATE SODIUM (SENOKOT-S) 8.6-50 MG TABLET    Take 2 tablets by mouth daily. For constipation  Modified Medications   No medications on file  Discontinued Medications     SIGNIFICANT DIAGNOSTIC EXAMS  LABS REVIEWED: DECLINES LABS  08-30-13: wbc 7.2; hgb 9.3; hct 28.3; mcv 90.5; plt 225; gucose 101; bun 54; creat 2.31; k+4.7; na++138; liver normal albumin 3.7  12-03-14: UA: neg      Review of Systems Constitutional: Negative for appetite change and fatigue.   Respiratory: Negative for cough, chest tightness and shortness of breath.   Cardiovascular: Negative for chest pain, palpitations and leg swelling.  Gastrointestinal: Negative for nausea, abdominal pain, diarrhea and constipation.  Musculoskeletal: Negative for  myalgias and arthralgias.  Skin: Negative for pallor.  Psychiatric/Behavioral: The patient is not nervous/anxious.     Physical Exam Constitutional: No distress.  Overweight   Eyes: Conjunctivae are normal.  Neck: Neck supple. No JVD present. No thyromegaly present.  Cardiovascular: Normal rate, regular rhythm and intact distal pulses.   Respiratory: Effort normal and breath sounds normal. No respiratory distress. She has no wheezes.  GI: Soft. Bowel sounds are normal. She exhibits no distension. There is no tenderness.  Musculoskeletal: She exhibits no edema.  Able to move all extremities   Lymphadenopathy:    She has no cervical adenopathy.  Neurological: She is alert.  Skin: Skin is warm  and dry. She is not diaphoretic.  Psychiatric: She has a normal mood and affect.   ASSESSMENT/ PLAN:  1. Hypertension: she is stable will continue norvasc 10 mg daily; hctz 12.5 mg daily. She will not allow any blood work to be drawn  2. Osteoarthritis: she is stable will continue bengay to her feet twice daily and tylenol 650 mg every 6 hours as needed   3. Constipation: will continue miralax daily prn and senna s 2 tabs daily  4. Stage IV CKD: she will not allow for lab work to be done in order to monitor her status; will not make changes and will monitor her status   5. PUD: she is denying any complaints of GI distress; is presently not on medications. Will monitor   6. allergic rhinitis: will continue  flonase nightly and will monitor      Her health maintenance is up to date   Time spent with patient  50  minutes >50% time spent counseling; reviewing medical record; tests; labs; and developing future plan of care    Synthia Innocenteborah Green NP [facility]  Contact [phone] Monday through Friday 8am- 5pm  After hours call [phone]

## 2016-01-31 ENCOUNTER — Encounter: Payer: Self-pay | Admitting: Adult Health

## 2016-01-31 ENCOUNTER — Non-Acute Institutional Stay (SKILLED_NURSING_FACILITY): Payer: Medicare Other | Admitting: Adult Health

## 2016-01-31 DIAGNOSIS — M150 Primary generalized (osteo)arthritis: Secondary | ICD-10-CM

## 2016-01-31 DIAGNOSIS — K279 Peptic ulcer, site unspecified, unspecified as acute or chronic, without hemorrhage or perforation: Secondary | ICD-10-CM

## 2016-01-31 DIAGNOSIS — I10 Essential (primary) hypertension: Secondary | ICD-10-CM

## 2016-01-31 DIAGNOSIS — J302 Other seasonal allergic rhinitis: Secondary | ICD-10-CM

## 2016-01-31 DIAGNOSIS — N184 Chronic kidney disease, stage 4 (severe): Secondary | ICD-10-CM

## 2016-01-31 DIAGNOSIS — M159 Polyosteoarthritis, unspecified: Secondary | ICD-10-CM

## 2016-01-31 DIAGNOSIS — K5901 Slow transit constipation: Secondary | ICD-10-CM | POA: Diagnosis not present

## 2016-01-31 NOTE — Progress Notes (Signed)
Patient ID: Chelsea [name], female   DOB: [birth year], 82 y.o.   MRN: [id number]   Facility:  [name]       No Known Allergies  Chief Complaint  Patient presents with  . Medical Management of Chronic Issues    Follow up    HPI:  She is a long term resident of this facility being seen for management of her chronic illnesses. Overall there is little change in her status. She does tell me that her hands get numb periodically. We did discuss lab work and she has declined. There are no nursing issues at this time.     Past Medical History  Diagnosis Date  . Diabetes mellitus without complication (HCC)   . Hyperlipidemia   . Hypertension   . PUD (peptic ulcer disease)   . Constipation   . Osteoarthritis   . Chronic pain   . Cholelithiasis   . Essential hypertension, benign 01/13/2013    Past Surgical History  Procedure Laterality Date  . Abdominal hysterectomy    . Cystoplasty    . Right hip surgery      VITAL SIGNS BP 134/70 mmHg  Pulse 80  Temp(Src) 97 F (36.1 C) (Oral)  Resp 18  Ht  (1.626 m)  Wt 176 lb 4 oz (79.946 kg)  BMI 30.24 kg/m2  SpO2 95%  Patient's Medications  New Prescriptions   No medications on file  Previous Medications   AMLODIPINE (NORVASC) 10 MG TABLET    Take 10 mg by mouth daily.   FLUTICASONE (FLONASE) 50 MCG/ACT NASAL SPRAY    Place 2 sprays into both nostrils daily.   HYDROCHLOROTHIAZIDE (MICROZIDE) 12.5 MG CAPSULE    Take 12.5 mg by mouth daily.   MENTHOL-METHYL SALICYLATE (BENGAY GREASELESS EX)    Apply 1 application topically 2 (two) times daily as needed. Apply to neck three times daily as needed   MULTIPLE VITAMIN (MULTIVITAMIN) TABLET    Take 1 tablet by mouth daily.   NYSTATIN (MYCOSTATIN) POWDER    Apply topically as needed (Apply under breasts prn).   SENNOSIDES-DOCUSATE SODIUM (SENOKOT-S) 8.6-50 MG TABLET    Take 2 tablets by mouth daily. For constipation  Modified Medications   No medications on file  Discontinued  Medications   No medications on file     SIGNIFICANT DIAGNOSTIC EXAMS  LABS REVIEWED: DECLINES LABS  08-30-13: wbc 7.2; hgb 9.3; hct 28.3; mcv 90.5; plt 225; gucose 101; bun 54; creat 2.31; k+4.7; na++138; liver normal albumin 3.7  12-03-14: UA: neg      Review of Systems Constitutional: Negative for appetite change and fatigue.   Respiratory: Negative for cough, chest tightness and shortness of breath.   Cardiovascular: Negative for chest pain, palpitations and leg swelling.  Gastrointestinal: Negative for nausea, abdominal pain, diarrhea and constipation.  Musculoskeletal: Negative for myalgias and arthralgias. hands get numb periodically Skin: Negative for pallor.  Psychiatric/Behavioral: The patient is not nervous/anxious.     Physical Exam Constitutional: No distress.  Overweight   Eyes: Conjunctivae are normal.  Neck: Neck supple. No JVD present. No thyromegaly present.  Cardiovascular: Normal rate, regular rhythm and intact distal pulses.   Respiratory: Effort normal and breath sounds normal. No respiratory distress. She has no wheezes.  GI: Soft. Bowel sounds are normal. She exhibits no distension. There is no tenderness.  Musculoskeletal: She exhibits no edema.  Able to move all extremities   Lymphadenopathy:    She has no cervical adenopathy.  Neurological: She is alert.  Skin: Skin is warm and dry. She is not diaphoretic.  Psychiatric: She has a normal mood and affect.   ASSESSMENT/ PLAN:  1. Hypertension: she is stable will continue norvasc 10 mg daily; hctz 12.5 mg daily. She will not allow any blood work to be drawn  2. Osteoarthritis: she is stable will continue bengay to her feet twice daily and tylenol 650 mg every 6 hours as needed   3. Constipation: will continue miralax daily prn and senna s 2 tabs daily  4. Stage IV CKD: she will not allow for lab work to be done in order to monitor her status; will not make changes and will monitor her status    5. PUD: she is denying any complaints of GI distress; is presently not on medications. Will monitor   6. allergic rhinitis: will continue  flonase nightly and will monitor      Synthia Innocenteborah Gianlucas Evenson NP [facility]  Contact [phone] Monday through Friday 8am- 5pm  After hours call [phone]

## 2016-03-01 ENCOUNTER — Non-Acute Institutional Stay (SKILLED_NURSING_FACILITY): Payer: Medicare Other | Admitting: Internal Medicine

## 2016-03-01 ENCOUNTER — Encounter: Payer: Self-pay | Admitting: Internal Medicine

## 2016-03-01 DIAGNOSIS — K5901 Slow transit constipation: Secondary | ICD-10-CM | POA: Diagnosis not present

## 2016-03-01 DIAGNOSIS — M150 Primary generalized (osteo)arthritis: Secondary | ICD-10-CM | POA: Diagnosis not present

## 2016-03-01 DIAGNOSIS — K279 Peptic ulcer, site unspecified, unspecified as acute or chronic, without hemorrhage or perforation: Secondary | ICD-10-CM | POA: Diagnosis not present

## 2016-03-01 DIAGNOSIS — M159 Polyosteoarthritis, unspecified: Secondary | ICD-10-CM

## 2016-03-01 NOTE — Assessment & Plan Note (Signed)
stable will continue bengay to her feet twice daily and tylenol 650 mg every 6 hours as needed

## 2016-03-01 NOTE — Assessment & Plan Note (Signed)
No c/o GI distress or reflux on no meds; will monitor

## 2016-03-01 NOTE — Progress Notes (Signed)
MRN: [id number] Name: Chelsea [name]  Sex: female Age: 82 y.o. DOB: Mar 04, 1919  PSC #: Ronni Rumble Facility/Room:228 B Level Of Care: SNF Provider: Randon Goldsmith. Lyn Hollingshead, MD Emergency Contacts: Extended Emergency Contact Information Primary Emergency Contact: Kearn,[name] Address: [address]          [place], [place] Home Phone: [phone] Relation: Other  Code Status: Full Code  Allergies: Review of patient's allergies indicates no known allergies.  Chief Complaint  Patient presents with  . Medical Management of Chronic Issues    Routine Visit    HPI: Patient is 82 y.o. female who is being seen for routine issues of OA, constipation and PUD.  Past Medical History  Diagnosis Date  . Diabetes mellitus without complication (HCC)   . Hyperlipidemia   . Hypertension   . PUD (peptic ulcer disease)   . Constipation   . Osteoarthritis   . Chronic pain   . Cholelithiasis   . Essential hypertension, benign 01/13/2013    Past Surgical History  Procedure Laterality Date  . Abdominal hysterectomy    . Cystoplasty    . Right hip surgery        Medication List       This list is accurate as of: 03/01/16  9:17 PM.  Always use your most recent med list.               amLODipine 10 MG tablet  Commonly known as:  NORVASC  Take 10 mg by mouth daily.     BENGAY GREASELESS EX  Apply 1 application topically 2 (two) times daily as needed. Apply to neck three times daily as needed     fluticasone 50 MCG/ACT nasal spray  Commonly known as:  FLONASE  Place 2 sprays into both nostrils daily.     hydrochlorothiazide 12.5 MG capsule  Commonly known as:  MICROZIDE  Take 12.5 mg by mouth daily.     multivitamin tablet  Take 1 tablet by mouth daily.     nystatin powder  Commonly known as:  MYCOSTATIN  Apply topically as needed (Apply under breasts prn).     OPTIVE 0.5-0.9 % Soln  Generic drug:  Carboxymethylcellul-Glycerin  Apply to eye. Instill 2 drops into both eyes three  times a day for dry eyes     sennosides-docusate sodium 8.6-50 MG tablet  Commonly known as:  SENOKOT-S  Take 2 tablets by mouth daily. For constipation        Meds ordered this encounter  Medications  . Carboxymethylcellul-Glycerin (OPTIVE) 0.5-0.9 % SOLN    Sig: Apply to eye. Instill 2 drops into both eyes three times a day for dry eyes    Immunization History  Administered Date(s) Administered  . Influenza Whole 07/20/2013  . Influenza-Unspecified 07/19/2014  . PPD Test 05/30/2011  . Pneumococcal-Unspecified 05/17/2011    Social History  Substance Use Topics  . Smoking status: Never Smoker   . Smokeless tobacco: Not on file  . Alcohol Use: No    Review of Systems  DATA OBTAINED: from patient, nurse GENERAL:  no fevers, fatigue, appetite changes SKIN: No itching, rash HEENT: No complaint RESPIRATORY: No cough, wheezing, SOB CARDIAC: No chest pain, palpitations, lower extremity edema  GI: No abdominal pain, No N/V/D or constipation, No heartburn or reflux  GU: No dysuria, frequency or urgency, or incontinence  MUSCULOSKELETAL: No unrelieved bone/joint pain NEUROLOGIC: No headache, dizziness  PSYCHIATRIC: No overt anxiety or sadness  Filed Vitals:   03/01/16 0926  BP: 145/75  Pulse: 78  Temp: 98.6 F (37 C)  Resp: 18    Physical Exam  GENERAL APPEARANCE: Alert, conversant, No acute distress  SKIN: No diaphoresis rash HEENT: Unremarkable RESPIRATORY: Breathing is even, unlabored. Lung sounds are clear   CARDIOVASCULAR: Heart RRR no murmurs, rubs or gallops. No peripheral edema  GASTROINTESTINAL: Abdomen is soft, non-tender, not distended w/ normal bowel sounds.  GENITOURINARY: Bladder non tender, not distended  MUSCULOSKELETAL: No abnormal joints or musculature NEUROLOGIC: Cranial nerves 2-12 grossly intact. Moves all extremities PSYCHIATRIC: Mood and affect appropriate to situation, no behavioral issues  Patient Active Problem List   Diagnosis Date  Noted  . Seasonal allergies 09/07/2015  . Candidal intertrigo 08/04/2015  . Chronic renal disease, stage 4, severely decreased glomerular filtration rate between 15-29 mL/min/1.73 square meter (HCC) 11/19/2013  . Anemia 11/19/2013  . Diabetes mellitus type 2, controlled, without complications (HCC) 01/13/2013  . Hyperlipidemia 01/13/2013  . Essential hypertension, benign 01/13/2013  . PUD (peptic ulcer disease) 01/13/2013  . Constipation 01/13/2013  . Osteoarthritis 01/13/2013    CBC    Component Value Date/Time   WBC 7.8 05/24/2011 0500   RBC 3.94 05/24/2011 0500   HGB 11.6* 05/24/2011 0500   HCT 35.1* 05/24/2011 0500   PLT 185 05/24/2011 0500   MCV 89.1 05/24/2011 0500   LYMPHSABS 1.1 05/22/2011 0500   MONOABS 0.7 05/22/2011 0500   EOSABS 0.3 05/22/2011 0500   BASOSABS 0.0 05/22/2011 0500    CMP     Component Value Date/Time   NA 139 05/24/2011 0500   K 3.6 05/24/2011 0500   CL 100 05/24/2011 0500   CO2 30 05/24/2011 0500   GLUCOSE 92 05/24/2011 0500   BUN 25* 05/24/2011 0500   CREATININE 1.13* 05/24/2011 0500   CALCIUM 9.5 05/24/2011 0500   PROT 6.9 05/18/2011 0540   ALBUMIN 3.7 05/18/2011 0540   AST 15 05/18/2011 0540   ALT 11 05/18/2011 0540   ALKPHOS 101 05/18/2011 0540   BILITOT 0.4 05/18/2011 0540   GFRNONAA 45* 05/24/2011 0500   GFRAA 55* 05/24/2011 0500    Assessment and Plan  Osteoarthritis stable will continue bengay to her feet twice daily and tylenol 650 mg every 6 hours as needed   Constipation will continue miralax daily prn and senna s 2 tabs daily  PUD (peptic ulcer disease) No c/o GI distress or reflux on no meds; will monitor    Neta Upadhyay D. Lyn HollingsheadAlexander, MD

## 2016-03-01 NOTE — Assessment & Plan Note (Signed)
will continue miralax daily prn and senna s 2 tabs daily

## 2016-04-03 ENCOUNTER — Encounter: Payer: Self-pay | Admitting: Adult Health

## 2016-04-03 ENCOUNTER — Non-Acute Institutional Stay (SKILLED_NURSING_FACILITY): Payer: Medicare Other | Admitting: Adult Health

## 2016-04-03 DIAGNOSIS — J302 Other seasonal allergic rhinitis: Secondary | ICD-10-CM

## 2016-04-03 DIAGNOSIS — K5901 Slow transit constipation: Secondary | ICD-10-CM | POA: Diagnosis not present

## 2016-04-03 DIAGNOSIS — K279 Peptic ulcer, site unspecified, unspecified as acute or chronic, without hemorrhage or perforation: Secondary | ICD-10-CM | POA: Diagnosis not present

## 2016-04-03 DIAGNOSIS — I10 Essential (primary) hypertension: Secondary | ICD-10-CM

## 2016-04-03 DIAGNOSIS — N184 Chronic kidney disease, stage 4 (severe): Secondary | ICD-10-CM | POA: Diagnosis not present

## 2016-04-03 NOTE — Progress Notes (Signed)
Patient ID: Chelsea [name], female   DOB: 02/26/1919, 82 y.o.   MRN: [id number]   Location:     [name] Room Number: 228-B Place of Service:  SNF (31)   CODE STATUS: Full Code  No Known Allergies  Chief Complaint  Patient presents with  . Medical Management of Chronic Issues    Follow up    HPI:  She is a long term resident of this facility being seen for the management of her chronic illnesses. Overall her status has not changed. She has declined for labs to be drawn. She tells me that she is feeling good and has no complaints. There are no nursing concerns at this time.    Past Medical History  Diagnosis Date  . Diabetes mellitus without complication (HCC)   . Hyperlipidemia   . Hypertension   . PUD (peptic ulcer disease)   . Constipation   . Osteoarthritis   . Chronic pain   . Cholelithiasis   . Essential hypertension, benign 01/13/2013    Past Surgical History  Procedure Laterality Date  . Abdominal hysterectomy    . Cystoplasty    . Right hip surgery      Social History   Social History  . Marital Status: Widowed    Spouse Name: N/A  . Number of Children: N/A  . Years of Education: N/A   Occupational History  . Not on file.   Social History Main Topics  . Smoking status: Never Smoker   . Smokeless tobacco: Not on file  . Alcohol Use: No  . Drug Use: No  . Sexual Activity: Not on file   Other Topics Concern  . Not on file   Social History Narrative   History reviewed. No pertinent family history.    VITAL SIGNS BP 130/80 mmHg  Pulse 66  Temp(Src) 97.6 F (36.4 C) (Oral)  Resp 18  Ht 5\' 4"  (1.626 m)  Wt 171 lb (77.565 kg)  BMI 29.34 kg/m2  SpO2 98%  Patient's Medications  New Prescriptions   No medications on file  Previous Medications   AMLODIPINE (NORVASC) 10 MG TABLET    Take 10 mg by mouth daily.   CARBOXYMETHYLCELLUL-GLYCERIN (OPTIVE) 0.5-0.9 % SOLN    Apply to eye. Instill 2 drops into both eyes three times a  day for dry eyes   FLUTICASONE (FLONASE) 50 MCG/ACT NASAL SPRAY    Place 2 sprays into both nostrils daily.   HYDROCHLOROTHIAZIDE (MICROZIDE) 12.5 MG CAPSULE    Take 12.5 mg by mouth daily.   MENTHOL-METHYL SALICYLATE (BENGAY GREASELESS EX)    Apply 1 application topically 2 (two) times daily as needed. Apply to neck three times daily as needed   MULTIPLE VITAMIN (MULTIVITAMIN) TABLET    Take 1 tablet by mouth daily.   NYSTATIN (MYCOSTATIN) POWDER    Apply topically as needed (Apply under breasts prn).   SENNOSIDES-DOCUSATE SODIUM (SENOKOT-S) 8.6-50 MG TABLET    Take 2 tablets by mouth daily. For constipation  Modified Medications   No medications on file  Discontinued Medications   No medications on file     SIGNIFICANT DIAGNOSTIC EXAMS  LABS REVIEWED: DECLINES LABS  08-30-13: wbc 7.2; hgb 9.3; hct 28.3; mcv 90.5; plt 225; gucose 101; bun 54; creat 2.31; k+4.7; na++138; liver normal albumin 3.7  12-03-14: UA: neg      Review of Systems Constitutional: Negative for appetite change and fatigue.   Respiratory: Negative for cough, chest tightness and shortness of  breath.   Cardiovascular: Negative for chest pain, palpitations and leg swelling.  Gastrointestinal: Negative for nausea, abdominal pain, diarrhea and constipation.  Musculoskeletal: Negative for myalgias and arthralgias. hands get numb periodically Skin: Negative for pallor.  Psychiatric/Behavioral: The patient is not nervous/anxious.     Physical Exam Constitutional: No distress.  Overweight   Eyes: Conjunctivae are normal.  Neck: Neck supple. No JVD present. No thyromegaly present.  Cardiovascular: Normal rate, regular rhythm and intact distal pulses.   Respiratory: Effort normal and breath sounds normal. No respiratory distress. She has no wheezes.  GI: Soft. Bowel sounds are normal. She exhibits no distension. There is no tenderness.  Musculoskeletal: She exhibits no edema.  Able to move all extremities     Lymphadenopathy:    She has no cervical adenopathy.  Neurological: She is alert.  Skin: Skin is warm and dry. She is not diaphoretic.  Psychiatric: She has a normal mood and affect.   ASSESSMENT/ PLAN:  1. Hypertension: she is stable will continue norvasc 10 mg daily; hctz 12.5 mg daily. She will not allow any blood work to be drawn  2. Osteoarthritis: she is stable will continue bengay to her feet twice daily and tylenol 650 mg every 6 hours as needed   3. Constipation: will continue senna s 2 tabs daily  4. Stage IV CKD: she will not allow for lab work to be done in order to monitor her status; will not make changes and will monitor her status   5. PUD: she is denying any complaints of GI distress; is presently not on medications. Will monitor   6. allergic rhinitis: will continue  flonase nightly and will monitor     Synthia Innocenteborah Mickie Kozikowski NP [facility]  Contact [phone] Monday through Friday 8am- 5pm  After hours call [phone]

## 2016-05-07 ENCOUNTER — Non-Acute Institutional Stay (SKILLED_NURSING_FACILITY): Payer: Medicare Other | Admitting: Internal Medicine

## 2016-05-07 DIAGNOSIS — M159 Polyosteoarthritis, unspecified: Secondary | ICD-10-CM

## 2016-05-07 DIAGNOSIS — I10 Essential (primary) hypertension: Secondary | ICD-10-CM | POA: Diagnosis not present

## 2016-05-07 DIAGNOSIS — M150 Primary generalized (osteo)arthritis: Secondary | ICD-10-CM | POA: Diagnosis not present

## 2016-05-07 DIAGNOSIS — N184 Chronic kidney disease, stage 4 (severe): Secondary | ICD-10-CM | POA: Diagnosis not present

## 2016-06-10 ENCOUNTER — Encounter: Payer: Self-pay | Admitting: Internal Medicine

## 2016-06-10 NOTE — Progress Notes (Signed)
MRN: [id number] Name: Chelsea [name]  Sex: female Age: 82 y.o. DOB: 25-Jun-1919  PSC #:  Facility/Room:[name] Level Of Care: SNF Provider: Merrilee Seashore D Emergency Contacts: Extended Emergency Contact Information Primary Emergency Contact: Sitton,[name] Address: [address]          [place], [place] Home Phone: [phone] Relation: Other  Code Status:   Allergies: Review of patient's allergies indicates no known allergies.  Chief Complaint  Patient presents with  . Medical Management of Chronic Issues    HPI: Patient is 82 y.o. female who is being seen for routine issues of HTN, OA and CKD4.  Past Medical History:  Diagnosis Date  . Cholelithiasis   . Chronic pain   . Constipation   . Diabetes mellitus without complication (HCC)   . Essential hypertension, benign 01/13/2013  . Hyperlipidemia   . Hypertension   . Osteoarthritis   . PUD (peptic ulcer disease)     Past Surgical History:  Procedure Laterality Date  . ABDOMINAL HYSTERECTOMY    . CYSTOPLASTY    . right hip surgery        Medication List       Accurate as of 05/07/16 11:59 PM. Always use your most recent med list.          amLODipine 10 MG tablet Commonly known as:  NORVASC Take 10 mg by mouth daily.   BENGAY GREASELESS EX Apply 1 application topically 2 (two) times daily as needed. Apply to neck three times daily as needed   fluticasone 50 MCG/ACT nasal spray Commonly known as:  FLONASE Place 2 sprays into both nostrils daily.   hydrochlorothiazide 12.5 MG capsule Commonly known as:  MICROZIDE Take 12.5 mg by mouth daily.   multivitamin tablet Take 1 tablet by mouth daily.   nystatin powder Commonly known as:  MYCOSTATIN/NYSTOP Apply topically as needed (Apply under breasts prn).   OPTIVE 0.5-0.9 % Soln Generic drug:  Carboxymethylcellul-Glycerin Apply to eye. Instill 2 drops into both eyes three times a day for dry eyes   sennosides-docusate sodium 8.6-50 MG  tablet Commonly known as:  SENOKOT-S Take 2 tablets by mouth daily. For constipation       No orders of the defined types were placed in this encounter.   Immunization History  Administered Date(s) Administered  . Influenza Whole 07/20/2013  . Influenza-Unspecified 07/19/2014  . PPD Test 05/30/2011  . Pneumococcal-Unspecified 05/17/2011    Social History  Substance Use Topics  . Smoking status: Never Smoker  . Smokeless tobacco: Not on file  . Alcohol use No    Review of Systems  DATA OBTAINED: from patient, nurse GENERAL:  no fevers, fatigue, appetite changes SKIN: No itching, rash HEENT: No complaint RESPIRATORY: No cough, wheezing, SOB CARDIAC: No chest pain, palpitations, lower extremity edema  GI: No abdominal pain, No N/V/D or constipation, No heartburn or reflux  GU: No dysuria, frequency or urgency, or incontinence  MUSCULOSKELETAL: No unrelieved bone/joint pain NEUROLOGIC: No headache, dizziness  PSYCHIATRIC: No overt anxiety or sadness  Vitals:   06/10/16 1512  BP: 132/81  Pulse: 76  Resp: 18  Temp: 97.9 F (36.6 C)    Physical Exam  GENERAL APPEARANCE: Alert, conversant, No acute distress  SKIN: No diaphoresis rash HEENT: Unremarkable RESPIRATORY: Breathing is even, unlabored. Lung sounds are clear   CARDIOVASCULAR: Heart RRR no murmurs, rubs or gallops. No peripheral edema  GASTROINTESTINAL: Abdomen is soft, non-tender, not distended w/ normal bowel sounds.  GENITOURINARY: Bladder non tender, not distended  MUSCULOSKELETAL: No abnormal joints or musculature NEUROLOGIC: Cranial nerves 2-12 grossly intact. Moves all extremities PSYCHIATRIC: Mood and affect appropriate to situation, no behavioral issues  Patient Active Problem List   Diagnosis Date Noted  . Seasonal allergies 09/07/2015  . Candidal intertrigo 08/04/2015  . Chronic renal disease, stage 4, severely decreased glomerular filtration rate between 15-29 mL/min/1.73 square meter (HCC)  11/19/2013  . Anemia 11/19/2013  . Diabetes mellitus type 2, controlled, without complications (HCC) 01/13/2013  . Hyperlipidemia 01/13/2013  . Essential hypertension, benign 01/13/2013  . PUD (peptic ulcer disease) 01/13/2013  . Constipation 01/13/2013  . Osteoarthritis 01/13/2013    CBC    Component Value Date/Time   WBC 7.8 05/24/2011 0500   RBC 3.94 05/24/2011 0500   HGB 11.6 (L) 05/24/2011 0500   HCT 35.1 (L) 05/24/2011 0500   PLT 185 05/24/2011 0500   MCV 89.1 05/24/2011 0500   LYMPHSABS 1.1 05/22/2011 0500   MONOABS 0.7 05/22/2011 0500   EOSABS 0.3 05/22/2011 0500   BASOSABS 0.0 05/22/2011 0500    CMP     Component Value Date/Time   NA 139 05/24/2011 0500   K 3.6 05/24/2011 0500   CL 100 05/24/2011 0500   CO2 30 05/24/2011 0500   GLUCOSE 92 05/24/2011 0500   BUN 25 (H) 05/24/2011 0500   CREATININE 1.13 (H) 05/24/2011 0500   CALCIUM 9.5 05/24/2011 0500   PROT 6.9 05/18/2011 0540   ALBUMIN 3.7 05/18/2011 0540   AST 15 05/18/2011 0540   ALT 11 05/18/2011 0540   ALKPHOS 101 05/18/2011 0540   BILITOT 0.4 05/18/2011 0540   GFRNONAA 45 (L) 05/24/2011 0500   GFRAA 55 (L) 05/24/2011 0500    Assessment and Plan  Hypertension: she is stable will continue norvasc 10 mg daily; hctz 12.5 mg daily. She will not allow any blood work to be drawn  Osteoarthritis: she is stable will continue bengay to her feet twice daily and tylenol 650 mg every 6 hours as needed   Stage IV CKD: she will not allow for lab work to be done in order to monitor her status; will not make changes and will monitor her clinically     Margit HanksALEXANDER, Keila Turan D, MD

## 2016-06-12 ENCOUNTER — Encounter: Payer: Self-pay | Admitting: Internal Medicine

## 2016-06-12 ENCOUNTER — Non-Acute Institutional Stay (SKILLED_NURSING_FACILITY): Payer: Medicare Other | Admitting: Internal Medicine

## 2016-06-12 DIAGNOSIS — M150 Primary generalized (osteo)arthritis: Secondary | ICD-10-CM | POA: Diagnosis not present

## 2016-06-12 DIAGNOSIS — I10 Essential (primary) hypertension: Secondary | ICD-10-CM

## 2016-06-12 DIAGNOSIS — J302 Other seasonal allergic rhinitis: Secondary | ICD-10-CM

## 2016-06-12 DIAGNOSIS — N184 Chronic kidney disease, stage 4 (severe): Secondary | ICD-10-CM | POA: Diagnosis not present

## 2016-06-12 DIAGNOSIS — M159 Polyosteoarthritis, unspecified: Secondary | ICD-10-CM

## 2016-06-12 NOTE — Progress Notes (Signed)
Patient ID: Chelsea [name], female   DOB: 12/04/1918, 82 y.o.   MRN: [id number]   Location:   [name] Room Number: 228-B Place of Service:  SNF (31) Provider:  Edmon CrapeArlo Lassen, PA-C  Kirt BoysMonica Carter, DO  Patient Care Team: Margit HanksAnne D Alexander, MD as PCP - General (Internal Medicine) Sharee Holstereborah S Green, NP as Nurse Practitioner (Nurse Practitioner) [facility] ([facility] Facility)  Extended Emergency Contact Information Primary Emergency Contact: Queenan,[name] Address: [address]          [place], [place] Home Phone: [phone] Relation: Other  Code Status:  Full Code Goals of care: Advanced Directive information Advanced Directives 03/01/2016  Does patient have an advance directive? No     Chief Complaint  Patient presents with  . Medical Management of Chronic Issues    Follow up   Follow-up of chronic medical conditions including hypertension-osteoarthritis-constipation-stage IV chronic kidney disease peptic ulcer disease and allergic rhinitis HPI:  Pt is a 82 y.o. female seen today for medical management of chronic diseases.   As noted above.  She continues to be stable nursing does not report any acute issues other than apparently erythematous rash under her breasts bilaterally they have been putting an antifungal on but would like something else.  Other than those rashes patient has no complaints-she does have some arthritic history-and at times will complain of this-she does have BenGay topical to her feet.  She has a history of refusing lab draws to follow-up her other issues most significantly grade 4 chronic renal disease-nonetheless she appears to be clinically stable.  She is on Norvasc as well as hydrochlorothiazide with her history of hypertension this appears relatively stable with a recent blood pressure 140/80-at this point will monitor.    Past Medical History:  Diagnosis Date  . Cholelithiasis   . Chronic pain   . Constipation     . Diabetes mellitus without complication (HCC)   . Essential hypertension, benign 01/13/2013  . Hyperlipidemia   . Hypertension   . Osteoarthritis   . PUD (peptic ulcer disease)    Past Surgical History:  Procedure Laterality Date  . ABDOMINAL HYSTERECTOMY    . CYSTOPLASTY    . right hip surgery      No Known Allergies    Medication List       Accurate as of 06/12/16  9:59 AM. Always use your most recent med list.          amLODipine 10 MG tablet Commonly known as:  NORVASC Take 10 mg by mouth daily.   BENGAY GREASELESS EX Apply 1 application topically 2 (two) times daily as needed. Apply to neck three times daily as needed   fluticasone 50 MCG/ACT nasal spray Commonly known as:  FLONASE Place 2 sprays into both nostrils daily.   hydrochlorothiazide 12.5 MG capsule Commonly known as:  MICROZIDE Take 12.5 mg by mouth daily.   multivitamin tablet Take 1 tablet by mouth daily.   nystatin powder Commonly known as:  MYCOSTATIN/NYSTOP Apply topically as needed (Apply under breasts prn).   OPTIVE 0.5-0.9 % Soln Generic drug:  Carboxymethylcellul-Glycerin Apply to eye. Instill 2 drops into both eyes three times a day for dry eyes   sennosides-docusate sodium 8.6-50 MG tablet Commonly known as:  SENOKOT-S Take 2 tablets by mouth daily. For constipation       Review of Systems  Constitutional: Negative for appetite change and fatigue weight appears to be stable.   Respiratory: Negative for cough,  chest tightness and shortness of breath.   Cardiovascular: Negative for chest pain, palpitations and leg swelling.  Gastrointestinal: Negative for nausea, abdominal pain, diarrhea and constipation.  Musculoskeletal: Negative for myalgias and acute arthritic pain-does complain at times of  arthritis more so in her feet Skin: Negative for pallor. Positive for rash under her breasts Psychiatric/Behavioral: The patient is not nervous/anxious.    Immunization History   Administered Date(s) Administered  . Influenza Whole 07/20/2013  . Influenza-Unspecified 07/19/2014  . PPD Test 05/30/2011  . Pneumococcal-Unspecified 05/17/2011   Pertinent  Health Maintenance Due  Topic Date Due  . OPHTHALMOLOGY EXAM  10/08/2016 (Originally 11/11/2015)  . INFLUENZA VACCINE  01/04/2044 (Originally 05/08/2016)  . FOOT EXAM  10/24/2016  . DEXA SCAN  Addressed  . PNA vac Low Risk Adult  Completed   Fall Risk  11/10/2014  Falls in the past year? No   Functional Status Survey:    Vitals:   06/12/16 0957  BP: 140/80  Pulse: 69  Resp: (!) 69  Temp: 98.5 F (36.9 C)  TempSrc: Oral  SpO2: 98%  Weight: 172 lb 4 oz (78.1 kg)  Height: 5\' 4"  (1.626 m)  Of note respirations are 18 Body mass index is 29.57 kg/m. Physical Exam  Constitutional: No distress.  Overweight   Eyes: Conjunctivae are normal. He has prescription lenses  .  Cardiovascular: Normal rate, regular rhythm and intact distal pulses.   Respiratory: Effort normal and breath sounds normal. No respiratory distress. She has no wheezes.  GI: Soft. Bowel sounds are normal. She exhibits no distension. There is no tenderness.  Musculoskeletal: She exhibits no edema.  Able to move all extremities   relates in a wheelchair I do not note any deformities of feet other than arthritic     Neurological: She is alert. Wasn't talkative  Skin: Skin is warm and dry. She is not diaphoretic. She does have an erythematous well-defined rash under her breasts bilaterally  Psychiatric: She has a normal mood and affect.   Labs reviewed: No results for input(s): NA, K, CL, CO2, GLUCOSE, BUN, CREATININE, CALCIUM, MG, PHOS in the last 8760 hours. No results for input(s): AST, ALT, ALKPHOS, BILITOT, PROT, ALBUMIN in the last 8760 hours. No results for input(s): WBC, NEUTROABS, HGB, HCT, MCV, PLT in the last 8760 hours. Lab Results  Component Value Date   TSH 2.072 05/18/2011   Lab Results  Component Value Date   HGBA1C  7.0 (H) 05/18/2011   Lab Results  Component Value Date   CHOL 289 (H) 05/18/2011   HDL 40 05/18/2011   LDLCALC 187 (H) 05/18/2011   TRIG 310 (H) 05/18/2011   CHOLHDL 7.2 05/18/2011    Significant Diagnostic Results in last 30 days:  No results found.  Assessment/Plan   1. Hypertension: she is stable will continue norvasc 10 mg daily; hctz 12.5 mg daily. She will not allow any blood work to be drawn--at this point continue to monitor blood pressures most recent systolic of 140 is borderline but with her advanced age and comorbidities would be hesitant to be real aggressive  2. Osteoarthritis: she is stable will continue bengay to her feet twice daily and tylenol 650 mg every 6 hours as needed   3. Constipation: will continue senna s 2 tabs daily  4. Stage IV CKD: she will not allow for lab work to be done in order to monitor her status; will not make changes and will monitor her status will write order to obtain a  metabolic panel when and if patient will allow although I suspect this will be a challenge  5. PUD: she is denying any complaints of GI distress; is presently not on medications. Will monitor   6. allergic rhinitis: will continue  flonase nightly and will monitor   #7 bilateral breast rash-will write an order for Nizoral cream apply thin layer twice a day until resolved if no resolution notify provider.  Again will write an order to obtain a CBC and CMP if and when patient will allow although again obtaining this I suspect will be quite a challenge   [phone]

## 2016-07-18 ENCOUNTER — Encounter: Payer: Self-pay | Admitting: Adult Health

## 2016-07-18 ENCOUNTER — Non-Acute Institutional Stay (SKILLED_NURSING_FACILITY): Payer: Medicare Other | Admitting: Adult Health

## 2016-07-18 DIAGNOSIS — J302 Other seasonal allergic rhinitis: Secondary | ICD-10-CM

## 2016-07-18 DIAGNOSIS — K5901 Slow transit constipation: Secondary | ICD-10-CM | POA: Diagnosis not present

## 2016-07-18 DIAGNOSIS — K279 Peptic ulcer, site unspecified, unspecified as acute or chronic, without hemorrhage or perforation: Secondary | ICD-10-CM | POA: Diagnosis not present

## 2016-07-18 DIAGNOSIS — M159 Polyosteoarthritis, unspecified: Secondary | ICD-10-CM

## 2016-07-18 DIAGNOSIS — M150 Primary generalized (osteo)arthritis: Secondary | ICD-10-CM | POA: Diagnosis not present

## 2016-07-18 DIAGNOSIS — N184 Chronic kidney disease, stage 4 (severe): Secondary | ICD-10-CM

## 2016-07-18 DIAGNOSIS — I10 Essential (primary) hypertension: Secondary | ICD-10-CM | POA: Diagnosis not present

## 2016-07-18 NOTE — Progress Notes (Signed)
Patient ID: Chelsea [name], female   DOB: [birth year], 82 y.o.   MRN: [id number]   Location:   [name] Room Number: 228-B Place of Service:  SNF (31)   CODE STATUS: DNR  No Known Allergies  Chief Complaint  Patient presents with  . Medical Management of Chronic Issues    Follow up    HPI:  She is a long term resident of this facility being seen for the management of her chronic illnesses. Overall her status is stable. She does get out of bed daily. She will not allow for blood work. She is not voicing any complaints. There are no nursing concerns at this time.    Past Medical History:  Diagnosis Date  . Cholelithiasis   . Chronic pain   . Constipation   . Diabetes mellitus without complication (HCC)   . Essential hypertension, benign 01/13/2013  . Hyperlipidemia   . Hypertension   . Osteoarthritis   . PUD (peptic ulcer disease)     Past Surgical History:  Procedure Laterality Date  . ABDOMINAL HYSTERECTOMY    . CYSTOPLASTY    . right hip surgery      Social History   Social History  . Marital status: Widowed    Spouse name: N/A  . Number of children: N/A  . Years of education: N/A   Occupational History  . Not on file.   Social History Main Topics  . Smoking status: Never Smoker  . Smokeless tobacco: Not on file  . Alcohol use No  . Drug use: No  . Sexual activity: Not on file   Other Topics Concern  . Not on file   Social History Narrative  . No narrative on file   History reviewed. No pertinent family history.    VITAL SIGNS BP 136/75   Pulse 79   Temp 98.2 F (36.8 C) (Oral)   Resp 20   Ht 5\' 4"  (1.626 m)   Wt 173 lb 2 oz (78.5 kg)   SpO2 92%   BMI 29.72 kg/m   Patient's Medications  New Prescriptions   No medications on file  Previous Medications   AMLODIPINE (NORVASC) 10 MG TABLET    Take 10 mg by mouth daily.   CARBOXYMETHYLCELLUL-GLYCERIN (OPTIVE) 0.5-0.9 % SOLN    Apply to eye. Instill 2 drops into both eyes  three times a day for dry eyes   HYDROCHLOROTHIAZIDE (MICROZIDE) 12.5 MG CAPSULE    Take 12.5 mg by mouth daily.   MENTHOL-METHYL SALICYLATE (BENGAY GREASELESS EX)    Apply 1 application topically 2 (two) times daily as needed. Apply to neck three times daily as needed   MULTIPLE VITAMIN (MULTIVITAMIN) TABLET    Take 1 tablet by mouth daily.   NYSTATIN (MYCOSTATIN) POWDER    Apply topically as needed (Apply under breasts prn).   SENNOSIDES-DOCUSATE SODIUM (SENOKOT-S) 8.6-50 MG TABLET    Take 2 tablets by mouth daily. For constipation  Modified Medications   No medications on file  Discontinued Medications   FLUTICASONE (FLONASE) 50 MCG/ACT NASAL SPRAY    Place 2 sprays into both nostrils daily.     SIGNIFICANT DIAGNOSTIC EXAMS  LABS REVIEWED: DECLINES LABS  08-30-13: wbc 7.2; hgb 9.3; hct 28.3; mcv 90.5; plt 225; gucose 101; bun 54; creat 2.31; k+4.7; na++138; liver normal albumin 3.7  12-03-14: UA: neg      Review of Systems Constitutional: Negative for appetite change and fatigue.   Respiratory: Negative for cough, chest tightness  and shortness of breath.   Cardiovascular: Negative for chest pain, palpitations and leg swelling.  Gastrointestinal: Negative for nausea, abdominal pain, diarrhea and constipation.  Musculoskeletal: Negative for myalgias and arthralgias.  Skin: Negative for pallor.  Psychiatric/Behavioral: The patient is not nervous/anxious.     Physical Exam Constitutional: No distress.  Overweight   Eyes: Conjunctivae are normal.  Neck: Neck supple. No JVD present. No thyromegaly present.  Cardiovascular: Normal rate, regular rhythm and intact distal pulses.   Respiratory: Effort normal and breath sounds normal. No respiratory distress. She has no wheezes.  GI: Soft. Bowel sounds are normal. She exhibits no distension. There is no tenderness.  Musculoskeletal: She exhibits no edema.  Able to move all extremities   Lymphadenopathy:    She has no cervical  adenopathy.  Neurological: She is alert.  Skin: Skin is warm and dry. She is not diaphoretic.  Psychiatric: She has a normal mood and affect.   ASSESSMENT/ PLAN:  1. Hypertension: she is stable will continue norvasc 10 mg daily; hctz 12.5 mg daily. She will not allow any blood work to be drawn  2. Osteoarthritis: she is stable will continue bengay to her feet twice daily and tylenol 650 mg every 6 hours as needed   3. Constipation: will continue senna s 2 tabs daily  4. Stage IV CKD: she will not allow for lab work to be done in order to monitor her status; will not make changes and will monitor her status   5. PUD: she is denying any complaints of GI distress; is presently not on medications. Will monitor   6. allergic rhinitis: her flonase was stopped; will monitor     Synthia Innocenteborah Green NP [facility]  Contact [phone] Monday through Friday 8am- 5pm  After hours call [phone]

## 2016-08-15 ENCOUNTER — Encounter: Payer: Self-pay | Admitting: Adult Health

## 2016-08-15 ENCOUNTER — Non-Acute Institutional Stay (SKILLED_NURSING_FACILITY): Payer: Medicare Other | Admitting: Adult Health

## 2016-08-15 DIAGNOSIS — K279 Peptic ulcer, site unspecified, unspecified as acute or chronic, without hemorrhage or perforation: Secondary | ICD-10-CM | POA: Diagnosis not present

## 2016-08-15 DIAGNOSIS — N184 Chronic kidney disease, stage 4 (severe): Secondary | ICD-10-CM

## 2016-08-15 DIAGNOSIS — I10 Essential (primary) hypertension: Secondary | ICD-10-CM

## 2016-08-15 DIAGNOSIS — M159 Polyosteoarthritis, unspecified: Secondary | ICD-10-CM

## 2016-08-15 DIAGNOSIS — M150 Primary generalized (osteo)arthritis: Secondary | ICD-10-CM | POA: Diagnosis not present

## 2016-08-15 NOTE — Progress Notes (Signed)
Patient ID: Chelsea [name], female   DOB: 11/25/1918, 82 y.o.   MRN: [id number]   Location:   [name] Room Number: 228-B Place of Service:  SNF (31)   CODE STATUS: DNR  No Known Allergies  Chief Complaint  Patient presents with  . Medical Management of Chronic Issues     HPI:  She is a long term resident of this facility being seen for the management of her chronic illnesses. Overall her status remains stable. She does get out of bed daily; she is out and about. She is not voicing any complaints. There are no nursing concerns at this time.    Past Medical History:  Diagnosis Date  . Cholelithiasis   . Chronic pain   . Constipation   . Diabetes mellitus without complication (HCC)   . Essential hypertension, benign 01/13/2013  . Hyperlipidemia   . Hypertension   . Osteoarthritis   . PUD (peptic ulcer disease)     Past Surgical History:  Procedure Laterality Date  . ABDOMINAL HYSTERECTOMY    . CYSTOPLASTY    . right hip surgery      Social History   Social History  . Marital status: Widowed    Spouse name: N/A  . Number of children: N/A  . Years of education: N/A   Occupational History  . Not on file.   Social History Main Topics  . Smoking status: Never Smoker  . Smokeless tobacco: Not on file  . Alcohol use No  . Drug use: No  . Sexual activity: Not on file   Other Topics Concern  . Not on file   Social History Narrative  . No narrative on file   History reviewed. No pertinent family history.    VITAL SIGNS BP (!) 161/74   Pulse 88   Temp 98.1 F (36.7 C) (Oral)   Resp 20   Ht 5\' 4"  (1.626 m)   Wt 176 lb (79.8 kg)   SpO2 99%   BMI 30.21 kg/m   Patient's Medications  New Prescriptions   No medications on file  Previous Medications   AMLODIPINE (NORVASC) 10 MG TABLET    Take 10 mg by mouth daily.   CARBOXYMETHYLCELLUL-GLYCERIN (OPTIVE) 0.5-0.9 % SOLN    Apply to eye. Instill 2 drops into both eyes three times a day for dry  eyes   HYDROCHLOROTHIAZIDE (MICROZIDE) 12.5 MG CAPSULE    Take 12.5 mg by mouth daily.   MENTHOL-METHYL SALICYLATE (BENGAY GREASELESS EX)    Apply 1 application topically 2 (two) times daily as needed. Apply to neck three times daily as needed   MULTIPLE VITAMIN (MULTIVITAMIN) TABLET    Take 1 tablet by mouth daily.   NYSTATIN (MYCOSTATIN) POWDER    Apply topically as needed (Apply under breasts prn).   SENNOSIDES-DOCUSATE SODIUM (SENOKOT-S) 8.6-50 MG TABLET    Take 2 tablets by mouth daily. For constipation  Modified Medications   No medications on file  Discontinued Medications   No medications on file     SIGNIFICANT DIAGNOSTIC EXAMS  LABS REVIEWED: DECLINES LABS  08-30-13: wbc 7.2; hgb 9.3; hct 28.3; mcv 90.5; plt 225; gucose 101; bun 54; creat 2.31; k+4.7; na++138; liver normal albumin 3.7  12-03-14: UA: neg      Review of Systems Constitutional: Negative for appetite change and fatigue.   Respiratory: Negative for cough, chest tightness and shortness of breath.   Cardiovascular: Negative for chest pain, palpitations and leg swelling.  Gastrointestinal: Negative for  nausea, abdominal pain, diarrhea and constipation.  Musculoskeletal: Negative for myalgias and arthralgias.  Skin: Negative for pallor.  Psychiatric/Behavioral: The patient is not nervous/anxious.     Physical Exam Constitutional: No distress.  Overweight   Eyes: Conjunctivae are normal.  Neck: Neck supple. No JVD present. No thyromegaly present.  Cardiovascular: Normal rate, regular rhythm and intact distal pulses.   Respiratory: Effort normal and breath sounds normal. No respiratory distress. She has no wheezes.  GI: Soft. Bowel sounds are normal. She exhibits no distension. There is no tenderness.  Musculoskeletal: She exhibits no edema.  Able to move all extremities   Lymphadenopathy:    She has no cervical adenopathy.  Neurological: She is alert.  Skin: Skin is warm and dry. She is not diaphoretic.    Psychiatric: She has a normal mood and affect.   ASSESSMENT/ PLAN:  1. Hypertension: she is stable will continue norvasc 10 mg daily; hctz 12.5 mg daily. She will not allow any blood work to be drawn  2. Osteoarthritis: she is stable will continue bengay to her feet twice daily and tylenol 650 mg every 6 hours as needed   3. Constipation: will continue senna s 2 tabs daily  4. Stage IV CKD: she will not allow for lab work to be done in order to monitor her status; will not make changes and will monitor her status   5. PUD: she is denying any complaints of GI distress; is presently not on medications. Will monitor   6. allergic rhinitis: her flonase was stopped; will monitor    Will check blood pressure  Twice daily for one week and report.   MD is aware of resident's narcotic use and is in agreement with current plan of care. We will attempt to wean resident as appropriate.      Synthia Innocenteborah Sudie Bandel NP [facility]  Contact [phone] Monday through Friday 8am- 5pm  After hours call [phone]

## 2016-09-18 ENCOUNTER — Non-Acute Institutional Stay (SKILLED_NURSING_FACILITY): Payer: Medicare Other | Admitting: Internal Medicine

## 2016-09-18 ENCOUNTER — Encounter: Payer: Self-pay | Admitting: Internal Medicine

## 2016-09-18 DIAGNOSIS — M150 Primary generalized (osteo)arthritis: Secondary | ICD-10-CM

## 2016-09-18 DIAGNOSIS — N184 Chronic kidney disease, stage 4 (severe): Secondary | ICD-10-CM

## 2016-09-18 DIAGNOSIS — K279 Peptic ulcer, site unspecified, unspecified as acute or chronic, without hemorrhage or perforation: Secondary | ICD-10-CM

## 2016-09-18 DIAGNOSIS — I10 Essential (primary) hypertension: Secondary | ICD-10-CM | POA: Diagnosis not present

## 2016-09-18 DIAGNOSIS — M159 Polyosteoarthritis, unspecified: Secondary | ICD-10-CM

## 2016-09-18 NOTE — Progress Notes (Signed)
Patient ID: Chelsea [name], female   DOB: 11/23/1918, 82 y.o.   MRN: [id number]   Location:  [name] Room Number: 228-B Place of Service:  SNF [phone]) Provider:  Edmon CrapeArlo Asante Ritacco, PA-C  Kirt Boysarter, Monica, DO  Patient Care Team: Kirt BoysMonica Carter, DO as PCP - General (Internal Medicine) Sharee Holstereborah S Green, NP as Nurse Practitioner (Nurse Practitioner) [facility]  Extended Emergency Contact Information Primary Emergency Contact: [name],[name] Address: [address]          [place], [place] Home Phone: [phone] Relation: Other  Goals of care: Advanced Directive information Advanced Directives 09/18/2016  Does Patient Have a Medical Advance Directive? Yes  Type of Advance Directive Out of facility DNR (pink MOST or yellow form)  Does patient want to make changes to medical advance directive? -  Copy of Healthcare Power of Attorney in Chart? -     Chief Complaint  Patient presents with  . Medical Management of Chronic Issues    Follow up  Chronic medical conditions including Hypertension osteoarthritis constipation stage IV chronic kidney disease peptic ulcer disease and allergic rhinitis.   HPI:  Pt is a 82 y.o. female seen today for medical management of chronic diseases.  As noted above - he appears to continue to be stable-she has no complaints today-she refuses blood work which makes it somewhat difficult to follow her chronic medical conditions but nonetheless she appears to be stable and doing relatively well.  She does have a history of hypertension she is on hydrochlorothiazide 12.5 mg a day as well as Norvasc 10 mg a day-I do not see frequent blood pressures I did get 150/70 manually-he wanted to see her 1 4150 range-will order blood pressure checks daily with a log for review next week so we can get some more consistent readings are 1 it's actually running.  She is not complaining of any headache dizziness or  syncopal-type feelings.  Regards osteoarthritis . Nursing staff says she does occasionally complain of somewhat diffuse pain-however they say she does not have an active Tylenol order will restart this  She also has stage IV chronic kidney disease again we have not had recent labs secondary to patient refusal.     Past Medical History:  Diagnosis Date  . Cholelithiasis   . Chronic pain   . Constipation   . Diabetes mellitus without complication (HCC)   . Essential hypertension, benign 01/13/2013  . Hyperlipidemia   . Hypertension   . Osteoarthritis   . PUD (peptic ulcer disease)    Past Surgical History:  Procedure Laterality Date  . ABDOMINAL HYSTERECTOMY    . CYSTOPLASTY    . right hip surgery      No Known Allergies    Medication List       Accurate as of 09/18/16 10:03 AM. Always use your most recent med list.          amLODipine 10 MG tablet Commonly known as:  NORVASC Take 10 mg by mouth daily.   BENGAY GREASELESS EX Apply 1 application topically 2 (two) times daily as needed. Apply to neck three times daily as needed   hydrochlorothiazide 12.5 MG capsule Commonly known as:  MICROZIDE Take 12.5 mg by mouth daily.   multivitamin tablet Take 1 tablet by mouth daily.   nystatin powder Commonly known as:  MYCOSTATIN/NYSTOP Apply topically as needed (Apply under breasts prn).   OPTIVE 0.5-0.9 % Soln Generic drug:  Carboxymethylcellul-Glycerin Apply to eye.  Instill 2 drops into both eyes three times a day for dry eyes   sennosides-docusate sodium 8.6-50 MG tablet Commonly known as:  SENOKOT-S Take 2 tablets by mouth daily. For constipation       Review of Systems  Constitutional: Negative for appetite change and fatigue.  Respiratory: Negative for cough, chest tightness and shortness of breath.   Cardiovascular: Negative for chest pain, palpitations and leg swelling.  Gastrointestinal: Negative for nausea, abdominal pain, diarrhea and  constipation.  Musculoskeletal: Complains of intermittent joint pain Skin: Negative for pallor.  Psychiatric/Behavioral: The patient is not nervous/anxious.      Immunization History  Administered Date(s) Administered  . Influenza Whole 07/20/2013  . Influenza-Unspecified 07/19/2014  . PPD Test 05/30/2011  . Pneumococcal-Unspecified 05/17/2011   Pertinent  Health Maintenance Due  Topic Date Due  . OPHTHALMOLOGY EXAM  10/08/2016 (Originally 11/11/2015)  . FOOT EXAM  10/24/2016  . INFLUENZA VACCINE  Completed  . DEXA SCAN  Addressed  . PNA vac Low Risk Adult  Completed   Fall Risk  11/10/2014  Falls in the past year? No   Functional Status Survey:    Vitals:   09/18/16 0959  BP: (!) 143/76  Pulse: 72  Resp: 18  Temp: 97.9 F (36.6 C)  TempSrc: Oral  SpO2: 96%  Weight: 177 lb (80.3 kg)  Height: 5\' 4"  (1.626 m)   Body mass index is 30.38 kg/m. Physical Exam  Constitutional: No distress. Pleasant disposition  Overweight   Eyes: Conjunctivae are normal. Visual acuity appears grossly intact she has prescription lenses Neck: Neck supple. No JVD present. No thyromegaly present.  Cardiovascular: Normal rate, regular rhythm and intact distal pulses. Does not have significant lower extremity edema   Respiratory: Effort normal and breath sounds normal. No respiratory distress. She has no wheezes.  GI: Soft. Bowel sounds are normal. She exhibits no distension. There is no tenderness.  Musculoskeletal: She exhibits no edema.  Able to move all extremities   ambulates in a wheelchair  Lymphadenopathy:    She has no cervical adenopathy.  Neurological: She is alert. No lateralizing findings  Skin: Skin is warm and dry. She is not diaphoretic.  Psychiatric: She has a normal mood and affect. Pleasant and appropriate    Labs reviewed: No results for input(s): NA, K, CL, CO2, GLUCOSE, BUN, CREATININE, CALCIUM, MG, PHOS in the last 8760 hours. No results for input(s): AST, ALT,  ALKPHOS, BILITOT, PROT, ALBUMIN in the last 8760 hours. No results for input(s): WBC, NEUTROABS, HGB, HCT, MCV, PLT in the last 8760 hours. Lab Results  Component Value Date   TSH 2.072 05/18/2011   Lab Results  Component Value Date   HGBA1C 7.0 (H) 05/18/2011   Lab Results  Component Value Date   CHOL 289 (H) 05/18/2011   HDL 40 05/18/2011   LDLCALC 187 (H) 05/18/2011   TRIG 310 (H) 05/18/2011   CHOLHDL 7.2 05/18/2011    Significant Diagnostic Results in last 30 days:  No results found.  Assessment/Plan   1. Hypertension:  will continue norvasc 10 mg daily; hctz 12.5 mg daily. She will not allow any blood work to be drawn I note that systolics are listed are largely in the 140s will order blood pressure checks daily with a log for review next week to see if there is consistency here  2. Osteoarthritis: she is stable will continue bengay to her feet twice daily and start tylenol 650 mg every 8 hours as needed   3.  Constipation: will continue senna s 2 tabs daily  4. Stage IV CKD: she will not allow for lab work to be done in order to monitor her status; will not make changes and will monitor her status clinically she appears to be relatively stable  5. PUD: she is denying any complaints of GI distress; is presently not on medications. Will monitor   6. allergic rhinitis: her flonase was stopped; will monitor it does not appear she is symptomatic   [id number]

## 2016-11-13 ENCOUNTER — Non-Acute Institutional Stay (SKILLED_NURSING_FACILITY): Payer: Medicare Other | Admitting: Adult Health

## 2016-11-13 DIAGNOSIS — D649 Anemia, unspecified: Secondary | ICD-10-CM

## 2016-11-13 DIAGNOSIS — N184 Chronic kidney disease, stage 4 (severe): Secondary | ICD-10-CM | POA: Diagnosis not present

## 2016-11-13 DIAGNOSIS — M159 Polyosteoarthritis, unspecified: Secondary | ICD-10-CM

## 2016-11-13 DIAGNOSIS — M150 Primary generalized (osteo)arthritis: Secondary | ICD-10-CM | POA: Diagnosis not present

## 2016-11-13 DIAGNOSIS — K279 Peptic ulcer, site unspecified, unspecified as acute or chronic, without hemorrhage or perforation: Secondary | ICD-10-CM

## 2016-11-13 DIAGNOSIS — I10 Essential (primary) hypertension: Secondary | ICD-10-CM | POA: Diagnosis not present

## 2016-12-12 ENCOUNTER — Non-Acute Institutional Stay (SKILLED_NURSING_FACILITY): Payer: Medicare Other | Admitting: Adult Health

## 2016-12-12 ENCOUNTER — Encounter: Payer: Self-pay | Admitting: Adult Health

## 2016-12-12 DIAGNOSIS — N184 Chronic kidney disease, stage 4 (severe): Secondary | ICD-10-CM

## 2016-12-12 DIAGNOSIS — I10 Essential (primary) hypertension: Secondary | ICD-10-CM

## 2016-12-12 DIAGNOSIS — M150 Primary generalized (osteo)arthritis: Secondary | ICD-10-CM

## 2016-12-12 DIAGNOSIS — M159 Polyosteoarthritis, unspecified: Secondary | ICD-10-CM

## 2016-12-12 DIAGNOSIS — K5901 Slow transit constipation: Secondary | ICD-10-CM

## 2016-12-12 DIAGNOSIS — J302 Other seasonal allergic rhinitis: Secondary | ICD-10-CM

## 2016-12-12 DIAGNOSIS — K279 Peptic ulcer, site unspecified, unspecified as acute or chronic, without hemorrhage or perforation: Secondary | ICD-10-CM

## 2016-12-12 NOTE — Progress Notes (Signed)
Location:   [name] Room Number: 228 B Place of Service:  SNF (31)   CODE STATUS: DNR  No Known Allergies  Chief Complaint  Patient presents with  . Medical Management of Chronic Issues    Routine Visit    HPI:  She is a long term resident of this facility being seen for the management of her chronic illnesses. Overall her status is stable. She tells me that she is feeling good. She will not allow for blood draws. There are no nursing concerns at this time.   Past Medical History:  Diagnosis Date  . Cholelithiasis   . Chronic pain   . Constipation   . Diabetes mellitus without complication (HCC)   . Essential hypertension, benign 01/13/2013  . Hyperlipidemia   . Hypertension   . Osteoarthritis   . PUD (peptic ulcer disease)     Past Surgical History:  Procedure Laterality Date  . ABDOMINAL HYSTERECTOMY    . CYSTOPLASTY    . right hip surgery      Social History   Social History  . Marital status: Widowed    Spouse name: N/A  . Number of children: N/A  . Years of education: N/A   Occupational History  . Not on file.   Social History Main Topics  . Smoking status: Never Smoker  . Smokeless tobacco: Never Used  . Alcohol use No  . Drug use: No  . Sexual activity: Not on file   Other Topics Concern  . Not on file   Social History Narrative  . No narrative on file   History reviewed. No pertinent family history.    VITAL SIGNS BP (!) 142/79   Pulse 68   Temp 98 F (36.7 C)   Ht 5\' 4"  (1.626 m)   Wt 177 lb (80.3 kg)   BMI 30.38 kg/m   Patient's Medications  New Prescriptions   No medications on file  Previous Medications   ACETAMINOPHEN (TYLENOL) 325 MG TABLET    Take 650 mg by mouth every 6 (six) hours as needed.   AMLODIPINE (NORVASC) 10 MG TABLET    Take 10 mg by mouth daily.   CARBOXYMETHYLCELLUL-GLYCERIN (OPTIVE) 0.5-0.9 % SOLN    Apply to eye. Instill 2 drops into both eyes three times a day for dry eyes   HYDROCHLOROTHIAZIDE (MICROZIDE) 12.5 MG CAPSULE    Take 12.5 mg by mouth daily.   HYDROCORTISONE CREAM 1 %    Apply to hands and fingers topically three times daily   MENTHOL-METHYL SALICYLATE (BENGAY GREASELESS EX)    Apply 1 application topically 2 (two) times daily as needed. Apply to neck three times daily as needed   MULTIPLE VITAMIN (MULTIVITAMIN) TABLET    Take 1 tablet by mouth daily.   NYSTATIN EX    Apply topically. Apply Nystatin cream 100000 unit/gm to bilateral breast topically two times daily   SENNOSIDES-DOCUSATE SODIUM (SENOKOT-S) 8.6-50 MG TABLET    Take 2 tablets by mouth daily. For constipation  Modified Medications   No medications on file  Discontinued Medications   NYSTATIN (MYCOSTATIN) POWDER    Apply topically as needed (Apply under breasts prn).     SIGNIFICANT DIAGNOSTIC EXAMS  LABS REVIEWED: DECLINES LABS  08-30-13: wbc 7.2; hgb 9.3; hct 28.3; mcv 90.5; plt 225; gucose 101; bun 54; creat 2.31; k+4.7; na++138; liver normal albumin 3.7  12-03-14: UA: neg      Review of Systems Constitutional: Negative for appetite change and fatigue.  Respiratory: Negative for cough, chest tightness and shortness of breath.   Cardiovascular: Negative for chest pain, palpitations and leg swelling.  Gastrointestinal: Negative for nausea, abdominal pain, diarrhea and constipation.  Musculoskeletal: Negative for myalgias and arthralgias.  Skin: Negative for pallor.  Psychiatric/Behavioral: The patient is not nervous/anxious.     Physical Exam Constitutional: No distress.  Overweight   Eyes: Conjunctivae are normal.  Neck: Neck supple. No JVD present. No thyromegaly present.  Cardiovascular: Normal rate, regular rhythm and intact distal pulses.   Respiratory: Effort normal and breath sounds normal. No respiratory distress. She has no wheezes.  GI: Soft. Bowel sounds are normal. She exhibits no distension. There is no tenderness.  Musculoskeletal: She exhibits no edema.    Able to move all extremities   Lymphadenopathy:    She has no cervical adenopathy.  Neurological: She is alert.  Skin: Skin is warm and dry. She is not diaphoretic.  Psychiatric: She has a normal mood and affect.   ASSESSMENT/ PLAN:  1. Hypertension: she is stable will continue norvasc 10 mg daily; hctz 12.5 mg daily. She will not allow any blood work to be drawn  2. Osteoarthritis: she is stable will continue bengay to her feet twice daily and tylenol 650 mg every 6 hours as needed   3. Constipation: will continue senna s 2 tabs daily  4. Stage IV CKD: she will not allow for lab work to be done in order to monitor her status; will not make changes and will monitor her status   5. PUD: she is denying any complaints of GI distress; is presently not on medications. Will monitor   6. allergic rhinitis: her flonase was stopped; will monitor     Synthia Innocent NP [facility]  Contact [phone] Monday through Friday 8am- 5pm  After hours call [phone]

## 2016-12-17 NOTE — Progress Notes (Signed)
[facility]   Place of Service:  SNF (31)   CODE STATUS: dnr  No Known Allergies  Chief Complaint  Patient presents with  . Medical Management of Chronic Issues    HPI:  She is a long term resident of this facility being seen for the management of her chronic illnesses. Overall her status is stable. She does get out of bed daily. She will not allow for blood draws. There are no nursing concerns at this time.    Past Medical History:  Diagnosis Date  . Cholelithiasis   . Chronic pain   . Constipation   . Diabetes mellitus without complication (HCC)   . Essential hypertension, benign 01/13/2013  . Hyperlipidemia   . Hypertension   . Osteoarthritis   . PUD (peptic ulcer disease)     Past Surgical History:  Procedure Laterality Date  . ABDOMINAL HYSTERECTOMY    . CYSTOPLASTY    . right hip surgery      Social History   Social History  . Marital status: Widowed    Spouse name: N/A  . Number of children: N/A  . Years of education: N/A   Occupational History  . Not on file.   Social History Main Topics  . Smoking status: Never Smoker  . Smokeless tobacco: Never Used  . Alcohol use No  . Drug use: No  . Sexual activity: Not on file   Other Topics Concern  . Not on file   Social History Narrative  . No narrative on file   No family history on file.    VITAL SIGNS BP (!) 157/72   Pulse 80   Temp 97.8 F (36.6 C)   Resp 18   Ht 5\' 4"  (1.626 m)   Wt 173 lb (78.5 kg)   SpO2 99%   BMI 29.70 kg/m   Patient's Medications  New Prescriptions   No medications on file  Previous Medications   ACETAMINOPHEN (TYLENOL) 325 MG TABLET    Take 650 mg by mouth every 6 (six) hours as needed.   AMLODIPINE (NORVASC) 10 MG TABLET    Take 10 mg by mouth daily.   CARBOXYMETHYLCELLUL-GLYCERIN (OPTIVE) 0.5-0.9 % SOLN    Apply to eye. Instill 2 drops into both eyes three times a day for dry eyes   HYDROCHLOROTHIAZIDE (MICROZIDE) 12.5 MG CAPSULE    Take  12.5 mg by mouth daily.   HYDROCORTISONE CREAM 1 %    Apply to hands and fingers topically three times daily   MENTHOL-METHYL SALICYLATE (BENGAY GREASELESS EX)    Apply 1 application topically 2 (two) times daily as needed. Apply to neck three times daily as needed   MULTIPLE VITAMIN (MULTIVITAMIN) TABLET    Take 1 tablet by mouth daily.   NYSTATIN EX    Apply topically. Apply Nystatin cream 100000 unit/gm to bilateral breast topically two times daily   SENNOSIDES-DOCUSATE SODIUM (SENOKOT-S) 8.6-50 MG TABLET    Take 2 tablets by mouth daily. For constipation  Modified Medications   No medications on file  Discontinued Medications   No medications on file     SIGNIFICANT DIAGNOSTIC EXAMS    LABS REVIEWED: DECLINES LABS  08-30-13: wbc 7.2; hgb 9.3; hct 28.3; mcv 90.5; plt 225; gucose 101; bun 54; creat 2.31; k+4.7; na++138; liver normal albumin 3.7  12-03-14: UA: neg      Review of Systems Constitutional: Negative for appetite change and fatigue.   Respiratory: Negative for cough, chest tightness and  shortness of breath.   Cardiovascular: Negative for chest pain, palpitations and leg swelling.  Gastrointestinal: Negative for nausea, abdominal pain, diarrhea and constipation.  Musculoskeletal: Negative for myalgias and arthralgias.  Skin: Negative for pallor.  Psychiatric/Behavioral: The patient is not nervous/anxious.     Physical Exam Constitutional: No distress.  Overweight   Eyes: Conjunctivae are normal.  Neck: Neck supple. No JVD present. No thyromegaly present.  Cardiovascular: Normal rate, regular rhythm and intact distal pulses.   Respiratory: Effort normal and breath sounds normal. No respiratory distress. She has no wheezes.  GI: Soft. Bowel sounds are normal. She exhibits no distension. There is no tenderness.  Musculoskeletal: She exhibits no edema.  Able to move all extremities   Lymphadenopathy:    She has no cervical adenopathy.  Neurological: She is alert.    Skin: Skin is warm and dry. She is not diaphoretic.  Psychiatric: She has a normal mood and affect.   ASSESSMENT/ PLAN:  1. Hypertension: she is stable will continue norvasc 10 mg daily; hctz 12.5 mg daily. She will not allow any blood work to be drawn  2. Osteoarthritis: she is stable will continue bengay to her feet twice daily and tylenol 650 mg every 6 hours as needed   3. Constipation: will continue senna s 2 tabs daily  4. Stage IV CKD: she will not allow for lab work to be done in order to monitor her status; will not make changes and will monitor her status   5. PUD: she is denying any complaints of GI distress; is presently not on medications. Will monitor   6. allergic rhinitis: her flonase was stopped; will monitor        Synthia Innocenteborah Ina Poupard NP [facility]  Contact [phone] Monday through Friday 8am- 5pm  After hours call [phone]

## 2017-01-09 ENCOUNTER — Encounter: Payer: Self-pay | Admitting: Adult Health

## 2017-01-09 ENCOUNTER — Non-Acute Institutional Stay (SKILLED_NURSING_FACILITY): Payer: Medicare Other | Admitting: Adult Health

## 2017-01-09 DIAGNOSIS — N184 Chronic kidney disease, stage 4 (severe): Secondary | ICD-10-CM | POA: Diagnosis not present

## 2017-01-09 DIAGNOSIS — M150 Primary generalized (osteo)arthritis: Secondary | ICD-10-CM

## 2017-01-09 DIAGNOSIS — I10 Essential (primary) hypertension: Secondary | ICD-10-CM

## 2017-01-09 DIAGNOSIS — K279 Peptic ulcer, site unspecified, unspecified as acute or chronic, without hemorrhage or perforation: Secondary | ICD-10-CM

## 2017-01-09 DIAGNOSIS — J302 Other seasonal allergic rhinitis: Secondary | ICD-10-CM

## 2017-01-09 DIAGNOSIS — K5901 Slow transit constipation: Secondary | ICD-10-CM

## 2017-01-09 DIAGNOSIS — M159 Polyosteoarthritis, unspecified: Secondary | ICD-10-CM

## 2017-01-09 NOTE — Progress Notes (Signed)
Location:   [name] Room Number: 228 B Place of Service:  SNF (31)   CODE STATUS: DNR  No Known Allergies  Chief Complaint  Patient presents with  . Medical Management of Chronic Issues    1 month follow up    HPI:  She is a long term resident of this facility being seen for the management of her chronic illnesses. Overall her status is stable. She tells me that she is feeling good. There are no nursing concerns at this time.    Past Medical History:  Diagnosis Date  . Cholelithiasis   . Chronic pain   . Constipation   . Diabetes mellitus without complication (HCC)   . Essential hypertension, benign 01/13/2013  . Hyperlipidemia   . Hypertension   . Osteoarthritis   . PUD (peptic ulcer disease)     Past Surgical History:  Procedure Laterality Date  . ABDOMINAL HYSTERECTOMY    . CYSTOPLASTY    . right hip surgery      Social History   Social History  . Marital status: Widowed    Spouse name: N/A  . Number of children: N/A  . Years of education: N/A   Occupational History  . Not on file.   Social History Main Topics  . Smoking status: Never Smoker  . Smokeless tobacco: Never Used  . Alcohol use No  . Drug use: No  . Sexual activity: Not on file   Other Topics Concern  . Not on file   Social History Narrative  . No narrative on file   History reviewed. No pertinent family history.    VITAL SIGNS BP (!) 122/56   Pulse 70   Temp (!) 96.8 F (36 C)   Resp 18   Ht  (1.626 m)   Wt 173 lb (78.5 kg)   SpO2 98%   BMI 29.70 kg/m   Patient's Medications  New Prescriptions   No medications on file  Previous Medications   ACETAMINOPHEN (TYLENOL) 325 MG TABLET    Take 650 mg by mouth every 6 (six) hours as needed.   AMLODIPINE (NORVASC) 10 MG TABLET    Take 10 mg by mouth daily.   CARBOXYMETHYLCELLUL-GLYCERIN (OPTIVE) 0.5-0.9 % SOLN    Apply to eye. Instill 2 drops into both eyes three times a day for dry eyes   HYDROCHLOROTHIAZIDE (MICROZIDE) 12.5 MG CAPSULE    Take 12.5 mg by mouth daily.   HYDROCORTISONE CREAM 1 %    Apply to hands and fingers topically three times daily   MENTHOL-METHYL SALICYLATE (BENGAY GREASELESS EX)    Apply 1 application topically 2 (two) times daily as needed. Apply to neck three times daily as needed   MULTIPLE VITAMIN (MULTIVITAMIN) TABLET    Take 1 tablet by mouth daily.   NYSTATIN EX    Apply topically. Apply Nystatin cream 100000 unit/gm to bilateral breast topically two times daily   SENNOSIDES-DOCUSATE SODIUM (SENOKOT-S) 8.6-50 MG TABLET    Take 2 tablets by mouth daily. For constipation  Modified Medications   No medications on file  Discontinued Medications   No medications on file     SIGNIFICANT DIAGNOSTIC EXAMS  LABS REVIEWED: DECLINES LABS  08-30-13: wbc 7.2; hgb 9.3; hct 28.3; mcv 90.5; plt 225; gucose 101; bun 54; creat 2.31; k+4.7; na++138; liver normal albumin 3.7  12-03-14: UA: neg      Review of Systems Constitutional: Negative for appetite change and fatigue.   Respiratory: Negative for cough,  chest tightness and shortness of breath.   Cardiovascular: Negative for chest pain, palpitations and leg swelling.  Gastrointestinal: Negative for nausea, abdominal pain, diarrhea and constipation.  Musculoskeletal: Negative for myalgias and arthralgias.  Skin: Negative for pallor.  Psychiatric/Behavioral: The patient is not nervous/anxious.     Physical Exam Constitutional: No distress.  Overweight   Eyes: Conjunctivae are normal.  Neck: Neck supple. No JVD present. No thyromegaly present.  Cardiovascular: Normal rate, regular rhythm and intact distal pulses.   Respiratory: Effort normal and breath sounds normal. No respiratory distress. She has no wheezes.  GI: Soft. Bowel sounds are normal. She exhibits no distension. There is no tenderness.  Musculoskeletal: She exhibits no edema.  Able to move all extremities   Lymphadenopathy:    She has no  cervical adenopathy.  Neurological: She is alert.  Skin: Skin is warm and dry. She is not diaphoretic.  Psychiatric: She has a normal mood and affect.   ASSESSMENT/ PLAN:  1. Hypertension: b/p 122/56 she is stable will continue norvasc 10 mg daily; hctz 12.5 mg daily. She will not allow any blood work to be drawn  2. Osteoarthritis: she is stable will continue bengay to her feet twice daily and tylenol 650 mg every 6 hours as needed   3. Constipation: will continue senna s 2 tabs daily  4. Stage IV CKD: she will not allow for lab work to be done in order to monitor her status; will not make changes and will monitor her status   5. PUD: she is denying any complaints of GI distress; is presently not on medications. Will monitor   6. allergic rhinitis: her flonase was stopped; will monitor    MD is aware of resident's narcotic use and is in agreement with current plan of care. We will attempt to wean resident as apropriate     Synthia Innocent NP [facility]  Contact [phone] Monday through Friday 8am- 5pm  After hours call [phone]

## 2017-01-11 ENCOUNTER — Encounter (HOSPITAL_COMMUNITY): Payer: Self-pay

## 2017-01-11 ENCOUNTER — Emergency Department (HOSPITAL_COMMUNITY)
Admission: EM | Admit: 2017-01-11 | Discharge: 2017-01-12 | Disposition: A | Discharge status: Home / Self Care | Payer: Medicare Other | Attending: Emergency Medicine | Admitting: Emergency Medicine

## 2017-01-11 DIAGNOSIS — I129 Hypertensive chronic kidney disease with stage 1 through stage 4 chronic kidney disease, or unspecified chronic kidney disease: Secondary | ICD-10-CM | POA: Insufficient documentation

## 2017-01-11 DIAGNOSIS — E1122 Type 2 diabetes mellitus with diabetic chronic kidney disease: Secondary | ICD-10-CM | POA: Insufficient documentation

## 2017-01-11 DIAGNOSIS — N184 Chronic kidney disease, stage 4 (severe): Secondary | ICD-10-CM

## 2017-01-11 DIAGNOSIS — L309 Dermatitis, unspecified: Secondary | ICD-10-CM | POA: Insufficient documentation

## 2017-01-11 DIAGNOSIS — R21 Rash and other nonspecific skin eruption: Secondary | ICD-10-CM | POA: Diagnosis present

## 2017-01-11 LAB — URINALYSIS, ROUTINE W REFLEX MICROSCOPIC
BILIRUBIN URINE: NEGATIVE
Glucose, UA: NEGATIVE mg/dL
KETONES UR: NEGATIVE mg/dL
NITRITE: NEGATIVE
PROTEIN: 100 mg/dL — AB
SPECIFIC GRAVITY, URINE: 1.011 (ref 1.005–1.030)
pH: 6 (ref 5.0–8.0)

## 2017-01-11 LAB — CBC
HCT: 28.5 % — ABNORMAL LOW (ref 36.0–46.0)
Hemoglobin: 9.4 g/dL — ABNORMAL LOW (ref 12.0–15.0)
MCH: 28.7 pg (ref 26.0–34.0)
MCHC: 33 g/dL (ref 30.0–36.0)
MCV: 86.9 fL (ref 78.0–100.0)
PLATELETS: 243 10*3/uL (ref 150–400)
RBC: 3.28 MIL/uL — ABNORMAL LOW (ref 3.87–5.11)
RDW: 13.9 % (ref 11.5–15.5)
WBC: 7.9 10*3/uL (ref 4.0–10.5)

## 2017-01-11 LAB — BASIC METABOLIC PANEL
Anion gap: 8 (ref 5–15)
BUN: 43 mg/dL — AB (ref 6–20)
CALCIUM: 9.4 mg/dL (ref 8.9–10.3)
CHLORIDE: 108 mmol/L (ref 101–111)
CO2: 24 mmol/L (ref 22–32)
CREATININE: 2.12 mg/dL — AB (ref 0.44–1.00)
GFR calc Af Amer: 22 mL/min — ABNORMAL LOW (ref >60)
GFR calc non Af Amer: 19 mL/min — ABNORMAL LOW (ref >60)
GLUCOSE: 152 mg/dL — AB (ref 65–99)
Potassium: 4 mmol/L (ref 3.5–5.1)
Sodium: 140 mmol/L (ref 135–145)

## 2017-01-11 LAB — POC OCCULT BLOOD, ED: Fecal Occult Bld: NEGATIVE

## 2017-01-11 MED ORDER — SODIUM CHLORIDE 0.9 % IV BOLUS (SEPSIS): 1000.0000 mL | Freq: Once | INTRAVENOUS | Status: DC

## 2017-01-11 MED ORDER — CEPHALEXIN 500 MG PO CAPS
500.0000 mg | ORAL_CAPSULE | Freq: Once | ORAL | Status: AC
  Administered 2017-01-11: 500 mg via ORAL
  Filled 2017-01-11: qty 1

## 2017-01-11 MED ORDER — CLOTRIMAZOLE-BETAMETHASONE 1-0.05 % EX CREA: TOPICAL_CREAM | CUTANEOUS | 0 refills | Status: DC

## 2017-01-11 MED ORDER — CEPHALEXIN 500 MG PO CAPS: 500.0000 mg | ORAL_CAPSULE | Freq: Two times a day (BID) | ORAL | 0 refills | Status: DC

## 2017-01-11 NOTE — Discharge Instructions (Signed)
It was our pleasure to provide your ER care today - we hope that you feel better.  Apply clotrimazole cream 2x/day as need.   Give keflex as prescribed.   Follow up with primary care doctor in the coming week.  Return to ER if worse, new symptoms, other concern.

## 2017-01-11 NOTE — ED Triage Notes (Signed)
Per ems patient is from nursing facility.  According to staff, she has been confused and combative today, which is not her norm.  Pt does not have a history of dementia or alzheimer's.  Facility called ems for possible uti evaluation.  Pt has no complaints at this time.  Oriented x 2.  Rash on hands.

## 2017-01-11 NOTE — ED Notes (Signed)
RN attempted in and out cath. Pt refused.

## 2017-01-11 NOTE — ED Notes (Signed)
Attempted to collect urine but patient missed the urinal hat. Will try again when patient has to go.

## 2017-01-11 NOTE — ED Notes (Signed)
Pt refused to have an in and out done Lenda Kelp made aware.

## 2017-01-11 NOTE — ED Notes (Signed)
Pt refused IV and fluids.  

## 2017-01-11 NOTE — ED Provider Notes (Signed)
[facility]-EMERGENCY DEPT Provider Note   CSN: [id number] Arrival date & time: 01/11/17  1843     History   Chief Complaint Chief Complaint  Patient presents with  . Altered Mental Status    HPI Chelsea [name] is a 83 y.o. female.  Patient presents from ecf, was noted to be confused. In ED patient is awake alert, she states she was fussing about an itchy rash between fingers of both hands, and she doesn't know why they sent her to ED. Pt denies feeling sick or ill. Pt denies recent new med use. Denies fever or chills. No headache. No chest pain. No sob. Denies cough or uri c/o. No abd pain. No nvd. No dysuria or gu c/o.    The history is provided by the patient.  Altered Mental Status   Pertinent negatives include no confusion.    Past Medical History:  Diagnosis Date  . Cholelithiasis   . Chronic pain   . Constipation   . Diabetes mellitus without complication (HCC)   . Essential hypertension, benign 01/13/2013  . Hyperlipidemia   . Hypertension   . Osteoarthritis   . PUD (peptic ulcer disease)     Patient Active Problem List   Diagnosis Date Noted  . Seasonal allergies 09/07/2015  . Chronic renal disease, stage 4, severely decreased glomerular filtration rate between 15-29 mL/min/1.73 square meter (HCC) 11/19/2013  . Anemia 11/19/2013  . Hyperlipidemia 01/13/2013  . Essential hypertension, benign 01/13/2013  . PUD (peptic ulcer disease) 01/13/2013  . Constipation 01/13/2013  . Osteoarthritis 01/13/2013    Past Surgical History:  Procedure Laterality Date  . ABDOMINAL HYSTERECTOMY    . CYSTOPLASTY    . right hip surgery      OB History    No data available       Home Medications    Prior to Admission medications   Medication Sig Start Date End Date Taking? Authorizing Provider  acetaminophen (TYLENOL) 325 MG tablet Take 650 mg by mouth every 6 (six) hours as needed.    Historical Provider, MD  amLODipine (NORVASC) 10 MG tablet Take 10 mg by mouth daily.     Historical Provider, MD  Carboxymethylcellul-Glycerin (OPTIVE) 0.5-0.9 % SOLN Apply to eye. Instill 2 drops into both eyes three times a day for dry eyes    Historical Provider, MD  hydrochlorothiazide (MICROZIDE) 12.5 MG capsule Take 12.5 mg by mouth daily.    Historical Provider, MD  hydrocortisone cream 1 % Apply to hands and fingers topically three times daily    Historical Provider, MD  Menthol-Methyl Salicylate (BENGAY GREASELESS EX) Apply 1 application topically 2 (two) times daily as needed. Apply to neck three times daily as needed    Historical Provider, MD  Multiple Vitamin (MULTIVITAMIN) tablet Take 1 tablet by mouth daily.    Historical Provider, MD  NYSTATIN EX Apply topically. Apply Nystatin cream 100000 unit/gm to bilateral breast topically two times daily    Historical Provider, MD  sennosides-docusate sodium (SENOKOT-S) 8.6-50 MG tablet Take 2 tablets by mouth daily. For constipation    Historical Provider, MD    Family History History reviewed. No pertinent family history.  Social History Social History  Substance Use Topics  . Smoking status: Never Smoker  . Smokeless tobacco: Never Used  . Alcohol use No     Allergies   Patient has no known allergies.   Review of Systems Review of Systems  Constitutional: Negative for chills and fever.  HENT: Negative for sore throat.  Eyes: Negative for redness.  Respiratory: Negative for cough and shortness of breath.   Cardiovascular: Negative for chest pain.  Gastrointestinal: Negative for abdominal pain, diarrhea and vomiting.  Genitourinary: Negative for dysuria and flank pain.  Musculoskeletal: Negative for back pain and neck pain.  Skin: Positive for rash.  Neurological: Negative for headaches.  Hematological: Does not bruise/bleed easily.  Psychiatric/Behavioral: Negative for confusion.     Physical Exam Updated Vital Signs There were no vitals taken for this visit.  Physical Exam  Constitutional: She  appears well-developed and well-nourished. No distress.  HENT:  Mouth/Throat: Oropharynx is clear and moist.  Eyes: Conjunctivae are normal. No scleral icterus.  Neck: Neck supple. No tracheal deviation present.  Cardiovascular: Normal rate, regular rhythm, normal heart sounds and intact distal pulses.   Pulmonary/Chest: Effort normal and breath sounds normal. No respiratory distress.  Abdominal: Soft. Normal appearance and bowel sounds are normal. She exhibits no distension. There is no tenderness.  Genitourinary:  Genitourinary Comments: No cva tenderness  Musculoskeletal: She exhibits no edema.  Neurological: She is alert.  Oriented. Speech clear. Motor intact bil, stre 5/5. sens grossly intact.   Skin: Skin is warm and dry. No rash noted. She is not diaphoretic.  Scaly, erythematous dermatitis to skin in web spaces of both hands that appears most c/w fungal dermatitis w possibly mild superimposed cellulitis.   Psychiatric: She has a normal mood and affect.  Nursing note and vitals reviewed.    ED Treatments / Results  Labs (all labs ordered are listed, but only abnormal results are displayed) Results for orders placed or performed during the hospital encounter of 01/11/17  Basic metabolic panel  Result Value Ref Range   Sodium 140 135 - 145 mmol/L   Potassium 4.0 3.5 - 5.1 mmol/L   Chloride 108 101 - 111 mmol/L   CO2 24 22 - 32 mmol/L   Glucose, Bld 152 (H) 65 - 99 mg/dL   BUN 43 (H) 6 - 20 mg/dL   Creatinine, Ser 1.61 (H) 0.44 - 1.00 mg/dL   Calcium 9.4 8.9 - 09.6 mg/dL   GFR calc non Af Amer 19 (L) >60 mL/min   GFR calc Af Amer 22 (L) >60 mL/min   Anion gap 8 5 - 15  CBC  Result Value Ref Range   WBC 7.9 4.0 - 10.5 K/uL   RBC 3.28 (L) 3.87 - 5.11 MIL/uL   Hemoglobin 9.4 (L) 12.0 - 15.0 g/dL   HCT 04.5 (L) 40.9 - 81.1 %   MCV 86.9 78.0 - 100.0 fL   MCH 28.7 26.0 - 34.0 pg   MCHC 33.0 30.0 - 36.0 g/dL   RDW 91.4 78.2 - 95.6 %   Platelets 243 150 - 400 K/uL  POC  occult blood, ED Provider will collect  Result Value Ref Range   Fecal Occult Bld NEGATIVE NEGATIVE    EKG  EKG Interpretation None       Radiology No results found.  Procedures Procedures (including critical care time)  Medications Ordered in ED Medications - No data to display   Initial Impression / Assessment and Plan / ED Course  I have reviewed the triage vital signs and the nursing notes.  Pertinent labs & imaging results that were available during my care of the patient were reviewed by me and considered in my medical decision making (see chart for details).  Labs sent.  Reviewed nursing notes and prior charts for additional history.   rx clotrimazole cream.  Pt refuses cath/ua, refuses iv.   From ecf notes, appears pt has refused labs/evals in past.   Will rx.    Although no recent prior labs re renal insuff, nursing home notes reference chronic kidney disease, so suspect renal fxn c/w baseline.     Final Clinical Impressions(s) / ED Diagnoses   Final diagnoses:  None    New Prescriptions New Prescriptions   No medications on file     Cathren Laine, MD 01/11/17 2215

## 2017-01-11 NOTE — ED Notes (Signed)
Bed: [facility] Expected date:  Expected time:  Means of arrival:  Comments: EMS  

## 2017-01-14 ENCOUNTER — Telehealth: Payer: Self-pay

## 2017-01-14 NOTE — Telephone Encounter (Signed)
This is a patient of [facility], who was admitted to [facility] after hospitalization. [facility] - Hospital F/U is needed. Hospital discharge from [facility] on 01/12/2017.

## 2017-01-15 ENCOUNTER — Non-Acute Institutional Stay (SKILLED_NURSING_FACILITY): Payer: Medicare Other | Admitting: Adult Health

## 2017-01-15 ENCOUNTER — Encounter: Payer: Self-pay | Admitting: Adult Health

## 2017-01-15 DIAGNOSIS — I10 Essential (primary) hypertension: Secondary | ICD-10-CM | POA: Diagnosis not present

## 2017-01-15 DIAGNOSIS — B369 Superficial mycosis, unspecified: Secondary | ICD-10-CM | POA: Diagnosis not present

## 2017-01-15 NOTE — Progress Notes (Signed)
Location:   [name] Room Number: 228B Place of Service:  SNF (31)   CODE STATUS: DNR  No Known Allergies  Chief Complaint  Patient presents with  . Acute Visit    follow up ED     HPI:  She was sent to the ED on 01-11-17 and was found to have bilateral fungal dermatitis with superimposed cellulitis.  She tells me that she is feeling good and has no complaints.   Past Medical History:  Diagnosis Date  . Cholelithiasis   . Chronic pain   . Constipation   . Diabetes mellitus without complication (HCC)   . Essential hypertension, benign 01/13/2013  . Hyperlipidemia   . Hypertension   . Osteoarthritis   . PUD (peptic ulcer disease)     Past Surgical History:  Procedure Laterality Date  . ABDOMINAL HYSTERECTOMY    . CYSTOPLASTY    . right hip surgery      Social History   Social History  . Marital status: Widowed    Spouse name: N/A  . Number of children: N/A  . Years of education: N/A   Occupational History  . Not on file.   Social History Main Topics  . Smoking status: Never Smoker  . Smokeless tobacco: Never Used  . Alcohol use No  . Drug use: No  . Sexual activity: Not on file   Other Topics Concern  . Not on file   Social History Narrative  . No narrative on file   History reviewed. No pertinent family history.    VITAL SIGNS BP 132/70   Pulse 70   Temp (!) 96.8 F (36 C)   Resp 18   Ht  (1.626 m)   Wt 173 lb (78.5 kg)   SpO2 98%   BMI 29.70 kg/m   Patient's Medications  New Prescriptions   No medications on file  Previous Medications   ACETAMINOPHEN (TYLENOL) 325 MG TABLET    Take 650 mg by mouth every 6 (six) hours as needed.   AMLODIPINE (NORVASC) 10 MG TABLET    Take 10 mg by mouth daily.   CARBOXYMETHYLCELLUL-GLYCERIN (REFRESH OPTIVE OP)    Place 2 drops into both eyes 3 (three) times daily.   HYDROCORTISONE CREAM 1 %    Apply to hands and fingers topically three times daily   MENTHOL-METHYL SALICYLATE  (BENGAY GREASELESS EX)    Apply 1 application topically 2 (two) times daily as needed. Apply to neck three times daily as needed   MULTIPLE VITAMIN (MULTIVITAMIN WITH MINERALS) TABS TABLET    Take 1 tablet by mouth daily.   NYSTATIN EX    Apply topically. Apply Nystatin cream 100000 unit/gm to bilateral breast topically two times daily   SENNOSIDES-DOCUSATE SODIUM (SENOKOT-S) 8.6-50 MG TABLET    Take 2 tablets by mouth daily. For constipation  Modified Medications   No medications on file  Discontinued Medications   CEPHALEXIN (KEFLEX) 500 MG CAPSULE    Take 1 capsule (500 mg total) by mouth 2 (two) times daily.   CLOTRIMAZOLE-BETAMETHASONE (LOTRISONE) CREAM    Apply to area between fingers 2 times daily prn   HYDROCHLOROTHIAZIDE (HYDRODIURIL) 12.5 MG TABLET    Take 12.5 mg by mouth daily.     SIGNIFICANT DIAGNOSTIC EXAMS  LABS REVIEWED: DECLINES LABS  08-30-13: wbc 7.2; hgb 9.3; hct 28.3; mcv 90.5; plt 225; gucose 101; bun 54; creat 2.31; k+4.7; na++138; liver normal albumin 3.7  12-03-14: UA: neg  01-11-17: wbc 7.9;  hgb 9.4; hct 28.5; mcv 86.9; plt 243; glucose 152; bun 43; creat 2.12; k+ 4.0; na++ 140     Review of Systems Constitutional: Negative for appetite change and fatigue.   Respiratory: Negative for cough, chest tightness and shortness of breath.   Cardiovascular: Negative for chest pain, palpitations and leg swelling.  Gastrointestinal: Negative for nausea, abdominal pain, diarrhea and constipation.  Musculoskeletal: Negative for myalgias and arthralgias.  Skin: Negative for pallor.  Psychiatric/Behavioral: The patient is not nervous/anxious.     Physical Exam Constitutional: No distress.  Overweight   Eyes: Conjunctivae are normal.  Neck: Neck supple. No JVD present. No thyromegaly present.  Cardiovascular: Normal rate, regular rhythm and intact distal pulses.   Respiratory: Effort normal and breath sounds normal. No respiratory distress. She has no wheezes.  GI:  Soft. Bowel sounds are normal. She exhibits no distension. There is no tenderness.  Musculoskeletal: She exhibits no edema.  Able to move all extremities   Lymphadenopathy:    She has no cervical adenopathy.  Neurological: She is alert.  Skin: Skin is warm and dry. She is not diaphoretic.  Rash on bilateral hands improving.  Psychiatric: She has a normal mood and affect.   ASSESSMENT/ PLAN:  1. Hypertension: she is stable will continue norvasc 10 mg daily;   Will stop the hctz due to her renal function . She will not allow any blood work to be drawn  2. Fungal dermatitis and cellulitis: has completed treatment and will monitor    Synthia Innocent NP [facility]  Contact [phone] Monday through Friday 8am- 5pm  After hours call [phone]

## 2017-01-29 ENCOUNTER — Non-Acute Institutional Stay (SKILLED_NURSING_FACILITY): Payer: Medicare Other | Admitting: Adult Health

## 2017-01-29 ENCOUNTER — Encounter: Payer: Self-pay | Admitting: Adult Health

## 2017-01-29 DIAGNOSIS — J209 Acute bronchitis, unspecified: Secondary | ICD-10-CM

## 2017-01-29 DIAGNOSIS — J0100 Acute maxillary sinusitis, unspecified: Secondary | ICD-10-CM | POA: Diagnosis not present

## 2017-01-29 NOTE — Progress Notes (Signed)
Location:   [name] Room Number: 228 B Place of Service:  SNF (31)   CODE STATUS: DNR  No Known Allergies  Chief Complaint  Patient presents with  . Acute Visit    Cough and Congestion    HPI:  Staff reports that she is coughing and is more fatigued. She tells me that she is tired; has a cough; is congested. There are no reports of fever present.    Past Medical History:  Diagnosis Date  . Cholelithiasis   . Chronic pain   . Constipation   . Diabetes mellitus without complication (HCC)   . Essential hypertension, benign 01/13/2013  . Hyperlipidemia   . Hypertension   . Osteoarthritis   . PUD (peptic ulcer disease)     Past Surgical History:  Procedure Laterality Date  . ABDOMINAL HYSTERECTOMY    . CYSTOPLASTY    . right hip surgery      Social History   Social History  . Marital status: Widowed    Spouse name: N/A  . Number of children: N/A  . Years of education: N/A   Occupational History  . Not on file.   Social History Main Topics  . Smoking status: Never Smoker  . Smokeless tobacco: Never Used  . Alcohol use No  . Drug use: No  . Sexual activity: Not on file   Other Topics Concern  . Not on file   Social History Narrative  . No narrative on file   History reviewed. No pertinent family history.    VITAL SIGNS BP 132/64   Pulse 70   Temp 98 F (36.7 C)   Resp 18   Ht  (1.626 m)   Wt 178 lb 4.8 oz (80.9 kg)   SpO2 96%   BMI 30.61 kg/m   Patient's Medications  New Prescriptions   No medications on file  Previous Medications   ACETAMINOPHEN (TYLENOL) 325 MG TABLET    Take 650 mg by mouth every 6 (six) hours as needed.   AMLODIPINE (NORVASC) 10 MG TABLET    Take 10 mg by mouth daily.   CARBOXYMETHYLCELLUL-GLYCERIN (REFRESH OPTIVE OP)    Place 2 drops into both eyes 3 (three) times daily.   HYDROCORTISONE CREAM 1 %    Apply to hands and fingers topically three times daily   MENTHOL-METHYL SALICYLATE (BENGAY  GREASELESS EX)    Apply 1 application topically 2 (two) times daily as needed. Apply to neck three times daily as needed   MULTIPLE VITAMIN (MULTIVITAMIN WITH MINERALS) TABS TABLET    Take 1 tablet by mouth daily.   NYSTATIN EX    Apply topically. Apply Nystatin cream 100000 unit/gm to bilateral breast topically two times daily   SENNOSIDES-DOCUSATE SODIUM (SENOKOT-S) 8.6-50 MG TABLET    Take 2 tablets by mouth daily. For constipation  Modified Medications   No medications on file  Discontinued Medications   CEPHALEXIN (KEFLEX) 500 MG CAPSULE    Take 1 capsule (500 mg total) by mouth 2 (two) times daily.   HYDROCHLOROTHIAZIDE (HYDRODIURIL) 12.5 MG TABLET    Take 12.5 mg by mouth daily.     SIGNIFICANT DIAGNOSTIC EXAMS   LABS REVIEWED: DECLINES LABS  08-30-13: wbc 7.2; hgb 9.3; hct 28.3; mcv 90.5; plt 225; gucose 101; bun 54; creat 2.31; k+4.7; na++138; liver normal albumin 3.7  12-03-14: UA: neg  01-11-17: wbc 7.9; hgb 9.4; hct 28.5; mcv 86.9; plt 243; glucose 152; bun 43; creat 2.12; k+ 4.0; na++  140     Review of Systems Constitutional: Negative for appetite change and fatigue.   Respiratory: Negative for cough, chest tightness and shortness of breath.   Cardiovascular: Negative for chest pain, palpitations and leg swelling.  Gastrointestinal: Negative for nausea, abdominal pain, diarrhea and constipation.  Musculoskeletal: Negative for myalgias and arthralgias.  Skin: Negative for pallor.  Psychiatric/Behavioral: The patient is not nervous/anxious.     Physical Exam Constitutional: No distress.  Overweight   Eyes: Conjunctivae are normal. has facial tenderness  Neck: Neck supple. No JVD present. No thyromegaly present.  Cardiovascular: Normal rate, regular rhythm and intact distal pulses.   Respiratory: Effort normal rhonchi and wheezes present GI: Soft. Bowel sounds are normal. She exhibits no distension. There is no tenderness.  Musculoskeletal: She exhibits no edema.    Able to move all extremities   Lymphadenopathy:    She has no cervical adenopathy.  Neurological: She is alert.  Skin: Skin is warm and dry. She is not diaphoretic.  Psychiatric: She has a normal mood and affect.   ASSESSMENT/ PLAN:  1. Acute sinusitis 2. Acute bronchitis Will begin augmentin 875 mg twice daily for 10 days with florastor Will being mucinex twice daily for 2 weeks.     MD is aware of resident's narcotic use and is in agreement with current plan of care. We will attempt to wean resident as apropriate   Synthia Innocent NP [facility]  Contact [phone] Monday through Friday 8am- 5pm  After hours call [phone]

## 2017-02-06 ENCOUNTER — Encounter: Payer: Self-pay | Admitting: Adult Health

## 2017-02-06 ENCOUNTER — Non-Acute Institutional Stay (SKILLED_NURSING_FACILITY): Payer: Medicare Other | Admitting: Adult Health

## 2017-02-06 DIAGNOSIS — J0110 Acute frontal sinusitis, unspecified: Secondary | ICD-10-CM

## 2017-02-06 DIAGNOSIS — J209 Acute bronchitis, unspecified: Secondary | ICD-10-CM | POA: Diagnosis not present

## 2017-02-06 NOTE — Progress Notes (Signed)
Location:   [name] Room Number: 228 B Place of Service:  SNF (31)   CODE STATUS: DNR No Known Allergies  Chief Complaint  Patient presents with  . Acute Visit    Refusing meds    HPI:  She has not been taking her abt and mucinex. She tells me that she no longer needs her abt as she is feeling better. She is wanting cough syrup. She  says that she is still tired.   Past Medical History:  Diagnosis Date  . Cholelithiasis   . Chronic pain   . Constipation   . Diabetes mellitus without complication (HCC)   . Essential hypertension, benign 01/13/2013  . Hyperlipidemia   . Hypertension   . Osteoarthritis   . PUD (peptic ulcer disease)     Past Surgical History:  Procedure Laterality Date  . ABDOMINAL HYSTERECTOMY    . CYSTOPLASTY    . right hip surgery      Social History   Social History  . Marital status: Widowed    Spouse name: N/A  . Number of children: N/A  . Years of education: N/A   Occupational History  . Not on file.   Social History Main Topics  . Smoking status: Never Smoker  . Smokeless tobacco: Never Used  . Alcohol use No  . Drug use: No  . Sexual activity: Not on file   Other Topics Concern  . Not on file   Social History Narrative  . No narrative on file   History reviewed. No pertinent family history.    VITAL SIGNS BP 103/65   Pulse 90   Temp 98.6 F (37 C)   Resp 18   Ht  (1.626 m)   Wt 178 lb 5 oz (80.9 kg)   SpO2 98%   BMI 30.61 kg/m   Patient's Medications  New Prescriptions   No medications on file  Previous Medications   ACETAMINOPHEN (TYLENOL) 325 MG TABLET    Take 650 mg by mouth every 6 (six) hours as needed.   AMLODIPINE (NORVASC) 10 MG TABLET    Take 10 mg by mouth daily.   AMOXICILLIN-CLAVULANATE (AUGMENTIN) 875-125 MG TABLET    Take 1 tablet by mouth 2 (two) times daily.   CARBOXYMETHYLCELLUL-GLYCERIN (REFRESH OPTIVE OP)    Place 2 drops into both eyes 3 (three) times daily.   GUAIFENESIN (MUCINEX) 600 MG 12 HR TABLET    Take 600 mg by mouth 2 (two) times daily.   HYDROCORTISONE CREAM 1 %    Apply to hands and fingers topically three times daily   MENTHOL-METHYL SALICYLATE (BENGAY GREASELESS EX)    Apply 1 application topically 2 (two) times daily as needed. Apply to neck three times daily as needed   MULTIPLE VITAMIN (MULTIVITAMIN WITH MINERALS) TABS TABLET    Take 1 tablet by mouth daily.   NYSTATIN EX    Apply topically. Apply Nystatin cream 100000 unit/gm to bilateral breast topically two times daily   SACCHAROMYCES BOULARDII (FLORASTOR) 250 MG CAPSULE    Take 250 mg by mouth 2 (two) times daily.   SENNOSIDES-DOCUSATE SODIUM (SENOKOT-S) 8.6-50 MG TABLET    Take 2 tablets by mouth daily. For constipation  Modified Medications   No medications on file  Discontinued Medications   No medications on file     SIGNIFICANT DIAGNOSTIC EXAMS    LABS REVIEWED: DECLINES LABS  08-30-13: wbc 7.2; hgb 9.3; hct 28.3; mcv 90.5; plt 225; gucose 101; bun 54;  creat 2.31; k+4.7; na++138; liver normal albumin 3.7  12-03-14: UA: neg  01-11-17: wbc 7.9; hgb 9.4; hct 28.5; mcv 86.9; plt 243; glucose 152; bun 43; creat 2.12; k+ 4.0; na++ 140     Review of Systems Constitutional: Negative for appetite change and fatigue.   Respiratory: Negative for chest tightness and shortness of breath.  has cough  Cardiovascular: Negative for chest pain, palpitations and leg swelling.  Gastrointestinal: Negative for nausea, abdominal pain, diarrhea and constipation.  Musculoskeletal: Negative for myalgias and arthralgias.  Skin: Negative for pallor.  Psychiatric/Behavioral: The patient is not nervous/anxious.     Physical Exam Constitutional: No distress.  Overweight   Eyes: Conjunctivae are normal.  Neck: Neck supple. No JVD present. No thyromegaly present.  Cardiovascular: Normal rate, regular rhythm and intact distal pulses.   Respiratory: Effort normal  Has few wheezes present GI:  Soft. Bowel sounds are normal. She exhibits no distension. There is no tenderness.  Musculoskeletal: She exhibits no edema.  Able to move all extremities   Lymphadenopathy:    She has no cervical adenopathy.  Neurological: She is alert.  Skin: Skin is warm and dry. She is not diaphoretic.  Psychiatric: She has a normal mood and affect.     ASSESSMENT/ PLAN:  1. Acute bronchitis 2. Acute sinusitis Will stop her augmentin and mucinex at this time per her request  Will begin robitussin DM 20 cc every 6 hours as needed     MD is aware of resident's narcotic use and is in agreement with current plan of care. We will attempt to wean resident as apropriate   Synthia Innocent NP [facility]  Contact [phone] Monday through Friday 8am- 5pm  After hours call [phone]

## 2017-02-08 ENCOUNTER — Encounter: Payer: Self-pay | Admitting: Adult Health

## 2017-02-08 ENCOUNTER — Non-Acute Institutional Stay (SKILLED_NURSING_FACILITY): Payer: Medicare Other | Admitting: Adult Health

## 2017-02-08 DIAGNOSIS — I10 Essential (primary) hypertension: Secondary | ICD-10-CM | POA: Diagnosis not present

## 2017-02-08 DIAGNOSIS — J302 Other seasonal allergic rhinitis: Secondary | ICD-10-CM

## 2017-02-08 DIAGNOSIS — N184 Chronic kidney disease, stage 4 (severe): Secondary | ICD-10-CM | POA: Diagnosis not present

## 2017-02-08 DIAGNOSIS — K279 Peptic ulcer, site unspecified, unspecified as acute or chronic, without hemorrhage or perforation: Secondary | ICD-10-CM

## 2017-02-08 NOTE — Progress Notes (Signed)
Location:   [name] Room Number: 228 B Place of Service:  SNF (31)   CODE STATUS: DNR  No Known Allergies  Chief Complaint  Patient presents with  . Medical Management of Chronic Issues    1 month follow up    HPI:  She is a long term resident of this facility being seen for the management of her chronic illnesses. She did not complete her abt for her bronchitis. She does have robitussin for her cough. She tells me that she is feeling better. There are no nursing concerns at this time.    Past Medical History:  Diagnosis Date  . Cholelithiasis   . Chronic pain   . Constipation   . Diabetes mellitus without complication (HCC)   . Essential hypertension, benign 01/13/2013  . Hyperlipidemia   . Hypertension   . Osteoarthritis   . PUD (peptic ulcer disease)     Past Surgical History:  Procedure Laterality Date  . ABDOMINAL HYSTERECTOMY    . CYSTOPLASTY    . right hip surgery      Social History   Social History  . Marital status: Widowed    Spouse name: N/A  . Number of children: N/A  . Years of education: N/A   Occupational History  . Not on file.   Social History Main Topics  . Smoking status: Never Smoker  . Smokeless tobacco: Never Used  . Alcohol use No  . Drug use: No  . Sexual activity: Not on file   Other Topics Concern  . Not on file   Social History Narrative  . No narrative on file   History reviewed. No pertinent family history.    VITAL SIGNS BP 112/68   Pulse 90   Temp 98.6 F (37 C)   Resp 18   Ht 5\' 4"  (1.626 m)   Wt 178 lb 5 oz (80.9 kg)   SpO2 98%   BMI 30.61 kg/m   Patient's Medications  New Prescriptions   No medications on file  Previous Medications   ACETAMINOPHEN (TYLENOL) 325 MG TABLET    Take 650 mg by mouth every 8 (eight) hours as needed for mild pain.    AMLODIPINE (NORVASC) 10 MG TABLET    Take 10 mg by mouth daily.   CARBOXYMETHYLCELLUL-GLYCERIN (REFRESH OPTIVE OP)    Place 2 drops into both  eyes 3 (three) times daily.   GUAIFENESIN-DEXTROMETHORPHAN (ROBITUSSIN DM) 100-10 MG/5ML SYRUP    Take 20 mLs by mouth every 6 (six) hours as needed for cough.   HYDROCORTISONE CREAM 1 %    Apply to hands and fingers topically three times daily   MENTHOL-METHYL SALICYLATE (BENGAY GREASELESS EX)    Apply 1 application topically 2 (two) times daily as needed. Apply to feet topically every 12 hours as needed for arthritis pain   MULTIPLE VITAMIN (MULTIVITAMIN WITH MINERALS) TABS TABLET    Take 1 tablet by mouth daily.   NYSTATIN EX    Apply topically. Apply Nystatin cream 100000 unit/gm to bilateral breast topically two times daily   SACCHAROMYCES BOULARDII (FLORASTOR) 250 MG CAPSULE    Take 250 mg by mouth 2 (two) times daily.   SENNOSIDES-DOCUSATE SODIUM (SENOKOT-S) 8.6-50 MG TABLET    Take 2 tablets by mouth daily. For constipation  Modified Medications   No medications on file  Discontinued Medications   AMOXICILLIN-CLAVULANATE (AUGMENTIN) 875-125 MG TABLET    Take 1 tablet by mouth 2 (two) times daily.   GUAIFENESIN (MUCINEX)  600 MG 12 HR TABLET    Take 600 mg by mouth 2 (two) times daily.     SIGNIFICANT DIAGNOSTIC EXAMS    LABS REVIEWED: DECLINES LABS  08-30-13: wbc 7.2; hgb 9.3; hct 28.3; mcv 90.5; plt 225; gucose 101; bun 54; creat 2.31; k+4.7; na++138; liver normal albumin 3.7  12-03-14: UA: neg  01-11-17: wbc 7.9; hgb 9.4; hct 28.5; mcv 86.9; plt 243; glucose 152; bun 43; creat 2.12; k+ 4.0; na++ 140     Review of Systems Constitutional: Negative for appetite change and fatigue.   Respiratory: Negative for cough, chest tightness and shortness of breath.   Cardiovascular: Negative for chest pain, palpitations and leg swelling.  Gastrointestinal: Negative for nausea, abdominal pain, diarrhea and constipation.  Musculoskeletal: Negative for myalgias and arthralgias.  Skin: Negative for pallor.  Psychiatric/Behavioral: The patient is not nervous/anxious.     Physical  Exam Constitutional: No distress.  Overweight   Eyes: Conjunctivae are normal.  Neck: Neck supple. No JVD present. No thyromegaly present.  Cardiovascular: Normal rate, regular rhythm and intact distal pulses.   Respiratory: Effort normal and breath sounds normal. No respiratory distress. She has no wheezes.  GI: Soft. Bowel sounds are normal. She exhibits no distension. There is no tenderness.  Musculoskeletal: She exhibits no edema.  Able to move all extremities   Lymphadenopathy:    She has no cervical adenopathy.  Neurological: She is alert.  Skin: Skin is warm and dry. She is not diaphoretic.  Rash on bilateral hands improving.  Psychiatric: She has a normal mood and affect.   ASSESSMENT/ PLAN:  1. Hypertension: she is stable will continue norvasc 10 mg daily;   She will not allow any blood work to be drawn  2. Osteoarthritis: she is stable will continue bengay to her feet twice daily and tylenol 650 mg every 6 hours as needed   3. Constipation: will continue senna s 2 tabs daily  4. Stage IV CKD: she will not allow for lab work to be done in order to monitor her status; will not make changes and will monitor her status   5. PUD: she is denying any complaints of GI distress; is presently not on medications. Will monitor   6. allergic rhinitis: her flonase was stopped; will monitor      Synthia Innocenteborah Green NP [facility]  Contact [phone] Monday through Friday 8am- 5pm  After hours call [phone]

## 2017-03-12 ENCOUNTER — Non-Acute Institutional Stay (SKILLED_NURSING_FACILITY): Payer: Medicare Other | Admitting: Adult Health

## 2017-03-12 ENCOUNTER — Encounter: Payer: Self-pay | Admitting: Adult Health

## 2017-03-12 DIAGNOSIS — K279 Peptic ulcer, site unspecified, unspecified as acute or chronic, without hemorrhage or perforation: Secondary | ICD-10-CM

## 2017-03-12 DIAGNOSIS — I10 Essential (primary) hypertension: Secondary | ICD-10-CM | POA: Diagnosis not present

## 2017-03-12 DIAGNOSIS — K5901 Slow transit constipation: Secondary | ICD-10-CM

## 2017-03-12 DIAGNOSIS — M150 Primary generalized (osteo)arthritis: Secondary | ICD-10-CM

## 2017-03-12 DIAGNOSIS — N184 Chronic kidney disease, stage 4 (severe): Secondary | ICD-10-CM | POA: Diagnosis not present

## 2017-03-12 DIAGNOSIS — M159 Polyosteoarthritis, unspecified: Secondary | ICD-10-CM

## 2017-03-12 NOTE — Progress Notes (Signed)
Location:   [name] Room Number: 228 B Place of Service:  SNF (31)   CODE STATUS: DNR  No Known Allergies  Chief Complaint  Patient presents with  . Medical Management of Chronic Issues    1 month follow up    HPI:  She is a 83 year old long term resident of this facility being seen for the management of her chronic illnesses. Overall her status is stable. She does not allow for blood work to be drawn. She tells me that she is feeling good. There are no nursing concerns at this time.    Past Medical History:  Diagnosis Date  . Cholelithiasis   . Chronic pain   . Constipation   . Diabetes mellitus without complication (HCC)   . Essential hypertension, benign 01/13/2013  . Hyperlipidemia   . Hypertension   . Osteoarthritis   . PUD (peptic ulcer disease)     Past Surgical History:  Procedure Laterality Date  . ABDOMINAL HYSTERECTOMY    . CYSTOPLASTY    . right hip surgery      Social History   Social History  . Marital status: Widowed    Spouse name: N/A  . Number of children: N/A  . Years of education: N/A   Occupational History  . Not on file.   Social History Main Topics  . Smoking status: Never Smoker  . Smokeless tobacco: Never Used  . Alcohol use No  . Drug use: No  . Sexual activity: Not on file   Other Topics Concern  . Not on file   Social History Narrative  . No narrative on file   History reviewed. No pertinent family history.    VITAL SIGNS BP 124/68   Pulse 78   Temp 98.1 F (36.7 C)   Resp 18   Ht 5\' 4"  (1.626 m)   Wt 178 lb 5 oz (80.9 kg)   SpO2 97%   BMI 30.61 kg/m   Patient's Medications  New Prescriptions   No medications on file  Previous Medications   ACETAMINOPHEN (TYLENOL) 325 MG TABLET    Take 650 mg by mouth every 8 (eight) hours as needed for mild pain.    AMLODIPINE (NORVASC) 10 MG TABLET    Take 10 mg by mouth daily.   CARBOXYMETHYLCELLUL-GLYCERIN (REFRESH OPTIVE OP)    Place 2 drops into both  eyes 3 (three) times daily.   GUAIFENESIN-DEXTROMETHORPHAN (ROBITUSSIN DM) 100-10 MG/5ML SYRUP    Take 20 mLs by mouth every 6 (six) hours as needed for cough.   HYDROCORTISONE CREAM 1 %    Apply to hands and fingers topically three times daily   MENTHOL-METHYL SALICYLATE (BENGAY GREASELESS EX)    Apply 1 application topically 2 (two) times daily as needed. Apply to feet topically every 12 hours as needed for arthritis pain   MULTIPLE VITAMIN (MULTIVITAMIN WITH MINERALS) TABS TABLET    Take 1 tablet by mouth daily.   NYSTATIN EX    Apply topically. Apply Nystatin cream 100000 unit/gm to bilateral breast topically two times daily   SENNOSIDES-DOCUSATE SODIUM (SENOKOT-S) 8.6-50 MG TABLET    Take 2 tablets by mouth daily. For constipation  Modified Medications   No medications on file  Discontinued Medications   No medications on file     SIGNIFICANT DIAGNOSTIC EXAMS  LABS REVIEWED: DECLINES LABS  08-30-13: wbc 7.2; hgb 9.3; hct 28.3; mcv 90.5; plt 225; gucose 101; bun 54; creat 2.31; k+4.7; na++138; liver normal albumin  3.7  12-03-14: UA: neg  01-11-17: wbc 7.9; hgb 9.4; hct 28.5; mcv 86.9; plt 243; glucose 152; bun 43; creat 2.12; k+ 4.0; na++ 140     Review of Systems Constitutional: Negative for appetite change and fatigue.   Respiratory: Negative for cough, chest tightness and shortness of breath.   Cardiovascular: Negative for chest pain, palpitations and leg swelling.  Gastrointestinal: Negative for nausea, abdominal pain, diarrhea and constipation.  Musculoskeletal: Negative for myalgias and arthralgias.  Skin: Negative for pallor.  Psychiatric/Behavioral: The patient is not nervous/anxious.     Physical Exam Constitutional: No distress.  Overweight   Eyes: Conjunctivae are normal.  Neck: Neck supple. No JVD present. No thyromegaly present.  Cardiovascular: Normal rate, regular rhythm and intact distal pulses.   Respiratory: Effort normal and breath sounds normal. No  respiratory distress. She has no wheezes.  GI: Soft. Bowel sounds are normal. She exhibits no distension. There is no tenderness.  Musculoskeletal: She exhibits no edema.  Able to move all extremities   Lymphadenopathy:    She has no cervical adenopathy.  Neurological: She is alert.  Skin: Skin is warm and dry. She is not diaphoretic.  Rash on bilateral hands improving.  Psychiatric: She has a normal mood and affect.   ASSESSMENT/ PLAN:  1. Hypertension: b/p 124/78  she is stable will continue norvasc 10 mg daily;   She will not allow any blood work to be drawn  2. Osteoarthritis: she is stable will continue bengay to her feet twice daily and tylenol 650 mg every 6 hours as needed   3. Constipation: will continue senna s 2 tabs daily  4. Stage IV CKD: she will not allow for lab work to be done in order to monitor her status; will not make changes and will monitor her status   5. PUD: she is denying any complaints of GI distress; is presently not on medications. Will monitor   6. allergic rhinitis: her flonase was stopped; will monitor    MD is aware of resident's narcotic use and is in agreement with current plan of care. We will attempt to wean resident as apropriate     Synthia Innocenteborah Ashlinn Hemrick NP [facility]  Contact [phone] Monday through Friday 8am- 5pm  After hours call [phone]

## 2017-04-01 ENCOUNTER — Non-Acute Institutional Stay (SKILLED_NURSING_FACILITY): Payer: Medicare Other

## 2017-04-01 DIAGNOSIS — Z0000 Encounter for general adult medical examination without abnormal findings: Secondary | ICD-10-CM

## 2017-04-01 NOTE — Patient Instructions (Signed)
Ms. Chelsea [name] , Thank you for taking time to come for your Medicare Wellness Visit. I appreciate your ongoing commitment to your health goals. Please review the following plan we discussed and let me know if I can assist you in the future.   Screening recommendations/referrals: Colonoscopy up to date, long term pt Mammogram up to date, long term pt Bone Density up to date Recommended yearly ophthalmology/optometry visit for glaucoma screening and checkup Recommended yearly dental visit for hygiene and checkup  Vaccinations: Influenza vaccine up to date. Due 07/15/2017 Pneumococcal vaccine up to date Tdap vaccine due 11/10/2024 Shingles vaccine not in records  Advanced directives: Need a copy for chart  Conditions/risks identified: None  Next appointment: Dr. Montez Moritaarter makes rounds   Preventive Care 65 Years and Older, Female Preventive care refers to lifestyle choices and visits with your health care provider that can promote health and wellness. What does preventive care include?  A yearly physical exam. This is also called an annual well check.  Dental exams once or twice a year.  Routine eye exams. Ask your health care provider how often you should have your eyes checked.  Personal lifestyle choices, including:  Daily care of your teeth and gums.  Regular physical activity.  Eating a healthy diet.  Avoiding tobacco and drug use.  Limiting alcohol use.  Practicing safe sex.  Taking low-dose aspirin every day.  Taking vitamin and mineral supplements as recommended by your health care provider. What happens during an annual well check? The services and screenings done by your health care provider during your annual well check will depend on your age, overall health, lifestyle risk factors, and family history of disease. Counseling  Your health care provider may ask you questions about your:  Alcohol use.  Tobacco use.  Drug use.  Emotional well-being.  Home and  relationship well-being.  Sexual activity.  Eating habits.  History of falls.  Memory and ability to understand (cognition).  Work and work Astronomerenvironment.  Reproductive health. Screening  You may have the following tests or measurements:  Height, weight, and BMI.  Blood pressure.  Lipid and cholesterol levels. These may be checked every 5 years, or more frequently if you are over 81 years old.  Skin check.  Lung cancer screening. You may have this screening every year starting at age 81 if you have a 30-pack-year history of smoking and currently smoke or have quit within the past 15 years.  Fecal occult blood test (FOBT) of the stool. You may have this test every year starting at age 81.  Flexible sigmoidoscopy or colonoscopy. You may have a sigmoidoscopy every 5 years or a colonoscopy every 10 years starting at age 81.  Hepatitis C blood test.  Hepatitis B blood test.  Sexually transmitted disease (STD) testing.  Diabetes screening. This is done by checking your blood sugar (glucose) after you have not eaten for a while (fasting). You may have this done every 1-3 years.  Bone density scan. This is done to screen for osteoporosis. You may have this done starting at age 81.  Mammogram. This may be done every 1-2 years. Talk to your health care provider about how often you should have regular mammograms. Talk with your health care provider about your test results, treatment options, and if necessary, the need for more tests. Vaccines  Your health care provider may recommend certain vaccines, such as:  Influenza vaccine. This is recommended every year.  Tetanus, diphtheria, and acellular pertussis (Tdap, Td) vaccine.  You may need a Td booster every 10 years.  Zoster vaccine. You may need this after age 24.  Pneumococcal 13-valent conjugate (PCV13) vaccine. One dose is recommended after age 42.  Pneumococcal polysaccharide (PPSV23) vaccine. One dose is recommended after  age 68. Talk to your health care provider about which screenings and vaccines you need and how often you need them. This information is not intended to replace advice given to you by your health care provider. Make sure you discuss any questions you have with your health care provider. Document Released: 10/21/2015 Document Revised: 06/13/2016 Document Reviewed: 07/26/2015 Elsevier Interactive Patient Education  2017 [place] Prevention in the Home Falls can cause injuries. They can happen to people of all ages. There are many things you can do to make your home safe and to help prevent falls. What can I do on the outside of my home?  Regularly fix the edges of walkways and driveways and fix any cracks.  Remove anything that might make you trip as you walk through a door, such as a raised step or threshold.  Trim any bushes or trees on the path to your home.  Use bright outdoor lighting.  Clear any walking paths of anything that might make someone trip, such as rocks or tools.  Regularly check to see if handrails are loose or broken. Make sure that both sides of any steps have handrails.  Any raised decks and porches should have guardrails on the edges.  Have any leaves, snow, or ice cleared regularly.  Use sand or salt on walking paths during winter.  Clean up any spills in your garage right away. This includes oil or grease spills. What can I do in the bathroom?  Use night lights.  Install grab bars by the toilet and in the tub and shower. Do not use towel bars as grab bars.  Use non-skid mats or decals in the tub or shower.  If you need to sit down in the shower, use a plastic, non-slip stool.  Keep the floor dry. Clean up any water that spills on the floor as soon as it happens.  Remove soap buildup in the tub or shower regularly.  Attach bath mats securely with double-sided non-slip rug tape.  Do not have throw rugs and other things on the floor that can  make you trip. What can I do in the bedroom?  Use night lights.  Make sure that you have a light by your bed that is easy to reach.  Do not use any sheets or blankets that are too big for your bed. They should not hang down onto the floor.  Have a firm chair that has side arms. You can use this for support while you get dressed.  Do not have throw rugs and other things on the floor that can make you trip. What can I do in the kitchen?  Clean up any spills right away.  Avoid walking on wet floors.  Keep items that you use a lot in easy-to-reach places.  If you need to reach something above you, use a strong step stool that has a grab bar.  Keep electrical cords out of the way.  Do not use floor polish or wax that makes floors slippery. If you must use wax, use non-skid floor wax.  Do not have throw rugs and other things on the floor that can make you trip. What can I do with my stairs?  Do not leave any  items on the stairs.  Make sure that there are handrails on both sides of the stairs and use them. Fix handrails that are broken or loose. Make sure that handrails are as long as the stairways.  Check any carpeting to make sure that it is firmly attached to the stairs. Fix any carpet that is loose or worn.  Avoid having throw rugs at the top or bottom of the stairs. If you do have throw rugs, attach them to the floor with carpet tape.  Make sure that you have a light switch at the top of the stairs and the bottom of the stairs. If you do not have them, ask someone to add them for you. What else can I do to help prevent falls?  Wear shoes that:  Do not have high heels.  Have rubber bottoms.  Are comfortable and fit you well.  Are closed at the toe. Do not wear sandals.  If you use a stepladder:  Make sure that it is fully opened. Do not climb a closed stepladder.  Make sure that both sides of the stepladder are locked into place.  Ask someone to hold it for you,  if possible.  Clearly mark and make sure that you can see:  Any grab bars or handrails.  First and last steps.  Where the edge of each step is.  Use tools that help you move around (mobility aids) if they are needed. These include:  Canes.  Walkers.  Scooters.  Crutches.  Turn on the lights when you go into a dark area. Replace any light bulbs as soon as they burn out.  Set up your furniture so you have a clear path. Avoid moving your furniture around.  If any of your floors are uneven, fix them.  If there are any pets around you, be aware of where they are.  Review your medicines with your doctor. Some medicines can make you feel dizzy. This can increase your chance of falling. Ask your doctor what other things that you can do to help prevent falls. This information is not intended to replace advice given to you by your health care provider. Make sure you discuss any questions you have with your health care provider. Document Released: 07/21/2009 Document Revised: 03/01/2016 Document Reviewed: 10/29/2014 Elsevier Interactive Patient Education  2017 [employer].

## 2017-04-01 NOTE — Progress Notes (Signed)
Subjective:   Chelsea LowersBertha E [name] is a 83 y.o. female who presents for an Initial Medicare Annual Wellness Visit at [facility].       Objective:    Today's Vitals   04/01/17 1451  BP: 130/65  Pulse: 73  Temp: 98.2 F (36.8 C)  TempSrc: Oral  SpO2: 97%  Weight: 178 lb (80.7 kg)  Height: 5\' 4"  (1.626 m)   Body mass index is 30.55 kg/m.   Current Medications (verified) Outpatient Encounter Prescriptions as of 04/01/2017  Medication Sig  . acetaminophen (TYLENOL) 325 MG tablet Take 650 mg by mouth every 8 (eight) hours as needed for mild pain.   Marland Kitchen. amLODipine (NORVASC) 10 MG tablet Take 10 mg by mouth daily.  . Carboxymethylcellul-Glycerin (REFRESH OPTIVE OP) Place 2 drops into both eyes 3 (three) times daily.  Marland Kitchen. guaiFENesin-dextromethorphan (ROBITUSSIN DM) 100-10 MG/5ML syrup Take 20 mLs by mouth every 6 (six) hours as needed for cough.  . hydrocortisone cream 1 % Apply to hands and fingers topically three times daily  . Menthol-Methyl Salicylate (BENGAY GREASELESS EX) Apply 1 application topically 2 (two) times daily as needed. Apply to feet topically every 12 hours as needed for arthritis pain  . Multiple Vitamin (MULTIVITAMIN WITH MINERALS) TABS tablet Take 1 tablet by mouth daily.  Morton Stall. NYSTATIN EX Apply topically. Apply Nystatin cream 100000 unit/gm to bilateral breast topically two times daily  . sennosides-docusate sodium (SENOKOT-S) 8.6-50 MG tablet Take 2 tablets by mouth daily. For constipation   No facility-administered encounter medications on file as of 04/01/2017.     Allergies (verified) Patient has no known allergies.   History: Past Medical History:  Diagnosis Date  . Cholelithiasis   . Chronic pain   . Constipation   . Diabetes mellitus without complication (HCC)   . Essential hypertension, benign 01/13/2013  . Hyperlipidemia   . Hypertension   . Osteoarthritis   . PUD (peptic ulcer disease)    Past Surgical History:  Procedure Laterality Date   . ABDOMINAL HYSTERECTOMY    . CYSTOPLASTY    . right hip surgery     History reviewed. No pertinent family history. Social History   Occupational History  . Not on file.   Social History Main Topics  . Smoking status: Never Smoker  . Smokeless tobacco: Never Used  . Alcohol use No  . Drug use: No  . Sexual activity: Not on file    Tobacco Counseling Counseling given: Not Answered   Activities of Daily Living In your present state of health, do you have any difficulty performing the following activities: 04/01/2017  Hearing? Y  Vision? Y  Difficulty concentrating or making decisions? Y  Walking or climbing stairs? Y  Dressing or bathing? Y  Doing errands, shopping? Y  Preparing Food and eating ? Y  Using the Toilet? Y  In the past six months, have you accidently leaked urine? Y  Do you have problems with loss of bowel control? Y  Managing your Medications? Y  Managing your Finances? Y  Housekeeping or managing your Housekeeping? Y  Some recent data might be hidden    Immunizations and Health Maintenance Immunization History  Administered Date(s) Administered  . Influenza Whole 07/20/2013  . Influenza-Unspecified 07/19/2014, 07/15/2016  . PPD Test 05/30/2011, 06/29/2016, 07/06/2016  . Pneumococcal-Unspecified 05/17/2011   There are no preventive care reminders to display for this patient.  Patient Care Team: Kirt Boysarter, Monica, DO as PCP - General (Internal Medicine) Sharee HolsterGreen, Deborah S,  NP as Nurse Practitioner (Nurse Practitioner) [name]  Indicate any recent Medical Services you may have received from other than [facility] providers in the past year (date may be approximate).     Assessment:   This is a routine wellness examination for Chelsea [name].   Hearing/Vision screen No exam data present  Dietary issues and exercise activities discussed: Current Exercise Habits: The patient does not participate in regular exercise at  present, Exercise limited by: orthopedic condition(s)  Goals    None     Depression Screen PHQ 2/9 Scores 04/01/2017 11/10/2014  PHQ - 2 Score 0 -  Exception Documentation - Patient refusal    Fall Risk Fall Risk  04/01/2017 11/10/2014  Falls in the past year? No No    Cognitive Function:     6CIT Screen 04/01/2017  What Year? 4 points  What month? 3 points  What time? 3 points  Count back from 20 4 points  Months in reverse 4 points  Repeat phrase 10 points  Total Score 28    Screening Tests Health Maintenance  Topic Date Due  . OPHTHALMOLOGY EXAM  12/12/2017 (Originally 11/11/2015)  . INFLUENZA VACCINE  05/08/2017  . FOOT EXAM  05/11/2017  . TETANUS/TDAP  11/10/2024  . DEXA SCAN  Completed  . PNA vac Low Risk Adult  Completed      Plan:    I have personally reviewed and addressed the Medicare Annual Wellness questionnaire and have noted the following in the patient's chart:  A. Medical and social history B. Use of alcohol, tobacco or illicit drugs  C. Current medications and supplements D. Functional ability and status E.  Nutritional status F.  Physical activity G. Advance directives H. List of other physicians I.  Hospitalizations, surgeries, and ER visits in previous 12 months J.  Vitals K. Screenings to include hearing, vision, cognitive, depression L. Referrals and appointments - none  In addition, I have reviewed and discussed with patient certain preventive protocols, quality metrics, and best practice recommendations. A written personalized care plan for preventive services as well as general preventive health recommendations were provided to patient.  See attached scanned questionnaire for additional information.   Signed,   Annetta Maw, RN Nurse Health Advisor   Quick Notes   Health Maintenance: eye exam due     Abnormal Screen: 6 Cit-28     Patient Concerns: None     Nurse Concerns: None

## 2017-04-09 ENCOUNTER — Encounter: Payer: Self-pay | Admitting: Adult Health

## 2017-04-09 ENCOUNTER — Non-Acute Institutional Stay (SKILLED_NURSING_FACILITY): Payer: Medicare Other | Admitting: Adult Health

## 2017-04-09 DIAGNOSIS — K279 Peptic ulcer, site unspecified, unspecified as acute or chronic, without hemorrhage or perforation: Secondary | ICD-10-CM | POA: Diagnosis not present

## 2017-04-09 DIAGNOSIS — M159 Polyosteoarthritis, unspecified: Secondary | ICD-10-CM

## 2017-04-09 DIAGNOSIS — M150 Primary generalized (osteo)arthritis: Secondary | ICD-10-CM

## 2017-04-09 DIAGNOSIS — N184 Chronic kidney disease, stage 4 (severe): Secondary | ICD-10-CM

## 2017-04-09 DIAGNOSIS — I10 Essential (primary) hypertension: Secondary | ICD-10-CM

## 2017-04-09 NOTE — Progress Notes (Signed)
Provider:  Synthia Innocent, NP Location:  [name] Room Number: 228 B Place of Service:  SNF (31)   PCP: Kirt Boys, DO Patient Care Team: Kirt Boys, DO as PCP - General (Internal Medicine) Chilton Si Chong Sicilian, NP as Nurse Practitioner (Nurse Practitioner) [name]  Extended Emergency Contact Information Primary Emergency Contact: Lacewell,[name] Address: [address]          [place], [place] Home Phone: [phone] Relation: Other  Code Status: DNR Goals of Care: Advanced Directive information Advanced Directives 04/09/2017  Does Patient Have a Medical Advance Directive? Yes  Type of Advance Directive Out of facility DNR (pink MOST or yellow form)  Does patient want to make changes to medical advance directive? No - Patient declined  Copy of Healthcare Power of Attorney in Chart? -  Pre-existing out of facility DNR order (yellow form or pink MOST form) Pink MOST form placed in chart (order not valid for inpatient use)      No Known Allergies   Chief Complaint  Patient presents with  . Annual Exam    Yearly Exam    HPI: Patient is a 83 y.o. female seen today for an annual comprehensive examination. She has been in the ED for cellulitis. She has also been treated for pneumonia in April. Overall her status is stable. She tells me that she is feeling good. There are no nursing concerns at this time.    Past Medical History:  Diagnosis Date  . Cholelithiasis   . Chronic pain   . Constipation   . Diabetes mellitus without complication (HCC)   . Essential hypertension, benign 01/13/2013  . Hyperlipidemia   . Hypertension   . Osteoarthritis   . PUD (peptic ulcer disease)    Past Surgical History:  Procedure Laterality Date  . ABDOMINAL HYSTERECTOMY    . CYSTOPLASTY    . right hip surgery      reports that she has never smoked. She has never used smokeless tobacco. She reports that she does not drink alcohol  or use drugs. Social History   Social History  . Marital status: Widowed    Spouse name: N/A  . Number of children: N/A  . Years of education: N/A   Occupational History  . Not on file.   Social History Main Topics  . Smoking status: Never Smoker  . Smokeless tobacco: Never Used  . Alcohol use No  . Drug use: No  . Sexual activity: Not on file   Other Topics Concern  . Not on file   Social History Narrative  . No narrative on file   History reviewed. No pertinent family history.  Vitals:   04/09/17 1234  BP: 120/78  Pulse: 74  Resp: 16  Temp: 98.4 F (36.9 C)  SpO2: 97%  Weight: 170 lb 3.2 oz (77.2 kg)  Height: 5\' 4"  (1.626 m)   Body mass index is 29.21 kg/m.   Outpatient Encounter Prescriptions as of 04/09/2017  Medication Sig  . acetaminophen (TYLENOL) 325 MG tablet Take 650 mg by mouth every 8 (eight) hours as needed for mild pain.   Marland Kitchen amLODipine (NORVASC) 10 MG tablet Take 10 mg by mouth daily.  . Carboxymethylcellul-Glycerin (REFRESH OPTIVE OP) Place 2 drops into both eyes 3 (three) times daily.  Marland Kitchen guaiFENesin-dextromethorphan (ROBITUSSIN DM) 100-10 MG/5ML syrup Take 20 mLs by mouth every 6 (six) hours as needed for cough.  . hydrocortisone cream 1 % Apply to hands and fingers topically  three times daily  . Menthol-Methyl Salicylate (BENGAY GREASELESS EX) Apply 1 application topically 2 (two) times daily as needed. Apply to feet topically every 12 hours as needed for arthritis pain  . Multiple Vitamin (MULTIVITAMIN WITH MINERALS) TABS tablet Take 1 tablet by mouth daily.  . sennosides-docusate sodium (SENOKOT-S) 8.6-50 MG tablet Take 2 tablets by mouth daily. For constipation  . [DISCONTINUED] NYSTATIN EX Apply topically. Apply Nystatin cream 100000 unit/gm to bilateral breast topically two times daily   No facility-administered encounter medications on file as of 04/09/2017.      SIGNIFICANT DIAGNOSTIC EXAMS  LABS REVIEWED: DECLINES LABS  08-30-13: wbc  7.2; hgb 9.3; hct 28.3; mcv 90.5; plt 225; gucose 101; bun 54; creat 2.31; k+4.7; na++138; liver normal albumin 3.7  12-03-14: UA: neg  01-11-17: wbc 7.9; hgb 9.4; hct 28.5; mcv 86.9; plt 243; glucose 152; bun 43; creat 2.12; k+ 4.0; na++ 140     Review of Systems Constitutional: Negative for appetite change and fatigue.   Respiratory: Negative for cough, chest tightness and shortness of breath.   Cardiovascular: Negative for chest pain, palpitations and leg swelling.  Gastrointestinal: Negative for nausea, abdominal pain, diarrhea and constipation.  Musculoskeletal: Negative for myalgias and arthralgias.  Skin: Negative for pallor.  Psychiatric/Behavioral: The patient is not nervous/anxious.     Physical Exam Constitutional: No distress.  Overweight   Eyes: Conjunctivae are normal.  Neck: Neck supple. No JVD present. No thyromegaly present.  Cardiovascular: Normal rate, regular rhythm and intact distal pulses.   Respiratory: Effort normal and breath sounds normal. No respiratory distress. She has no wheezes.  GI: Soft. Bowel sounds are normal. She exhibits no distension. There is no tenderness.  Musculoskeletal: She exhibits no edema.  Able to move all extremities   Lymphadenopathy:    She has no cervical adenopathy.  Neurological: She is alert.  Skin: Skin is warm and dry. She is not diaphoretic.  Psychiatric: She has a normal mood and affect.   ASSESSMENT/ PLAN:  1. Hypertension: b/p 120/78  she is stable will continue norvasc 10 mg daily;   She will not allow any blood work to be drawn  2. Osteoarthritis: she is stable will continue  tylenol 650 mg every 6 hours as needed   3. Constipation: will continue senna s 2 tabs daily  4. Stage IV CKD: she will not allow for lab work to be done in order to monitor her status; will not make changes and will monitor her status   5. PUD: she is denying any complaints of GI distress; is presently not on medications. Will monitor   6.  allergic rhinitis: her flonase was stopped; will monitor   Health care maintenance up to date  Time spent with patient  45  minutes >50% time spent counseling; reviewing medical record; tests; labs; and developing future plan of care    MD is aware of resident's narcotic use and is in agreement with current plan of care. We will wean dosage as appropriate for resident     Synthia InnocentDeborah Green NP [facility]  Contact [phone] Monday through Friday 8am- 5pm  After hours call [phone]

## 2017-05-14 ENCOUNTER — Encounter: Payer: Self-pay | Admitting: Adult Health

## 2017-05-14 NOTE — Progress Notes (Signed)
Entered in error

## 2017-05-16 ENCOUNTER — Non-Acute Institutional Stay (SKILLED_NURSING_FACILITY): Payer: Medicare Other | Admitting: Internal Medicine

## 2017-05-16 ENCOUNTER — Encounter: Payer: Self-pay | Admitting: Internal Medicine

## 2017-05-16 DIAGNOSIS — R627 Adult failure to thrive: Secondary | ICD-10-CM | POA: Diagnosis not present

## 2017-05-16 DIAGNOSIS — M150 Primary generalized (osteo)arthritis: Secondary | ICD-10-CM | POA: Diagnosis not present

## 2017-05-16 DIAGNOSIS — N184 Chronic kidney disease, stage 4 (severe): Secondary | ICD-10-CM | POA: Diagnosis not present

## 2017-05-16 DIAGNOSIS — M159 Polyosteoarthritis, unspecified: Secondary | ICD-10-CM

## 2017-05-16 DIAGNOSIS — I10 Essential (primary) hypertension: Secondary | ICD-10-CM | POA: Diagnosis not present

## 2017-05-16 DIAGNOSIS — R634 Abnormal weight loss: Secondary | ICD-10-CM

## 2017-05-16 NOTE — Progress Notes (Signed)
Patient ID: Chelsea [name], female   DOB: 1918/11/02, 83 y.o.   MRN: [id number]     DATE:  May 16, 2017  Location:   [facility] Room Number: [place] of Service: SNF (31)   Extended Emergency Contact Information Primary Emergency Contact: Scouten,[name] Address: [address]          [place], [place] Phone: [phone] Relation: Other  Advanced Directive information Does Patient Have a Medical Advance Directive?: Yes, Type of Advance Directive: Out of facility DNR (pink MOST or yellow form), Pre-existing out of facility DNR order (yellow form or pink MOST form): Pink MOST form placed in chart (order not valid for inpatient use), Does patient want to make changes to medical advance directive?: No - Patient declined  Chief Complaint  Patient presents with  . Medical Management of Chronic Issues    1 month follow up    HPI:  83 yo female long term resident seen today for f/u. She has no concerns. She reports appetite is good but, according to her chart, she is progressively losing weight likely 2/2 FTT. Weight down 3 lbs since last month (down 11 lbs since Apr 2018). She has a hx CKD but refuses all labs. No nursing issues. No recent falls. Further HPI limited due to severe hearing loss. Hx obtained from chart. AWV completed 04/01/17.  Hypertension - BP stable on norvasc 10 mg daily; she refuses blood labs to be drawn  Osteoarthritis - pain controlled on tylenol 650 mg every 6 hours as needed   Constipation - stable on senna s 2 tabs daily  CKD - stage 4. She refuses blood lab tests to follow level.  PUD - stable.  allergic rhinitis - stable.     Past Medical History:  Diagnosis Date  . Cholelithiasis   . Chronic pain   . Constipation   . Diabetes mellitus without complication ([place])   . Essential hypertension, benign 01/13/2013  . Hyperlipidemia   . Hypertension   . Osteoarthritis   . PUD (peptic ulcer disease)     Past Surgical History:  Procedure  Laterality Date  . ABDOMINAL HYSTERECTOMY    . CYSTOPLASTY    . right hip surgery      Patient Care Team: Gildardo Cranker, DO as PCP - General (Internal Medicine) Nyoka Cowden Phylis Bougie, NP as Nurse Practitioner (Nurse Practitioner) [facility] [place] [facility]  Social History   Social History  . Marital status: Widowed    Spouse name: N/A  . Number of children: N/A  . Years of education: N/A   Occupational History  . Not on file.   Social History Main Topics  . Smoking status: Never Smoker  . Smokeless tobacco: Never Used  . Alcohol use No  . Drug use: No  . Sexual activity: Not on file   Other Topics Concern  . Not on file   Social History Narrative  . No narrative on file     reports that she has never smoked. She has never used smokeless tobacco. She reports that she does not drink alcohol or use drugs.  History reviewed. No pertinent family history. No family status information on file.    Immunization History  Administered Date(s) Administered  . Influenza Whole 07/20/2013  . Influenza-Unspecified 07/19/2014, 07/15/2016  . PPD Test 05/30/2011, 06/29/2016, 07/06/2016  . Pneumococcal-Unspecified 05/17/2011    No Known Allergies  Medications: Patient's Medications  New Prescriptions   No medications on file  Previous  Medications   ACETAMINOPHEN (TYLENOL) 325 MG TABLET    Take 650 mg by mouth every 8 (eight) hours as needed for mild pain.    AMLODIPINE (NORVASC) 10 MG TABLET    Take 10 mg by mouth daily.   CARBOXYMETHYLCELLUL-GLYCERIN (REFRESH OPTIVE OP)    Place 2 drops into both eyes 3 (three) times daily.   GUAIFENESIN-DEXTROMETHORPHAN (ROBITUSSIN DM) 100-10 MG/5ML SYRUP    Take 20 mLs by mouth every 6 (six) hours as needed for cough.   HYDROCORTISONE CREAM 1 %    Apply to hands and fingers topically three times daily   MULTIPLE VITAMIN (MULTIVITAMIN WITH MINERALS) TABS TABLET    Take 1 tablet by mouth daily.   SENNOSIDES-DOCUSATE  SODIUM (SENOKOT-S) 8.6-50 MG TABLET    Take 2 tablets by mouth daily. For constipation  Modified Medications   No medications on file  Discontinued Medications   No medications on file    Review of Systems  Reason unable to perform ROS: severely HOH.  HENT: Positive for hearing loss.   All other systems reviewed and are negative.   Vitals:   05/16/17 1043  BP: 110/68  Pulse: 82  Resp: 17  Temp: 98 F (36.7 C)  SpO2: 97%  Weight: 167 lb 3.2 oz (75.8 kg)  Height: 5' 4" (1.626 m)   Body mass index is 28.7 kg/m.  Physical Exam  Constitutional: She appears well-developed.  Frail appearing in NAD, sitting in w/c  HENT:  Mouth/Throat: Oropharynx is clear and moist. No oropharyngeal exudate.  MMM; no oral thrush  Eyes: Pupils are equal, round, and reactive to light. No scleral icterus.  Neck: Neck supple. Carotid bruit is not present. No tracheal deviation present.  Cardiovascular: Normal rate, regular rhythm and intact distal pulses.  Exam reveals no gallop and no friction rub.   Murmur (1/6 SEM) heard. No LE edema b/l. no calf TTP.   Pulmonary/Chest: Effort normal and breath sounds normal. No stridor. No respiratory distress. She has no wheezes. She has no rales.  Abdominal: Soft. Normal appearance and bowel sounds are normal. She exhibits no distension and no mass. There is no hepatomegaly. There is no tenderness. There is no rigidity, no rebound and no guarding. No hernia.  obese  Musculoskeletal: She exhibits edema.  Lymphadenopathy:    She has no cervical adenopathy.  Neurological: She is alert.  Skin: Skin is warm and dry. No rash noted.  Psychiatric: She has a normal mood and affect. Her behavior is normal. Thought content normal.     Labs reviewed: No visits with results within 3 Month(s) from this visit.  Latest known visit with results is:  Admission on 01/11/2017, Discharged on 01/12/2017  Component Date Value Ref Range Status  . Sodium 01/11/2017 140  135 -  145 mmol/L Final  . Potassium 01/11/2017 4.0  3.5 - 5.1 mmol/L Final  . Chloride 01/11/2017 108  101 - 111 mmol/L Final  . CO2 01/11/2017 24  22 - 32 mmol/L Final  . Glucose, Bld 01/11/2017 152* 65 - 99 mg/dL Final  . BUN 01/11/2017 43* 6 - 20 mg/dL Final  . Creatinine, Ser 01/11/2017 2.12* 0.44 - 1.00 mg/dL Final  . Calcium 01/11/2017 9.4  8.9 - 10.3 mg/dL Final  . GFR calc non Af Amer 01/11/2017 19* >60 mL/min Final  . GFR calc Af Amer 01/11/2017 22* >60 mL/min Final   Comment: (NOTE) The eGFR has been calculated using the CKD EPI equation. This calculation has not been validated  in all clinical situations. eGFR's persistently <60 mL/min signify possible Chronic Kidney Disease.   . Anion gap 01/11/2017 8  5 - 15 Final  . Color, Urine 01/11/2017 YELLOW  YELLOW Final  . APPearance 01/11/2017 HAZY* CLEAR Final  . Specific Gravity, Urine 01/11/2017 1.011  1.005 - 1.030 Final  . pH 01/11/2017 6.0  5.0 - 8.0 Final  . Glucose, UA 01/11/2017 NEGATIVE  NEGATIVE mg/dL Final  . Hgb urine dipstick 01/11/2017 SMALL* NEGATIVE Final  . Bilirubin Urine 01/11/2017 NEGATIVE  NEGATIVE Final  . Ketones, ur 01/11/2017 NEGATIVE  NEGATIVE mg/dL Final  . Protein, ur 01/11/2017 100* NEGATIVE mg/dL Final  . Nitrite 01/11/2017 NEGATIVE  NEGATIVE Final  . Leukocytes, UA 01/11/2017 MODERATE* NEGATIVE Final  . RBC / HPF 01/11/2017 6-30  0 - 5 RBC/hpf Final  . WBC, UA 01/11/2017 TOO NUMEROUS TO COUNT  0 - 5 WBC/hpf Final  . Bacteria, UA 01/11/2017 MANY* NONE SEEN Final  . Squamous Epithelial / LPF 01/11/2017 0-5* NONE SEEN Final  . Mucous 01/11/2017 PRESENT   Final  . WBC 01/11/2017 7.9  4.0 - 10.5 K/uL Final  . RBC 01/11/2017 3.28* 3.87 - 5.11 MIL/uL Final  . Hemoglobin 01/11/2017 9.4* 12.0 - 15.0 g/dL Final  . HCT 01/11/2017 28.5* 36.0 - 46.0 % Final  . MCV 01/11/2017 86.9  78.0 - 100.0 fL Final  . MCH 01/11/2017 28.7  26.0 - 34.0 pg Final  . MCHC 01/11/2017 33.0  30.0 - 36.0 g/dL Final  . RDW  01/11/2017 13.9  11.5 - 15.5 % Final  . Platelets 01/11/2017 243  150 - 400 K/uL Final  . Fecal Occult Bld 01/11/2017 NEGATIVE  NEGATIVE Final    No results found.   Assessment/Plan   ICD-10-CM   1. FTT (failure to thrive) in adult - likely 2/2 #3 R62.7   2. Weight loss - slow, progressive due to #1 R63.4   3. Chronic renal disease, stage 4, severely decreased glomerular filtration rate between 15-29 mL/min/1.73 square meter (HCC) N18.4   4. Essential hypertension, benign I10   5. Primary osteoarthritis involving multiple joints M15.0      Cont current meds as ordered  Cont nutritional supplements as ordered  PT/OT/ST as indicated  She refuses blood labs to be drawn  Will follow    S. Perlie Gold  [facility] [address] [place], [place] [address] [phone] Cell (Monday-Friday 8 AM - 5 PM) [phone] After 5 PM and follow prompts

## 2017-06-17 ENCOUNTER — Non-Acute Institutional Stay (SKILLED_NURSING_FACILITY): Payer: Medicare Other | Admitting: Adult Health

## 2017-06-17 ENCOUNTER — Encounter: Payer: Self-pay | Admitting: Adult Health

## 2017-06-17 DIAGNOSIS — M150 Primary generalized (osteo)arthritis: Secondary | ICD-10-CM

## 2017-06-17 DIAGNOSIS — N184 Chronic kidney disease, stage 4 (severe): Secondary | ICD-10-CM | POA: Diagnosis not present

## 2017-06-17 DIAGNOSIS — J302 Other seasonal allergic rhinitis: Secondary | ICD-10-CM

## 2017-06-17 DIAGNOSIS — I10 Essential (primary) hypertension: Secondary | ICD-10-CM

## 2017-06-17 DIAGNOSIS — K5901 Slow transit constipation: Secondary | ICD-10-CM

## 2017-06-17 DIAGNOSIS — M159 Polyosteoarthritis, unspecified: Secondary | ICD-10-CM

## 2017-06-17 NOTE — Progress Notes (Signed)
Location:   [name] Room Number: 228 B Place of Service:  SNF (31)   CODE STATUS: DNR  No Known Allergies  Chief Complaint  Patient presents with  . Medical Management of Chronic Issues    hypertension; osteoarthritis; constipation; stage IV renal disease; pud; allergic rhinitis     HPI:  She is a 83 year old long term resident of this facility being seen for the management of her chronic illnesses: hypertension; osteoarthritis; constipation; stage IV renal disease; pud and allergic rhinitis. She will not allow for lab work to be done; and will not allow for medication changes; will no take her nutritional supplements.  She is a poor historian but does tell me that she does not have joint pain; no headaches or blurred vision. There are no nursing concerns at this time.    Past Medical History:  Diagnosis Date  . Cholelithiasis   . Chronic pain   . Constipation   . Diabetes mellitus without complication (HCC)   . Essential hypertension, benign 01/13/2013  . Hyperlipidemia   . Hypertension   . Osteoarthritis   . PUD (peptic ulcer disease)     Past Surgical History:  Procedure Laterality Date  . ABDOMINAL HYSTERECTOMY    . CYSTOPLASTY    . right hip surgery      Social History   Social History  . Marital status: Widowed    Spouse name: N/A  . Number of children: N/A  . Years of education: N/A   Occupational History  . Not on file.   Social History Main Topics  . Smoking status: Never Smoker  . Smokeless tobacco: Never Used  . Alcohol use No  . Drug use: No  . Sexual activity: Not on file   Other Topics Concern  . Not on file   Social History Narrative  . No narrative on file   History reviewed. No pertinent family history.    VITAL SIGNS BP (!) 168/78   Pulse 68   Temp (!) 97.4 F (36.3 C)   Resp 17   Ht  (1.626 m)   Wt 164 lb (74.4 kg)   BMI 28.15 kg/m   Patient's Medications  New Prescriptions   No medications on file   Previous Medications   ACETAMINOPHEN (TYLENOL) 325 MG TABLET    Take 650 mg by mouth every 8 (eight) hours as needed for mild pain.    AMLODIPINE (NORVASC) 10 MG TABLET    Take 10 mg by mouth daily.   CARBOXYMETHYLCELLUL-GLYCERIN (REFRESH OPTIVE OP)    Place 2 drops into both eyes 3 (three) times daily.   GUAIFENESIN-DEXTROMETHORPHAN (ROBITUSSIN DM) 100-10 MG/5ML SYRUP    Take 20 mLs by mouth every 6 (six) hours as needed for cough.   MULTIPLE VITAMIN (MULTIVITAMIN WITH MINERALS) TABS TABLET    Take 1 tablet by mouth daily.   SENNOSIDES-DOCUSATE SODIUM (SENOKOT-S) 8.6-50 MG TABLET    Take 2 tablets by mouth daily. For constipation  Modified Medications   No medications on file  Discontinued Medications   No medications on file     SIGNIFICANT DIAGNOSTIC EXAMS  LABS REVIEWED: DECLINES LABS  08-30-13: wbc 7.2; hgb 9.3; hct 28.3; mcv 90.5; plt 225; gucose 101; bun 54; creat 2.31; k+4.7; na++138; liver normal albumin 3.7  12-03-14: UA: neg  01-11-17: wbc 7.9; hgb 9.4; hct 28.5; mcv 86.9; plt 243; glucose 152; bun 43; creat 2.12; k+ 4.0; na++ 140    Review of Systems  Reason  unable to perform ROS: poor historian   Constitutional: Negative for malaise/fatigue.  Respiratory: Negative for cough.   Cardiovascular: Negative for chest pain.  Gastrointestinal: Negative for constipation and heartburn.  Musculoskeletal: Negative for back pain and myalgias.  Skin: Negative.   Neurological: Negative for dizziness.  Psychiatric/Behavioral: The patient is not nervous/anxious.     Physical Exam  Constitutional: No distress.  Slightly over weight   Eyes: Conjunctivae are normal.  Neck: Neck supple. No JVD present. No thyromegaly present.  Cardiovascular: Normal rate, regular rhythm and intact distal pulses.   Respiratory: Effort normal and breath sounds normal. No respiratory distress. She has no wheezes.  GI: Soft. Bowel sounds are normal. She exhibits no distension. There is no tenderness.    Musculoskeletal: She exhibits no edema.  Able to move all extremities   Lymphadenopathy:    She has no cervical adenopathy.  Neurological: She is alert.  Skin: Skin is warm and dry. She is not diaphoretic.  Psychiatric: She has a normal mood and affect.    ASSESSMENT/ PLAN:  1. Hypertension: b/p 168/78  she is without change: will continue norvasc 10 mg daily;   She will not allow any blood work to be drawn  2. Osteoarthritis: she is stable will continue  tylenol 650 mg every 6 hours as needed   3. Constipation: stable  will continue senna s 2 tabs daily  4. Stage IV CKD: she will not allow for lab work to be done in order to monitor her status; will not make changes and will monitor her status   5. PUD: is stable  she is denying any complaints of GI distress; is presently not on medications. Will monitor   6. allergic rhinitis: is stable is off medications will monitor     Synthia Innocenteborah Mizuki Hoel NP [facility]  Contact [phone] Monday through Friday 8am- 5pm  After hours call [phone]

## 2017-07-16 ENCOUNTER — Encounter: Payer: Self-pay | Admitting: Adult Health

## 2017-07-16 ENCOUNTER — Non-Acute Institutional Stay (SKILLED_NURSING_FACILITY): Payer: Medicare Other | Admitting: Adult Health

## 2017-07-16 DIAGNOSIS — I10 Essential (primary) hypertension: Secondary | ICD-10-CM | POA: Diagnosis not present

## 2017-07-16 DIAGNOSIS — N184 Chronic kidney disease, stage 4 (severe): Secondary | ICD-10-CM | POA: Diagnosis not present

## 2017-07-16 DIAGNOSIS — M150 Primary generalized (osteo)arthritis: Secondary | ICD-10-CM | POA: Diagnosis not present

## 2017-07-16 DIAGNOSIS — K279 Peptic ulcer, site unspecified, unspecified as acute or chronic, without hemorrhage or perforation: Secondary | ICD-10-CM | POA: Diagnosis not present

## 2017-07-16 DIAGNOSIS — J302 Other seasonal allergic rhinitis: Secondary | ICD-10-CM

## 2017-07-16 DIAGNOSIS — K5909 Other constipation: Secondary | ICD-10-CM

## 2017-07-16 DIAGNOSIS — M159 Polyosteoarthritis, unspecified: Secondary | ICD-10-CM

## 2017-07-16 NOTE — Progress Notes (Signed)
Location:   [name] Room Number: 228 B Place of Service:  SNF (31)   CODE STATUS: DNR  No Known Allergies  Chief Complaint  Patient presents with  . Medical Management of Chronic Issues    Hypertension; osteoarthritis; CKD stage IV; pud; seasonal allergies    HPI:  She is a 83 year old long term resident of this facility being seen for the management of her chronic illnesses: hypertension; osteoarthritis; ckd stage IV: pud; seasonal allergies. She is unable to fully participate in the hpi or ros. There are no reports of behavioral issues; changes in appetite; or indications of pain. There are no nursing concerns at this time.  Past Medical History:  Diagnosis Date  . Cholelithiasis   . Chronic pain   . Constipation   . Diabetes mellitus without complication (HCC)   . Essential hypertension, benign 01/13/2013  . Hyperlipidemia   . Hypertension   . Osteoarthritis   . PUD (peptic ulcer disease)     Past Surgical History:  Procedure Laterality Date  . ABDOMINAL HYSTERECTOMY    . CYSTOPLASTY    . right hip surgery      Social History   Social History  . Marital status: Widowed    Spouse name: N/A  . Number of children: N/A  . Years of education: N/A   Occupational History  . Not on file.   Social History Main Topics  . Smoking status: Never Smoker  . Smokeless tobacco: Never Used  . Alcohol use No  . Drug use: No  . Sexual activity: Not on file   Other Topics Concern  . Not on file   Social History Narrative  . No narrative on file   History reviewed. No pertinent family history.    VITAL SIGNS BP 140/72   Pulse 70   Temp 98.4 F (36.9 C)   Resp 20   Ht  (1.626 m)   Wt 164 lb (74.4 kg)   SpO2 97%   BMI 28.15 kg/m   Patient's Medications  New Prescriptions   No medications on file  Previous Medications   ACETAMINOPHEN (TYLENOL) 325 MG TABLET    Take 650 mg by mouth every 8 (eight) hours as needed for mild pain.    AMLODIPINE (NORVASC) 10 MG TABLET    Take 10 mg by mouth daily.   CARBOXYMETHYLCELLUL-GLYCERIN (REFRESH OPTIVE OP)    Place 2 drops into both eyes 3 (three) times daily.   GUAIFENESIN-DEXTROMETHORPHAN (ROBITUSSIN DM) 100-10 MG/5ML SYRUP    Take 20 mLs by mouth every 6 (six) hours as needed for cough.   MULTIPLE VITAMIN (MULTIVITAMIN WITH MINERALS) TABS TABLET    Take 1 tablet by mouth daily.   SENNOSIDES-DOCUSATE SODIUM (SENOKOT-S) 8.6-50 MG TABLET    Take 2 tablets by mouth daily. For constipation  Modified Medications   No medications on file  Discontinued Medications   No medications on file     SIGNIFICANT DIAGNOSTIC EXAMS  LABS REVIEWED: DECLINES LABS  08-30-13: wbc 7.2; hgb 9.3; hct 28.3; mcv 90.5; plt 225; gucose 101; bun 54; creat 2.31; k+4.7; na++138; liver normal albumin 3.7  12-03-14: UA: neg  01-11-17: wbc 7.9; hgb 9.4; hct 28.5; mcv 86.9; plt 243; glucose 152; bun 43; creat 2.12; k+ 4.0; na++ 140   NO NEW LABS    Review of Systems  Unable to perform ROS: Other (poor historian )  Respiratory: Negative for cough.   Cardiovascular: Negative for chest pain.  Gastrointestinal: Negative  for abdominal pain.  Musculoskeletal: Negative for back pain.  Psychiatric/Behavioral: The patient is not nervous/anxious.     Physical Exam  Constitutional: No distress.  Neck: Neck supple. No thyromegaly present.  Cardiovascular: Normal rate, regular rhythm, normal heart sounds and intact distal pulses.   Pulmonary/Chest: Effort normal and breath sounds normal. No respiratory distress. She has no wheezes.  Abdominal: Soft. Bowel sounds are normal. She exhibits no distension. There is no tenderness.  Musculoskeletal: She exhibits no edema.  Able to move all extremities   Lymphadenopathy:    She has no cervical adenopathy.  Neurological: She is alert.  Skin: Skin is warm and dry. She is not diaphoretic.  Psychiatric: She has a normal mood and affect.       ASSESSMENT/  PLAN:   TODAY:   1. Essential benign hypertension: b/p 140/72  she is without change: will continue norvasc 10 mg daily;   She will not allow any blood work to be drawn  2. Osteoarthritis: she is stable will continue  tylenol 650 mg every 6 hours as needed   3. Chronic  constipation: stable  will continue senna s 2 tabs daily  4. Stage IV CKD: she will not allow for lab work to be done in order to monitor her status; will not make changes and will monitor her status   5. PUD: is stable  she is denying any complaints of GI distress; is presently not on medications. Will monitor   6. Seasonal allergies: is stable is off medications will monitor    MD is aware of resident's narcotic use and is in agreement with current plan of care. We will attempt to wean resident as apropriate     Synthia Innocent NP [facility]  Contact [phone] Monday through Friday 8am- 5pm  After hours call [phone]

## 2017-08-04 ENCOUNTER — Emergency Department (HOSPITAL_COMMUNITY): Payer: Medicare Other

## 2017-08-04 ENCOUNTER — Emergency Department (HOSPITAL_COMMUNITY)
Admission: EM | Admit: 2017-08-04 | Discharge: 2017-08-04 | Disposition: A | Discharge status: Home / Self Care | Payer: Medicare Other | Attending: Emergency Medicine | Admitting: Emergency Medicine

## 2017-08-04 ENCOUNTER — Encounter (HOSPITAL_COMMUNITY): Payer: Self-pay | Admitting: Internal Medicine

## 2017-08-04 DIAGNOSIS — I129 Hypertensive chronic kidney disease with stage 1 through stage 4 chronic kidney disease, or unspecified chronic kidney disease: Secondary | ICD-10-CM | POA: Diagnosis not present

## 2017-08-04 DIAGNOSIS — Y929 Unspecified place or not applicable: Secondary | ICD-10-CM | POA: Diagnosis not present

## 2017-08-04 DIAGNOSIS — N83202 Unspecified ovarian cyst, left side: Secondary | ICD-10-CM

## 2017-08-04 DIAGNOSIS — N184 Chronic kidney disease, stage 4 (severe): Secondary | ICD-10-CM | POA: Diagnosis not present

## 2017-08-04 DIAGNOSIS — Z79899 Other long term (current) drug therapy: Secondary | ICD-10-CM | POA: Diagnosis not present

## 2017-08-04 DIAGNOSIS — Y999 Unspecified external cause status: Secondary | ICD-10-CM | POA: Insufficient documentation

## 2017-08-04 DIAGNOSIS — S79912A Unspecified injury of left hip, initial encounter: Secondary | ICD-10-CM | POA: Diagnosis present

## 2017-08-04 DIAGNOSIS — W19XXXA Unspecified fall, initial encounter: Secondary | ICD-10-CM | POA: Insufficient documentation

## 2017-08-04 DIAGNOSIS — Y939 Activity, unspecified: Secondary | ICD-10-CM | POA: Diagnosis not present

## 2017-08-04 DIAGNOSIS — S82132A Displaced fracture of medial condyle of left tibia, initial encounter for closed fracture: Secondary | ICD-10-CM | POA: Diagnosis not present

## 2017-08-04 MED ORDER — ACETAMINOPHEN 325 MG PO TABS
650.0000 mg | ORAL_TABLET | Freq: Once | ORAL | Status: AC
  Administered 2017-08-04: 650 mg via ORAL
  Filled 2017-08-04: qty 2

## 2017-08-04 NOTE — ED Notes (Signed)
Pt returned to room from CT

## 2017-08-04 NOTE — ED Notes (Addendum)
Patient transported to CT 

## 2017-08-04 NOTE — ED Notes (Signed)
Ortho tech at bedside to apply knee immobilizer.  

## 2017-08-04 NOTE — ED Provider Notes (Signed)
[facility]-EMERGENCY DEPT Provider Note   CSN: [id number] Arrival date & time: 08/04/17  [id number]     History   Chief Complaint Chief Complaint  Patient presents with  . Fall    HPI Chelsea [name] is a 83 y.o. female.  Pt presents to the ED today with a fall and left hip and knee pain.  The pt fell last night.  She is a poor historian, so she is unable to give me any history about the fall.  She is in a SNF and they called EMS, but pt did not want to go to the ED.  The SNF had mobile xray come out last night who took xrays which were negative.  Pt continues to have pain this morning, so they sent her to the ED.        Past Medical History:  Diagnosis Date  . Cholelithiasis   . Chronic pain   . Constipation   . Diabetes mellitus without complication (HCC)   . Essential hypertension, benign 01/13/2013  . Hyperlipidemia   . Hypertension   . Osteoarthritis   . PUD (peptic ulcer disease)     Patient Active Problem List   Diagnosis Date Noted  . Seasonal allergies 09/07/2015  . Chronic renal disease, stage 4, severely decreased glomerular filtration rate between 15-29 mL/min/1.73 square meter (HCC) 11/19/2013  . Essential hypertension, benign 01/13/2013  . PUD (peptic ulcer disease) 01/13/2013  . Constipation 01/13/2013  . Osteoarthritis 01/13/2013    Past Surgical History:  Procedure Laterality Date  . ABDOMINAL HYSTERECTOMY    . CYSTOPLASTY    . right hip surgery      OB History    No data available       Home Medications    Prior to Admission medications   Medication Sig Start Date End Date Taking? Authorizing Provider  acetaminophen (TYLENOL) 325 MG tablet Take 650 mg by mouth every 8 (eight) hours as needed for mild pain.    Yes [provider]  amLODipine (NORVASC) 10 MG tablet Take 10 mg by mouth daily.   Yes [provider]  Carboxymethylcellul-Glycerin (REFRESH OPTIVE OP) Place 2 drops into both eyes 3 (three)  times daily.   Yes [provider]  guaiFENesin-dextromethorphan (ROBITUSSIN DM) 100-10 MG/5ML syrup Take 20 mLs by mouth every 6 (six) hours as needed for cough.   Yes [provider]  Multiple Vitamin (MULTIVITAMIN WITH MINERALS) TABS tablet Take 1 tablet by mouth daily.   Yes [provider]  sennosides-docusate sodium (SENOKOT-S) 8.6-50 MG tablet Take 2 tablets by mouth daily. For constipation   Yes [provider]    Family History History reviewed. No pertinent family history.  Social History Social History  Substance Use Topics  . Smoking status: Never Smoker  . Smokeless tobacco: Never Used  . Alcohol use No     Allergies   Patient has no known allergies.   Review of Systems Review of Systems  Musculoskeletal:       Left knee and left hip pain  All other systems reviewed and are negative.    Physical Exam Updated Vital Signs BP (!) 174/61   Pulse 72   Temp 98.8 F (37.1 C) (Oral)   Resp 13   SpO2 97%   Physical Exam  Constitutional: She appears well-developed and well-nourished.  HENT:  Head: Normocephalic and atraumatic.  Right Ear: External ear normal.  Left Ear: External ear normal.  Nose: Nose normal.  Mouth/Throat:  Oropharynx is clear and moist.  Eyes: Pupils are equal, round, and reactive to light. Conjunctivae and EOM are normal.  Neck: Normal range of motion.  Cardiovascular: Normal rate, regular rhythm, normal heart sounds and intact distal pulses.   Pulmonary/Chest: Effort normal and breath sounds normal.  Abdominal: Soft. Bowel sounds are normal.  Musculoskeletal:       Left hip: She exhibits bony tenderness.       Left knee: Tenderness found.  Pain with movement of left leg  Neurological: She is alert.  Skin: Skin is warm.  Psychiatric: She has a normal mood and affect. Her behavior is normal.  Nursing note and vitals reviewed.    ED Treatments / Results  Labs (all labs ordered are listed, but  only abnormal results are displayed) Labs Reviewed - No data to display  EKG  EKG Interpretation None       Radiology Ct Knee Left Wo Contrast  Result Date: 08/04/2017 CLINICAL DATA:  Patient fell last night. Left hip and left knee pain. EXAM: CT OF THE LEFT KNEE WITHOUT CONTRAST TECHNIQUE: Multidetector CT imaging of the left knee was performed according to the standard protocol. Multiplanar CT image reconstructions were also generated. COMPARISON:  Radiograph same date FINDINGS: Bones/Joint/Cartilage The bones are severely demineralized. No evidence of displaced fracture or dislocation. There is a possible nondisplaced linear fracture involving the posterior aspect of the medial tibial plateau, best seen on axial image 75 and sagittal images 42-45. There is no depression of the articular surface. This is situated in the coronal plane. There are tricompartmental degenerative changes which are most advanced within the medial compartment. There is apparent ossification of the posterior horn of the medial meniscus which may be partially incorporated into the posterior aspect of the medial tibial plateau. Moderate-sized intermediate density knee joint effusion without layering intra-articular fat. Ligaments Suboptimally assessed by CT. Muscles and Tendons Intact extensor mechanism.  No focal muscular abnormalities. Soft tissues Extensive femoral popliteal atherosclerosis.  Small Baker's cyst. IMPRESSION: 1. Possible nondisplaced intra-articular fracture of the medial tibial plateau posteriorly. Detail limited by severe osteopenia. 2. Moderate-sized joint effusion, probable hemarthrosis. 3. Advanced tricompartmental degenerative changes, most advanced in the medial compartment. Electronically Signed   By: Carey Bullocks M.D.   On: 08/04/2017 11:55   Ct Hip Left Wo Contrast  Result Date: 08/04/2017 CLINICAL DATA:  Patient fell last night. Left hip and left knee pain. EXAM: CT OF THE LEFT HIP WITHOUT  CONTRAST TECHNIQUE: Multidetector CT imaging of the left hip was performed according to the standard protocol. Multiplanar CT image reconstructions were also generated. COMPARISON:  Radiograph same date. Pelvic CT 03/26/2008. Pelvic ultrasound 05/24/2011. FINDINGS: Bones/Joint/Cartilage Examination limited to the left hip and inferior left hemipelvis. Status post left proximal femoral IM nail and dynamic screw fixation. The hardware is intact without loosening. There is posttraumatic deformity in the intertrochanteric region with surrounding heterotopic ossification. No evidence of acute fracture or dislocation. The femoral head appears intact. No significant left hip arthropathy or joint effusion. Ligaments Suboptimally assessed by CT. Muscles and Tendons Unremarkable. Soft tissues Mildly increased density in the subcutaneous fat inferior to the ischial tuberosity, possibly soft tissue contusion. There is no focal fluid collection. Iliofemoral atherosclerosis noted. The urinary bladder is distended. There is a partially visualized left adnexal water density lesion measuring 5.5 cm on image 3. This measured 2.7 cm maximally on prior CT and up to 4.3 cm on prior pelvic ultrasound. IMPRESSION: 1. Stable appearance of the proximal left  femur status post ORIF. No evidence of acute fracture or dislocation. 2. Progressive enlargement of known left adnexal cystic lesion. Given the patient's age, this could reflect cystic ovarian neoplasm. Follow-up pelvic ultrasound recommended. Electronically Signed   By: Carey Bullocks M.D.   On: 08/04/2017 11:46   Dg Knee Complete 4 Views Left  Result Date: 08/04/2017 CLINICAL DATA:  Pt fell in the bathroom last night when she got up to urinate. Pt complained of left knee and left hip pain. EMS was called but patient refused care last night. This morning, pt still complained of pain and the staff called EMS again. EXAM: LEFT KNEE - COMPLETE 4+ VIEW COMPARISON:  None. FINDINGS:  There is soft tissue swelling in the region of the left knee. Bones appear osteopenic. Positioning on the lateral view is nonstandard. However, joint effusion is suspected. There is no acute fracture or subluxation. Moderate degenerative changes are present, involving all compartments. There is dense atherosclerotic calcification of the femoral artery. IMPRESSION: Tricompartmental degenerative change. Suspect joint effusion. Electronically Signed   By: Norva Pavlov M.D.   On: 08/04/2017 10:48   Dg Hip Unilat W Or Wo Pelvis 2-3 Views Left  Result Date: 08/04/2017 CLINICAL DATA:  Pt fell in the bathroom last night when she got up to urinate. Pt complained of left knee and left hip pain. EMS was called but patient refused care last night. This morning, pt still complained of pain and the staff called EMS again. EXAM: DG HIP (WITH OR WITHOUT PELVIS) 2-3V LEFT COMPARISON:  10/21/2009 FINDINGS: Patient has had ORIF of the left hip. There is heterotopic bone around the greater trochanter. Bones appear radiolucent. Degenerative changes are seen in the lower lumbar spine. No acute fracture or subluxation identified. Degenerative changes are seen in both hips, right greater than left. Detail is quite limited given the osteopenia. Coarse calcification in the right hemipelvis compatible with fibroid. IMPRESSION: 1. Postoperative changes. 2.  No evidence for acute displaced fracture. 3. Study is limited due to osteopenia. Electronically Signed   By: Norva Pavlov M.D.   On: 08/04/2017 10:45    Procedures Procedures (including critical care time)  Medications Ordered in ED Medications  acetaminophen (TYLENOL) tablet 650 mg (not administered)     Initial Impression / Assessment and Plan / ED Course  I have reviewed the triage vital signs and the nursing notes.  Pertinent labs & imaging results that were available during my care of the patient were reviewed by me and considered in my medical decision  making (see chart for details).  Pt has evidence of left knee medial tibial plateau fx on CT.  Pt placed in a knee immobilizer.  She is at a SNF, so they can help her with her ADLs.  Pt to f/u with ortho.  Left adnexal lesion seen on CT concerning for ovarian cancer.  This cyst was seen on [place] in 2012.  Repeat recommended in 1 year, but I don't see that this was done.  No family here to discuss this with, but I did make a note on the d/c paperwork that a non-emergent pelvic US needs to be considered.  I am not sure if pt or family would want anything further done due to pt's age.   Final Clinical Impressions(s) / ED Diagnoses   Final diagnoses:  Closed fracture of medial portion of left tibial plateau, initial encounter  Cyst of left ovary    New Prescriptions New Prescriptions   No medications on  file     Jacalyn Lefevre, MD 08/04/17 1218

## 2017-08-04 NOTE — ED Notes (Signed)
Bed: [id number] Expected date: 08/04/17 Expected time: 9:04 AM Means of arrival: Ambulance Comments: Fall

## 2017-08-04 NOTE — ED Notes (Signed)
ED Provider at bedside. 

## 2017-08-04 NOTE — ED Notes (Signed)
Spoke with ortho tech. They will be up shortly to help put on knee immobilizer.

## 2017-08-04 NOTE — ED Notes (Signed)
Patient denies pain and is resting comfortably.  

## 2017-08-04 NOTE — Discharge Instructions (Signed)
Non-emergent pelvic ultrasound recommended to further evaluate the enlarging left cystic lesion seen on CT hip.  This is possibly an ovarian carcinoma due to pt's age.  Weight bearing as tolerated

## 2017-08-04 NOTE — ED Notes (Signed)
[facility] notified of need for transport. 

## 2017-08-04 NOTE — ED Triage Notes (Signed)
Pt fell in the bathroom last night when she got up to urinate. Pt complained of left knee and left hip pain. EMS was called but patient refused care last night. This morning, pt still complained of pain and the staff called EMS again.   Pt denies LOC and says that she did not her head. No contusions or lacerations noted.

## 2017-08-05 ENCOUNTER — Encounter: Payer: Self-pay | Admitting: Internal Medicine

## 2017-08-05 ENCOUNTER — Other Ambulatory Visit: Payer: Self-pay

## 2017-08-05 ENCOUNTER — Non-Acute Institutional Stay (SKILLED_NURSING_FACILITY): Payer: Medicare Other | Admitting: Internal Medicine

## 2017-08-05 DIAGNOSIS — N83202 Unspecified ovarian cyst, left side: Secondary | ICD-10-CM

## 2017-08-05 DIAGNOSIS — M25462 Effusion, left knee: Secondary | ICD-10-CM | POA: Diagnosis not present

## 2017-08-05 DIAGNOSIS — Z967 Presence of other bone and tendon implants: Secondary | ICD-10-CM | POA: Diagnosis not present

## 2017-08-05 DIAGNOSIS — Z9889 Other specified postprocedural states: Secondary | ICD-10-CM

## 2017-08-05 DIAGNOSIS — M8580 Other specified disorders of bone density and structure, unspecified site: Secondary | ICD-10-CM | POA: Diagnosis not present

## 2017-08-05 DIAGNOSIS — S82142A Displaced bicondylar fracture of left tibia, initial encounter for closed fracture: Secondary | ICD-10-CM | POA: Diagnosis not present

## 2017-08-05 DIAGNOSIS — Z8781 Personal history of (healed) traumatic fracture: Secondary | ICD-10-CM | POA: Diagnosis not present

## 2017-08-05 MED ORDER — TRAMADOL HCL 50 MG PO TABS: 50.0000 mg | ORAL_TABLET | Freq: Four times a day (QID) | ORAL | 0 refills | Status: DC | PRN

## 2017-08-05 NOTE — Progress Notes (Signed)
Patient ID: Chelsea [name], female   DOB: 1919-07-24, 83 y.o.   MRN: [id number]     DATE: August 05, 2017  Location:   [facility] Room Number: DISH of Service: SNF (31)   Extended Emergency Contact Information Primary Emergency Contact: Buzzell,[name] Address: [address]          [place], [place] Phone: [phone] Relation: Other  Advanced Directive information Does Patient Have a Medical Advance Directive?: Yes, Type of Advance Directive: Out of facility DNR (pink MOST or yellow form), Pre-existing out of facility DNR order (yellow form or pink MOST form): Pink MOST form placed in chart (order not valid for inpatient use), Does patient want to make changes to medical advance directive?: No - Patient declined  Chief Complaint  Patient presents with  . Acute Visit    ER Follow up    HPI:  83 yo female long term resident seen today for ED f/u. She fell in bathroom on 10/27th, c/o left knee/leg pain but refused to go to the ED for eval. xrays obtained at SNF was neg for acute fx. She continued to have pain on 10/28th and was taken to the ER. Left knee CT revealed possible nondisplaced intra-articular fx of medial tibial plateau posteriorly; severe osteopenia; moderate left joint effusion (possible hemiarthrosis); advanced tricompartmental DJD. CT left hip showed stable left femur ORIF; increasing left adnexal cystic lesion (5.5 cm). ED applied knee immobilizer and instructed to f/u with Ortho. She is a poor historian due to severe hearing loss. Hx obtained from chart. Pain is uncontrolled on Tylenol.  Reviewed chart - last pelvic US in 2012 showed left adnexal cyst likely benign; size 4.3 x 3.7 x 3.5 cm  Past Medical History:  Diagnosis Date  . Cholelithiasis   . Chronic pain   . Constipation   . Diabetes mellitus without complication ([place])   . Essential hypertension, benign 01/13/2013  . Hyperlipidemia   . Hypertension   . Osteoarthritis   . PUD (peptic  ulcer disease)     Past Surgical History:  Procedure Laterality Date  . ABDOMINAL HYSTERECTOMY    . CYSTOPLASTY    . right hip surgery      Patient Care Team: Gildardo Cranker, DO as PCP - General (Internal Medicine) Nyoka Cowden Phylis Bougie, NP as Nurse Practitioner (Nurse Practitioner) [facility] [place] [facility]  Social History   Social History  . Marital status: Widowed    Spouse name: N/A  . Number of children: N/A  . Years of education: N/A   Occupational History  . Not on file.   Social History Main Topics  . Smoking status: Never Smoker  . Smokeless tobacco: Never Used  . Alcohol use No  . Drug use: No  . Sexual activity: Not on file   Other Topics Concern  . Not on file   Social History Narrative  . No narrative on file     reports that she has never smoked. She has never used smokeless tobacco. She reports that she does not drink alcohol or use drugs.  History reviewed. No pertinent family history. No family status information on file.    Immunization History  Administered Date(s) Administered  . Influenza Whole 07/20/2013  . Influenza-Unspecified 07/19/2014, 07/15/2016  . PPD Test 05/30/2011, 06/29/2016, 07/06/2016  . Pneumococcal-Unspecified 05/17/2011    No Known Allergies  Medications: Patient's Medications  New Prescriptions   No medications on file  Previous Medications   ACETAMINOPHEN (  TYLENOL) 325 MG TABLET    Take 650 mg by mouth every 8 (eight) hours as needed for mild pain.    AMLODIPINE (NORVASC) 10 MG TABLET    Take 10 mg by mouth daily.   CARBOXYMETHYLCELLUL-GLYCERIN (REFRESH OPTIVE OP)    Place 2 drops into both eyes 3 (three) times daily.   GUAIFENESIN-DEXTROMETHORPHAN (ROBITUSSIN DM) 100-10 MG/5ML SYRUP    Take 20 mLs by mouth every 6 (six) hours as needed for cough.   MULTIPLE VITAMIN (MULTIVITAMIN WITH MINERALS) TABS TABLET    Take 1 tablet by mouth daily.   SENNOSIDES-DOCUSATE SODIUM (SENOKOT-S) 8.6-50 MG  TABLET    Take 2 tablets by mouth daily. For constipation   TRAMADOL (ULTRAM) 50 MG TABLET    Take 1 tablet (50 mg total) by mouth every 6 (six) hours as needed for moderate pain or severe pain.  Modified Medications   No medications on file  Discontinued Medications   No medications on file    Review of Systems  Unable to perform ROS: Other (severe hearing loss)    Vitals:   08/05/17 1142  BP: 128/78  Pulse: 64  Resp: 16  Temp: (!) 97.4 F (36.3 C)  SpO2: 97%  Weight: 165 lb 8 oz (75.1 kg)  Height: '5\' 4"'  (1.626 m)   Body mass index is 28.41 kg/m.  Physical Exam  Constitutional: She appears well-developed.  Frail appearing in NAD, lying in bed  HENT:  Mouth/Throat: Oropharynx is clear and moist. No oropharyngeal exudate.  MMM; no oral thrush  Eyes: Pupils are equal, round, and reactive to light. No scleral icterus.  Neck: Neck supple. Carotid bruit is not present. No tracheal deviation present. No thyromegaly present.  Cardiovascular: Normal rate, regular rhythm and intact distal pulses.  Exam reveals no gallop and no friction rub.   Murmur (1/6 SEM) heard. No LE edema b/l. no calf TTP.   Pulmonary/Chest: Effort normal and breath sounds normal. No stridor. No respiratory distress. She has no wheezes. She has no rales.  Abdominal: Soft. Normal appearance and bowel sounds are normal. She exhibits no distension and no mass. There is no hepatomegaly. There is no tenderness. There is no rigidity, no rebound and no guarding. No hernia.  Musculoskeletal: She exhibits edema and tenderness. She exhibits no deformity.  Left knee immobilizer intact; FROM toes  Lymphadenopathy:    She has no cervical adenopathy.  Neurological: She is alert.  Skin: Skin is warm and dry. No rash noted.  Psychiatric: She has a normal mood and affect. Her behavior is normal.     Labs reviewed: No visits with results within 3 Month(s) from this visit.  Latest known visit with results is:  Admission  on 01/11/2017, Discharged on 01/12/2017  Component Date Value Ref Range Status  . Sodium 01/11/2017 140  135 - 145 mmol/L Final  . Potassium 01/11/2017 4.0  3.5 - 5.1 mmol/L Final  . Chloride 01/11/2017 108  101 - 111 mmol/L Final  . CO2 01/11/2017 24  22 - 32 mmol/L Final  . Glucose, Bld 01/11/2017 152* 65 - 99 mg/dL Final  . BUN 01/11/2017 43* 6 - 20 mg/dL Final  . Creatinine, Ser 01/11/2017 2.12* 0.44 - 1.00 mg/dL Final  . Calcium 01/11/2017 9.4  8.9 - 10.3 mg/dL Final  . GFR calc non Af Amer 01/11/2017 19* >60 mL/min Final  . GFR calc Af Amer 01/11/2017 22* >60 mL/min Final   Comment: (NOTE) The eGFR has been calculated using the CKD EPI  equation. This calculation has not been validated in all clinical situations. eGFR's persistently <60 mL/min signify possible Chronic Kidney Disease.   . Anion gap 01/11/2017 8  5 - 15 Final  . Color, Urine 01/11/2017 YELLOW  YELLOW Final  . APPearance 01/11/2017 HAZY* CLEAR Final  . Specific Gravity, Urine 01/11/2017 1.011  1.005 - 1.030 Final  . pH 01/11/2017 6.0  5.0 - 8.0 Final  . Glucose, UA 01/11/2017 NEGATIVE  NEGATIVE mg/dL Final  . Hgb urine dipstick 01/11/2017 SMALL* NEGATIVE Final  . Bilirubin Urine 01/11/2017 NEGATIVE  NEGATIVE Final  . Ketones, ur 01/11/2017 NEGATIVE  NEGATIVE mg/dL Final  . Protein, ur 01/11/2017 100* NEGATIVE mg/dL Final  . Nitrite 01/11/2017 NEGATIVE  NEGATIVE Final  . Leukocytes, UA 01/11/2017 MODERATE* NEGATIVE Final  . RBC / HPF 01/11/2017 6-30  0 - 5 RBC/hpf Final  . WBC, UA 01/11/2017 TOO NUMEROUS TO COUNT  0 - 5 WBC/hpf Final  . Bacteria, UA 01/11/2017 MANY* NONE SEEN Final  . Squamous Epithelial / LPF 01/11/2017 0-5* NONE SEEN Final  . Mucus 01/11/2017 PRESENT   Final  . WBC 01/11/2017 7.9  4.0 - 10.5 K/uL Final  . RBC 01/11/2017 3.28* 3.87 - 5.11 MIL/uL Final  . Hemoglobin 01/11/2017 9.4* 12.0 - 15.0 g/dL Final  . HCT 01/11/2017 28.5* 36.0 - 46.0 % Final  . MCV 01/11/2017 86.9  78.0 - 100.0 fL Final    . MCH 01/11/2017 28.7  26.0 - 34.0 pg Final  . MCHC 01/11/2017 33.0  30.0 - 36.0 g/dL Final  . RDW 01/11/2017 13.9  11.5 - 15.5 % Final  . Platelets 01/11/2017 243  150 - 400 K/uL Final  . Fecal Occult Bld 01/11/2017 NEGATIVE  NEGATIVE Final    Ct Knee Left Wo Contrast  Result Date: 08/04/2017 CLINICAL DATA:  Patient fell last night. Left hip and left knee pain. EXAM: CT OF THE LEFT KNEE WITHOUT CONTRAST TECHNIQUE: Multidetector CT imaging of the left knee was performed according to the standard protocol. Multiplanar CT image reconstructions were also generated. COMPARISON:  Radiograph same date FINDINGS: Bones/Joint/Cartilage The bones are severely demineralized. No evidence of displaced fracture or dislocation. There is a possible nondisplaced linear fracture involving the posterior aspect of the medial tibial plateau, best seen on axial image 75 and sagittal images 42-45. There is no depression of the articular surface. This is situated in the coronal plane. There are tricompartmental degenerative changes which are most advanced within the medial compartment. There is apparent ossification of the posterior horn of the medial meniscus which may be partially incorporated into the posterior aspect of the medial tibial plateau. Moderate-sized intermediate density knee joint effusion without layering intra-articular fat. Ligaments Suboptimally assessed by CT. Muscles and Tendons Intact extensor mechanism.  No focal muscular abnormalities. Soft tissues Extensive femoral popliteal atherosclerosis.  Small Baker's cyst. IMPRESSION: 1. Possible nondisplaced intra-articular fracture of the medial tibial plateau posteriorly. Detail limited by severe osteopenia. 2. Moderate-sized joint effusion, probable hemarthrosis. 3. Advanced tricompartmental degenerative changes, most advanced in the medial compartment. Electronically Signed   By: Richardean Sale M.D.   On: 08/04/2017 11:55   Ct Hip Left Wo  Contrast  Result Date: 08/04/2017 CLINICAL DATA:  Patient fell last night. Left hip and left knee pain. EXAM: CT OF THE LEFT HIP WITHOUT CONTRAST TECHNIQUE: Multidetector CT imaging of the left hip was performed according to the standard protocol. Multiplanar CT image reconstructions were also generated. COMPARISON:  Radiograph same date. Pelvic CT 03/26/2008. Pelvic ultrasound  05/24/2011. FINDINGS: Bones/Joint/Cartilage Examination limited to the left hip and inferior left hemipelvis. Status post left proximal femoral IM nail and dynamic screw fixation. The hardware is intact without loosening. There is posttraumatic deformity in the intertrochanteric region with surrounding heterotopic ossification. No evidence of acute fracture or dislocation. The femoral head appears intact. No significant left hip arthropathy or joint effusion. Ligaments Suboptimally assessed by CT. Muscles and Tendons Unremarkable. Soft tissues Mildly increased density in the subcutaneous fat inferior to the ischial tuberosity, possibly soft tissue contusion. There is no focal fluid collection. Iliofemoral atherosclerosis noted. The urinary bladder is distended. There is a partially visualized left adnexal water density lesion measuring 5.5 cm on image 3. This measured 2.7 cm maximally on prior CT and up to 4.3 cm on prior pelvic ultrasound. IMPRESSION: 1. Stable appearance of the proximal left femur status post ORIF. No evidence of acute fracture or dislocation. 2. Progressive enlargement of known left adnexal cystic lesion. Given the patient's age, this could reflect cystic ovarian neoplasm. Follow-up pelvic ultrasound recommended. Electronically Signed   By: Richardean Sale M.D.   On: 08/04/2017 11:46   Dg Knee Complete 4 Views Left  Result Date: 08/04/2017 CLINICAL DATA:  Pt fell in the bathroom last night when she got up to urinate. Pt complained of left knee and left hip pain. EMS was called but patient refused care last night.  This morning, pt still complained of pain and the staff called EMS again. EXAM: LEFT KNEE - COMPLETE 4+ VIEW COMPARISON:  None. FINDINGS: There is soft tissue swelling in the region of the left knee. Bones appear osteopenic. Positioning on the lateral view is nonstandard. However, joint effusion is suspected. There is no acute fracture or subluxation. Moderate degenerative changes are present, involving all compartments. There is dense atherosclerotic calcification of the femoral artery. IMPRESSION: Tricompartmental degenerative change. Suspect joint effusion. Electronically Signed   By: Nolon Nations M.D.   On: 08/04/2017 10:48   Dg Hip Unilat W Or Wo Pelvis 2-3 Views Left  Result Date: 08/04/2017 CLINICAL DATA:  Pt fell in the bathroom last night when she got up to urinate. Pt complained of left knee and left hip pain. EMS was called but patient refused care last night. This morning, pt still complained of pain and the staff called EMS again. EXAM: DG HIP (WITH OR WITHOUT PELVIS) 2-3V LEFT COMPARISON:  10/21/2009 FINDINGS: Patient has had ORIF of the left hip. There is heterotopic bone around the greater trochanter. Bones appear radiolucent. Degenerative changes are seen in the lower lumbar spine. No acute fracture or subluxation identified. Degenerative changes are seen in both hips, right greater than left. Detail is quite limited given the osteopenia. Coarse calcification in the right hemipelvis compatible with fibroid. IMPRESSION: 1. Postoperative changes. 2.  No evidence for acute displaced fracture. 3. Study is limited due to osteopenia. Electronically Signed   By: Nolon Nations M.D.   On: 08/04/2017 10:45     Assessment/Plan   ICD-10-CM   1. Closed fracture of left tibial plateau, initial encounter S82.142A    medial per CT knee  2. S/P ORIF (open reduction internal fixation) fracture Z96.7    Z87.81    left femur  3. Osteopenia, unspecified location M85.80   4. Cyst of left ovary  N83.202   5. Effusion of left knee M25.462    2/2 fall with fx of tibial plateau    Start Tramadol 72m q6hrs prn mod-sev pain x 7 days  Cont other  meds as ordered  Refer to Ortho for left tibial plateau fx  Hold PT/OT until seen by Ortho  Fall precautions  Enlarging ovarian lesion likely benign cyst per previous US - will follow  Will follow  Shailey Butterbaugh S. Perlie Gold  [facility] and Adult Medicine [address] [place], [place] [address] [phone] Cell (Monday-Friday 8 AM - 5 PM) [phone] After 5 PM and follow prompts

## 2017-08-05 NOTE — Telephone Encounter (Signed)
RX faxed to [facility] @ [phone], phone number [phone] 

## 2017-08-07 ENCOUNTER — Encounter: Payer: Self-pay | Admitting: Adult Health

## 2017-08-07 NOTE — Progress Notes (Signed)
Entered in error

## 2017-08-08 ENCOUNTER — Non-Acute Institutional Stay (SKILLED_NURSING_FACILITY): Payer: Medicare Other | Admitting: Adult Health

## 2017-08-08 ENCOUNTER — Encounter: Payer: Self-pay | Admitting: Adult Health

## 2017-08-08 DIAGNOSIS — N184 Chronic kidney disease, stage 4 (severe): Secondary | ICD-10-CM | POA: Diagnosis not present

## 2017-08-08 DIAGNOSIS — I10 Essential (primary) hypertension: Secondary | ICD-10-CM | POA: Diagnosis not present

## 2017-08-08 DIAGNOSIS — F0150 Vascular dementia without behavioral disturbance: Secondary | ICD-10-CM | POA: Diagnosis not present

## 2017-08-08 NOTE — Progress Notes (Signed)
Location:   [name] Room Number: 228 B Place of Service:  SNF (31)   CODE STATUS: DNR  No Known Allergies  Chief Complaint  Patient presents with  . Acute Visit    Care Plan Meeting    HPI:  We have met with family and care plan team for her routine care plan meeting. We have reviewed the care plan. The nursing concerns were addressed. We dicussed her medical condition. She is not wearing her immobilizer. She will not allow for blood work to be drawn. There are no nursing concerns at this time.    Past Medical History:  Diagnosis Date  . Cholelithiasis   . Chronic pain   . Constipation   . Diabetes mellitus without complication ([facility])   . Essential hypertension, benign 01/13/2013  . Hyperlipidemia   . Hypertension   . Osteoarthritis   . PUD (peptic ulcer disease)     Past Surgical History:  Procedure Laterality Date  . ABDOMINAL HYSTERECTOMY    . CYSTOPLASTY    . right hip surgery      Social History   Social History  . Marital status: Widowed    Spouse name: N/A  . Number of children: N/A  . Years of education: N/A   Occupational History  . Not on file.   Social History Main Topics  . Smoking status: Never Smoker  . Smokeless tobacco: Never Used  . Alcohol use No  . Drug use: No  . Sexual activity: Not on file   Other Topics Concern  . Not on file   Social History Narrative  . No narrative on file   History reviewed. No pertinent family history.    VITAL SIGNS BP 128/78   Pulse 64   Temp (!) 97.4 F (36.3 C)   Resp 16   Ht 5' 4" (1.626 m)   Wt 168 lb 3.2 oz (76.3 kg)   SpO2 97%   BMI 28.87 kg/m   Patient's Medications  New Prescriptions   No medications on file  Previous Medications   ACETAMINOPHEN (TYLENOL) 325 MG TABLET    Take 650 mg by mouth every 8 (eight) hours as needed for mild pain.    AMLODIPINE (NORVASC) 10 MG TABLET    Take 10 mg by mouth daily.   CARBOXYMETHYLCELLUL-GLYCERIN (REFRESH OPTIVE OP)    Place  2 drops into both eyes 3 (three) times daily.   GUAIFENESIN-DEXTROMETHORPHAN (ROBITUSSIN DM) 100-10 MG/5ML SYRUP    Take 20 mLs by mouth every 6 (six) hours as needed for cough.   MULTIPLE VITAMIN (MULTIVITAMIN WITH MINERALS) TABS TABLET    Take 1 tablet by mouth daily.   SENNOSIDES-DOCUSATE SODIUM (SENOKOT-S) 8.6-50 MG TABLET    Take 2 tablets by mouth daily. For constipation   TRAMADOL (ULTRAM) 50 MG TABLET    Take 1 tablet (50 mg total) by mouth every 6 (six) hours as needed for moderate pain or severe pain.  Modified Medications   No medications on file  Discontinued Medications   No medications on file     SIGNIFICANT DIAGNOSTIC EXAMS  TODAY:   08-04-17: left hip x-ray: 1. Postoperative changes. 2.  No evidence for acute displaced fracture. 3. Study is limited due to osteopenia.  08-04-17: left knee x-ray: Tricompartmental degenerative change. Suspect joint effusion.  08-04-17: ct of left hip:  1. Stable appearance of the proximal left femur status post ORIF. No evidence of acute fracture or dislocation. 2. Progressive enlargement of known left adnexal   cystic lesion. Given the patient's age, this could reflect cystic ovarian neoplasm.  08-04-17: ct of left knee:  1. Possible nondisplaced intra-articular fracture of the medial tibial plateau posteriorly. Detail limited by severe osteopenia. 2. Moderate-sized joint effusion, probable hemarthrosis. 3. Advanced tricompartmental degenerative changes, most advanced in the medial compartment.    LABS REVIEWED: DECLINES LABS  08-30-13: wbc 7.2; hgb 9.3; hct 28.3; mcv 90.5; plt 225; gucose 101; bun 54; creat 2.31; k+4.7; na++138; liver normal albumin 3.7  12-03-14: UA: neg  01-11-17: wbc 7.9; hgb 9.4; hct 28.5; mcv 86.9; plt 243; glucose 152; bun 43; creat 2.12; k+ 4.0; na++ 140   NO NEW LABS    Review of Systems  Unable to perform ROS: Other (unable to answer questions )    Physical Exam  Constitutional: No distress.  Eyes:  Conjunctivae are normal.  Neck: Neck supple. No thyromegaly present.  Cardiovascular: Normal rate and regular rhythm.  Murmur heard. 1/6  Pulmonary/Chest: Effort normal and breath sounds normal. No respiratory distress.  Abdominal: Soft. Bowel sounds are normal. She exhibits no distension. There is no tenderness.  Musculoskeletal: She exhibits no edema.  Is able to move all extremities Knee immobilizer is off; she will not wear   Lymphadenopathy:    She has no cervical adenopathy.  Neurological: She is alert.  Skin: Skin is warm and dry. She is not diaphoretic.  Psychiatric: She has a normal mood and affect.   ASSESSMENT/ PLAN:  TODAY:   1. Dementia 2. Chronic renal disease stage 4 3. Hypertension  Will continue her current plan of care Her MOST form reviewed and discussed no changes have been made Her family does not want aggressive care.   Time spent with patient; family; care plan team: 40 minutes: discussed medical status; medications; her falls; and MOST form; has verbalized understanding.    MD is aware of resident's narcotic use and is in agreement with current plan of care. We will attempt to wean resident as apropriate     Ok Edwards NP [facility]  Contact [phone] Monday through Friday 8am- 5pm  After hours call [phone]

## 2017-08-13 ENCOUNTER — Non-Acute Institutional Stay (SKILLED_NURSING_FACILITY): Payer: Medicare Other | Admitting: Adult Health

## 2017-08-13 ENCOUNTER — Encounter: Payer: Self-pay | Admitting: Adult Health

## 2017-08-13 DIAGNOSIS — K5909 Other constipation: Secondary | ICD-10-CM

## 2017-08-13 DIAGNOSIS — F0150 Vascular dementia without behavioral disturbance: Secondary | ICD-10-CM

## 2017-08-13 DIAGNOSIS — I10 Essential (primary) hypertension: Secondary | ICD-10-CM | POA: Diagnosis not present

## 2017-08-13 DIAGNOSIS — K279 Peptic ulcer, site unspecified, unspecified as acute or chronic, without hemorrhage or perforation: Secondary | ICD-10-CM | POA: Diagnosis not present

## 2017-08-13 DIAGNOSIS — N184 Chronic kidney disease, stage 4 (severe): Secondary | ICD-10-CM | POA: Diagnosis not present

## 2017-08-13 NOTE — Progress Notes (Signed)
Location:   [name] Room Number: 228 B Place of Service:  SNF (31)   CODE STATUS: DNR  No Known Allergies  Chief Complaint  Patient presents with  . Medical Management of Chronic Issues    Hypertension; osteoarthritis; chronic constipation; stage IV renal disease; PUD allergic rhinitis.     HPI:  She is a 83 year old long term resident of this facility being seen for the management of her chronic illnesses: essential benign hypertension; chronic constipation; pud; vascular dementia without behavioral disturbance; osteoarthritis; chronic renal disease stage 4; seasonal allergies. She is unable to participate in the hpi or ros. There are no reports of any changes in appetite; no complaints of pain present; no issues with behaviors. There are no nursing concerns at this time.   Past Medical History:  Diagnosis Date  . Cholelithiasis   . Chronic pain   . Constipation   . Diabetes mellitus without complication (HCC)   . Essential hypertension, benign 01/13/2013  . Hyperlipidemia   . Hypertension   . Osteoarthritis   . PUD (peptic ulcer disease)     Past Surgical History:  Procedure Laterality Date  . ABDOMINAL HYSTERECTOMY    . CYSTOPLASTY    . right hip surgery      Social History   Socioeconomic History  . Marital status: Widowed    Spouse name: Not on file  . Number of children: Not on file  . Years of education: Not on file  . Highest education level: Not on file  Social Needs  . Financial resource strain: Not on file  . Food insecurity - worry: Not on file  . Food insecurity - inability: Not on file  . Transportation needs - medical: Not on file  . Transportation needs - non-medical: Not on file  Occupational History  . Not on file  Tobacco Use  . Smoking status: Never Smoker  . Smokeless tobacco: Never Used  Substance and Sexual Activity  . Alcohol use: No  . Drug use: No  . Sexual activity: Not on file  Other Topics Concern  . Not on  file  Social History Narrative  . Not on file   History reviewed. No pertinent family history.    VITAL SIGNS Pulse 76   Temp 97.9 F (36.6 C)   Ht 5\' 4"  (1.626 m)   Wt 168 lb 3.2 oz (76.3 kg)   BMI 28.87 kg/m     Medication List        Accurate as of 08/13/17  3:42 PM. Always use your most recent med list.          acetaminophen 325 MG tablet Commonly known as:  TYLENOL   amLODipine 10 MG tablet Commonly known as:  NORVASC   guaiFENesin-dextromethorphan 100-10 MG/5ML syrup Commonly known as:  ROBITUSSIN DM   multivitamin with minerals Tabs tablet   REFRESH OPTIVE OP   sennosides-docusate sodium 8.6-50 MG tablet Commonly known as:  SENOKOT-S        SIGNIFICANT DIAGNOSTIC EXAMS  PREVIOUS:   08-04-17: left hip x-ray: 1. Postoperative changes. 2.  No evidence for acute displaced fracture. 3. Study is limited due to osteopenia.  08-04-17: left knee x-ray: Tricompartmental degenerative change. Suspect joint effusion.  08-04-17: ct of left hip:  1. Stable appearance of the proximal left femur status post ORIF. No evidence of acute fracture or dislocation. 2. Progressive enlargement of known left adnexal cystic lesion. Given the patient's age, this could reflect cystic ovarian neoplasm.  08-04-17: ct of left knee:  1. Possible nondisplaced intra-articular fracture of the medial tibial plateau posteriorly. Detail limited by severe osteopenia. 2. Moderate-sized joint effusion, probable hemarthrosis. 3. Advanced tricompartmental degenerative changes, most advanced in the medial compartment.  NO NEW EXAMS     LABS REVIEWED: DECLINES LABS  08-30-13: wbc 7.2; hgb 9.3; hct 28.3; mcv 90.5; plt 225; gucose 101; bun 54; creat 2.31; k+4.7; na++138; liver normal albumin 3.7  12-03-14: UA: neg  01-11-17: wbc 7.9; hgb 9.4; hct 28.5; mcv 86.9; plt 243; glucose 152; bun 43; creat 2.12; k+ 4.0; na++ 140   NO NEW LABS    Review of Systems  Unable to perform ROS:  Dementia (unable to answer questions )   Physical Exam  Constitutional: No distress.  Thin   Neck: Neck supple. No thyromegaly present.  Cardiovascular: Normal rate, regular rhythm and intact distal pulses.  Murmur heard. 1/6  Pulmonary/Chest: Effort normal and breath sounds normal. No respiratory distress.  Abdominal: Soft. Bowel sounds are normal. She exhibits no distension. There is no tenderness.  Musculoskeletal: She exhibits no edema.  Is able to moe all extremities Has declined to use left leg immobilizer   Lymphadenopathy:    She has no cervical adenopathy.  Neurological: She is alert.  Skin: Skin is warm and dry. She is not diaphoretic.     ASSESSMENT/ PLAN:  TODAY:   1. Essential benign hypertension: b/p 140/92  she is without change: will continue norvasc 10 mg daily;   She will not allow any blood work to be drawn  2. Osteoarthritis: she is stable will continue  tylenol 650 mg every 6 hours as needed   3. Chronic  constipation: stable  will continue senna s 2 tabs daily  4. Stage IV CKD: she will not allow for lab work to be done in order to monitor her status; will not make changes and will monitor her status   5. PUD: is stable  she is denying any complaints of GI distress; is presently not on medications. Will monitor   6. Seasonal allergies: is stable is off medications will monitor  7. Vascular dementia: without change in status; is presently not on medications; will not make changes will monitor     MD is aware of resident's narcotic use and is in agreement with current plan of care. We will attempt to wean resident as apropriate   Synthia Innocent NP [facility]  Contact [phone] Monday through Friday 8am- 5pm  After hours call [phone]

## 2017-08-18 DIAGNOSIS — M8580 Other specified disorders of bone density and structure, unspecified site: Secondary | ICD-10-CM | POA: Insufficient documentation

## 2017-08-18 DIAGNOSIS — N83202 Unspecified ovarian cyst, left side: Secondary | ICD-10-CM | POA: Insufficient documentation

## 2017-08-29 DIAGNOSIS — F0150 Vascular dementia without behavioral disturbance: Secondary | ICD-10-CM | POA: Insufficient documentation

## 2017-09-07 ENCOUNTER — Other Ambulatory Visit: Payer: Self-pay

## 2017-09-07 ENCOUNTER — Emergency Department (HOSPITAL_COMMUNITY): Payer: Medicare Other

## 2017-09-07 ENCOUNTER — Inpatient Hospital Stay (HOSPITAL_COMMUNITY)
Admission: EM | Admit: 2017-09-07 | Discharge: 2017-09-11 | DRG: 682 | Payer: Medicare Other | Attending: Internal Medicine | Admitting: Internal Medicine

## 2017-09-07 ENCOUNTER — Encounter (HOSPITAL_COMMUNITY): Payer: Self-pay | Admitting: *Deleted

## 2017-09-07 DIAGNOSIS — E875 Hyperkalemia: Secondary | ICD-10-CM | POA: Diagnosis present

## 2017-09-07 DIAGNOSIS — E860 Dehydration: Secondary | ICD-10-CM | POA: Diagnosis present

## 2017-09-07 DIAGNOSIS — M8580 Other specified disorders of bone density and structure, unspecified site: Secondary | ICD-10-CM | POA: Diagnosis present

## 2017-09-07 DIAGNOSIS — R4020 Unspecified coma: Secondary | ICD-10-CM | POA: Diagnosis present

## 2017-09-07 DIAGNOSIS — N184 Chronic kidney disease, stage 4 (severe): Secondary | ICD-10-CM | POA: Diagnosis present

## 2017-09-07 DIAGNOSIS — R402132 Coma scale, eyes open, to sound, at arrival to emergency department: Secondary | ICD-10-CM | POA: Diagnosis present

## 2017-09-07 DIAGNOSIS — N179 Acute kidney failure, unspecified: Principal | ICD-10-CM | POA: Diagnosis present

## 2017-09-07 DIAGNOSIS — I1310 Hypertensive heart and chronic kidney disease without heart failure, with stage 1 through stage 4 chronic kidney disease, or unspecified chronic kidney disease: Secondary | ICD-10-CM | POA: Diagnosis present

## 2017-09-07 DIAGNOSIS — Z8711 Personal history of peptic ulcer disease: Secondary | ICD-10-CM | POA: Diagnosis not present

## 2017-09-07 DIAGNOSIS — N3001 Acute cystitis with hematuria: Secondary | ICD-10-CM | POA: Diagnosis present

## 2017-09-07 DIAGNOSIS — R310 Gross hematuria: Secondary | ICD-10-CM | POA: Diagnosis not present

## 2017-09-07 DIAGNOSIS — F0150 Vascular dementia without behavioral disturbance: Secondary | ICD-10-CM | POA: Diagnosis present

## 2017-09-07 DIAGNOSIS — Z90710 Acquired absence of both cervix and uterus: Secondary | ICD-10-CM | POA: Diagnosis not present

## 2017-09-07 DIAGNOSIS — G92 Toxic encephalopathy: Secondary | ICD-10-CM

## 2017-09-07 DIAGNOSIS — Z66 Do not resuscitate: Secondary | ICD-10-CM | POA: Diagnosis present

## 2017-09-07 DIAGNOSIS — K8020 Calculus of gallbladder without cholecystitis without obstruction: Secondary | ICD-10-CM | POA: Diagnosis present

## 2017-09-07 DIAGNOSIS — I471 Supraventricular tachycardia, unspecified: Secondary | ICD-10-CM | POA: Diagnosis present

## 2017-09-07 DIAGNOSIS — R402222 Coma scale, best verbal response, incomprehensible words, at arrival to emergency department: Secondary | ICD-10-CM | POA: Diagnosis present

## 2017-09-07 DIAGNOSIS — E872 Acidosis, unspecified: Secondary | ICD-10-CM | POA: Diagnosis present

## 2017-09-07 DIAGNOSIS — I248 Other forms of acute ischemic heart disease: Secondary | ICD-10-CM | POA: Diagnosis present

## 2017-09-07 DIAGNOSIS — Z515 Encounter for palliative care: Secondary | ICD-10-CM | POA: Diagnosis present

## 2017-09-07 DIAGNOSIS — G9341 Metabolic encephalopathy: Secondary | ICD-10-CM | POA: Diagnosis present

## 2017-09-07 DIAGNOSIS — E870 Hyperosmolality and hypernatremia: Secondary | ICD-10-CM | POA: Diagnosis present

## 2017-09-07 DIAGNOSIS — E1122 Type 2 diabetes mellitus with diabetic chronic kidney disease: Secondary | ICD-10-CM | POA: Diagnosis present

## 2017-09-07 DIAGNOSIS — G928 Other toxic encephalopathy: Secondary | ICD-10-CM

## 2017-09-07 DIAGNOSIS — R7989 Other specified abnormal findings of blood chemistry: Secondary | ICD-10-CM

## 2017-09-07 DIAGNOSIS — R778 Other specified abnormalities of plasma proteins: Secondary | ICD-10-CM

## 2017-09-07 DIAGNOSIS — R402332 Coma scale, best motor response, abnormal, at arrival to emergency department: Secondary | ICD-10-CM | POA: Diagnosis present

## 2017-09-07 DIAGNOSIS — N83202 Unspecified ovarian cyst, left side: Secondary | ICD-10-CM | POA: Diagnosis present

## 2017-09-07 DIAGNOSIS — R339 Retention of urine, unspecified: Secondary | ICD-10-CM | POA: Diagnosis not present

## 2017-09-07 DIAGNOSIS — Z7189 Other specified counseling: Secondary | ICD-10-CM | POA: Diagnosis not present

## 2017-09-07 DIAGNOSIS — N170 Acute kidney failure with tubular necrosis: Secondary | ICD-10-CM | POA: Diagnosis not present

## 2017-09-07 DIAGNOSIS — N3000 Acute cystitis without hematuria: Secondary | ICD-10-CM

## 2017-09-07 LAB — CBC
HCT: 40.8 % (ref 36.0–46.0)
Hemoglobin: 12.7 g/dL (ref 12.0–15.0)
MCH: 28.5 pg (ref 26.0–34.0)
MCHC: 31.1 g/dL (ref 30.0–36.0)
MCV: 91.7 fL (ref 78.0–100.0)
Platelets: 308 10*3/uL (ref 150–400)
RBC: 4.45 MIL/uL (ref 3.87–5.11)
RDW: 15.4 % (ref 11.5–15.5)
WBC: 11.6 10*3/uL — ABNORMAL HIGH (ref 4.0–10.5)

## 2017-09-07 LAB — BASIC METABOLIC PANEL
Anion gap: 14 (ref 5–15)
BUN: 194 mg/dL — AB (ref 6–20)
CALCIUM: 9.9 mg/dL (ref 8.9–10.3)
CHLORIDE: 123 mmol/L — AB (ref 101–111)
CO2: 16 mmol/L — AB (ref 22–32)
CREATININE: 4.56 mg/dL — AB (ref 0.44–1.00)
GFR calc Af Amer: 9 mL/min — ABNORMAL LOW (ref >60)
GFR calc non Af Amer: 7 mL/min — ABNORMAL LOW (ref >60)
Glucose, Bld: 190 mg/dL — ABNORMAL HIGH (ref 65–99)
Potassium: 5.9 mmol/L — ABNORMAL HIGH (ref 3.5–5.1)
SODIUM: 153 mmol/L — AB (ref 135–145)

## 2017-09-07 LAB — I-STAT TROPONIN, ED: TROPONIN I, POC: 0.69 ng/mL — AB (ref 0.00–0.08)

## 2017-09-07 LAB — CBC WITH DIFFERENTIAL/PLATELET
BASOS PCT: 0 %
Basophils Absolute: 0 10*3/uL (ref 0.0–0.1)
EOS ABS: 0 10*3/uL (ref 0.0–0.7)
Eosinophils Relative: 0 %
HCT: 41 % (ref 36.0–46.0)
Hemoglobin: 12.6 g/dL (ref 12.0–15.0)
LYMPHS ABS: 0.7 10*3/uL (ref 0.7–4.0)
Lymphocytes Relative: 6 %
MCH: 28.2 pg (ref 26.0–34.0)
MCHC: 30.7 g/dL (ref 30.0–36.0)
MCV: 91.7 fL (ref 78.0–100.0)
MONO ABS: 0.5 10*3/uL (ref 0.1–1.0)
MONOS PCT: 4 %
Neutro Abs: 10.2 10*3/uL — ABNORMAL HIGH (ref 1.7–7.7)
Neutrophils Relative %: 90 %
Platelets: 309 10*3/uL (ref 150–400)
RBC: 4.47 MIL/uL (ref 3.87–5.11)
RDW: 15.4 % (ref 11.5–15.5)
WBC: 11.4 10*3/uL — ABNORMAL HIGH (ref 4.0–10.5)

## 2017-09-07 LAB — I-STAT CG4 LACTIC ACID, ED
Lactic Acid, Venous: 1.77 mmol/L (ref 0.5–1.9)
Lactic Acid, Venous: 1.49 mmol/L (ref 0.5–1.9)

## 2017-09-07 LAB — URINALYSIS, ROUTINE W REFLEX MICROSCOPIC
BILIRUBIN URINE: NEGATIVE
Glucose, UA: NEGATIVE mg/dL
NITRITE: NEGATIVE
PH: 8.5 — AB (ref 5.0–8.0)
Protein, ur: 100 mg/dL — AB
SPECIFIC GRAVITY, URINE: 1.015 (ref 1.005–1.030)

## 2017-09-07 LAB — HEPATIC FUNCTION PANEL
ALBUMIN: 3.7 g/dL (ref 3.5–5.0)
ALK PHOS: 113 U/L (ref 38–126)
ALT: 13 U/L — ABNORMAL LOW (ref 14–54)
AST: 26 U/L (ref 15–41)
BILIRUBIN INDIRECT: 0.3 mg/dL (ref 0.3–0.9)
BILIRUBIN TOTAL: 0.4 mg/dL (ref 0.3–1.2)
Bilirubin, Direct: 0.1 mg/dL (ref 0.1–0.5)
TOTAL PROTEIN: 7.7 g/dL (ref 6.5–8.1)

## 2017-09-07 LAB — URINALYSIS, MICROSCOPIC (REFLEX): Squamous Epithelial / LPF: NONE SEEN

## 2017-09-07 LAB — LACTIC ACID, PLASMA: Lactic Acid, Venous: 1.3 mmol/L (ref 0.5–1.9)

## 2017-09-07 MED ORDER — DEXTROSE 5 % IV SOLN
INTRAVENOUS | Status: AC
  Administered 2017-09-07 – 2017-09-08 (×2): via INTRAVENOUS

## 2017-09-07 MED ORDER — SODIUM CHLORIDE 0.9 % IV BOLUS (SEPSIS)
1000.0000 mL | Freq: Once | INTRAVENOUS | Status: AC
  Administered 2017-09-07: 1000 mL via INTRAVENOUS

## 2017-09-07 MED ORDER — SODIUM CHLORIDE 0.45 % IV BOLUS: 1000.0000 mL | Freq: Once | INTRAVENOUS | Status: DC

## 2017-09-07 MED ORDER — METOPROLOL TARTRATE 5 MG/5ML IV SOLN
5.0000 mg | Freq: Four times a day (QID) | INTRAVENOUS | Status: DC | PRN
  Administered 2017-09-07 – 2017-09-09 (×2): 5 mg via INTRAVENOUS
  Filled 2017-09-07 (×2): qty 5

## 2017-09-07 MED ORDER — DEXTROSE 5 % IV SOLN
2.0000 g | Freq: Once | INTRAVENOUS | Status: AC
  Administered 2017-09-07: 2 g via INTRAVENOUS
  Filled 2017-09-07: qty 2

## 2017-09-07 MED ORDER — METOPROLOL TARTRATE 5 MG/5ML IV SOLN
2.5000 mg | Freq: Once | INTRAVENOUS | Status: AC
  Administered 2017-09-07: 2.5 mg via INTRAVENOUS
  Filled 2017-09-07: qty 5

## 2017-09-07 NOTE — ED Triage Notes (Signed)
EMS [facility], fx rt femur a couple of weeks ago, pt was ok at 0700, upon arrival of EMS pt tachy 150, responding only to painful stimuli, IV #18 rt FA, 400 bolus given, Pt has MOST form. Unclear as to what happened between 0700 and present.

## 2017-09-07 NOTE — Assessment & Plan Note (Addendum)
She presents with a MOST form signed 03/29/2016 by herself and her doctor that indicates her wishes as:  - DNR status - Do not send to hospital except for comfort measures that can not be administered in the facility - IV antibiotic administration is acceptable - IVF administration is acceptable - No feeding tube Plan: Honor MOST plan. Palliative care consult.

## 2017-09-07 NOTE — H&P (Signed)
Chelsea [name] is an 83 y.o. female.   Chief Complaint: altered mental status HPI:  The patient is a 83 yo woman who lives in a skilled facility, [name], who presents today with altered mental status. The patient is somnolent to obtunded and is not speaking, so history is obtained from the records and the emergency department staff. Had a recent tib fracture November 2018 also. Regarding the altered mental status: Onset: Today. Duration: constant.  Location: generalized. Character: Not speaking, not alert. Alleviated by: Nothing. Exacerbated by: Nothing. Associated Symptoms: Does have blood in urine.  Treatments: none at home except usual medications. Has been receiving Ertapenem 500 mg IM daily for a urinary tract infection x 10 days, to be completed on 09/11/17.  She presents with a MOST form signed 03/29/2016 by herself and her doctor that indicates her wishes as:  - DNR status - Do not send to hospital except for comfort measures that can not be administered in the facility - IV antibiotic administration is acceptable - IVF administration is acceptable - No feeding tube    Past Medical History:  Diagnosis Date  . Cholelithiasis   . Chronic pain   . Constipation   . Diabetes mellitus without complication ([place])   . Essential hypertension, benign 01/13/2013  . Hyperlipidemia   . Hypertension   . Osteoarthritis   . PUD (peptic ulcer disease)     Past Surgical History:  Procedure Laterality Date  . ABDOMINAL HYSTERECTOMY    . CYSTOPLASTY    . right hip surgery     Patient is unable to provide family history because she is obtunded.  No family history on file. Social History:  reports that  has never smoked. she has never used smokeless tobacco. She reports that she does not drink alcohol or use drugs.  Allergies: No Known Allergies   (Not in a hospital admission)  Results for orders placed or performed during the hospital encounter of 09/07/17 (from the past 48  hour(s))  I-stat troponin, ED     Status: Abnormal   Collection Time: 09/07/17  2:06 PM  Result Value Ref Range   Troponin i, poc 0.69 (HH) 0.00 - 0.08 ng/mL   Comment NOTIFIED PHYSICIAN    Comment 3            Comment: Due to the release kinetics of cTnI, a negative result within the first hours of the onset of symptoms does not rule out myocardial infarction with certainty. If myocardial infarction is still suspected, repeat the test at appropriate intervals.   Basic metabolic panel     Status: Abnormal   Collection Time: 09/07/17  2:07 PM  Result Value Ref Range   Sodium 153 (H) 135 - 145 mmol/L   Potassium 5.9 (H) 3.5 - 5.1 mmol/L   Chloride 123 (H) 101 - 111 mmol/L   CO2 16 (L) 22 - 32 mmol/L   Glucose, Bld 190 (H) 65 - 99 mg/dL   BUN 194 (H) 6 - 20 mg/dL    Comment: RESULTS CONFIRMED BY MANUAL DILUTION   Creatinine, Ser 4.56 (H) 0.44 - 1.00 mg/dL   Calcium 9.9 8.9 - 10.3 mg/dL   GFR calc non Af Amer 7 (L) >60 mL/min   GFR calc Af Amer 9 (L) >60 mL/min    Comment: (NOTE) The eGFR has been calculated using the CKD EPI equation. This calculation has not been validated in all clinical situations. eGFR's persistently <60 mL/min signify possible Chronic Kidney Disease.  Anion gap 14 5 - 15  CBC     Status: Abnormal   Collection Time: 09/07/17  2:07 PM  Result Value Ref Range   WBC 11.6 (H) 4.0 - 10.5 K/uL   RBC 4.45 3.87 - 5.11 MIL/uL   Hemoglobin 12.7 12.0 - 15.0 g/dL   HCT 40.8 36.0 - 46.0 %   MCV 91.7 78.0 - 100.0 fL   MCH 28.5 26.0 - 34.0 pg   MCHC 31.1 30.0 - 36.0 g/dL   RDW 15.4 11.5 - 15.5 %   Platelets 308 150 - 400 K/uL  Hepatic function panel     Status: Abnormal   Collection Time: 09/07/17  2:07 PM  Result Value Ref Range   Total Protein 7.7 6.5 - 8.1 g/dL   Albumin 3.7 3.5 - 5.0 g/dL   AST 26 15 - 41 U/L   ALT 13 (L) 14 - 54 U/L   Alkaline Phosphatase 113 38 - 126 U/L   Total Bilirubin 0.4 0.3 - 1.2 mg/dL   Bilirubin, Direct 0.1 0.1 - 0.5 mg/dL    Indirect Bilirubin 0.3 0.3 - 0.9 mg/dL  I-Stat CG4 Lactic Acid, ED     Status: None   Collection Time: 09/07/17  2:17 PM  Result Value Ref Range   Lactic Acid, Venous 1.77 0.5 - 1.9 mmol/L  CBC WITH DIFFERENTIAL     Status: Abnormal   Collection Time: 09/07/17  2:45 PM  Result Value Ref Range   WBC 11.4 (H) 4.0 - 10.5 K/uL   RBC 4.47 3.87 - 5.11 MIL/uL   Hemoglobin 12.6 12.0 - 15.0 g/dL   HCT 41.0 36.0 - 46.0 %   MCV 91.7 78.0 - 100.0 fL   MCH 28.2 26.0 - 34.0 pg   MCHC 30.7 30.0 - 36.0 g/dL   RDW 15.4 11.5 - 15.5 %   Platelets 309 150 - 400 K/uL   Neutrophils Relative % 90 %   Neutro Abs 10.2 (H) 1.7 - 7.7 K/uL   Lymphocytes Relative 6 %   Lymphs Abs 0.7 0.7 - 4.0 K/uL   Monocytes Relative 4 %   Monocytes Absolute 0.5 0.1 - 1.0 K/uL   Eosinophils Relative 0 %   Eosinophils Absolute 0.0 0.0 - 0.7 K/uL   Basophils Relative 0 %   Basophils Absolute 0.0 0.0 - 0.1 K/uL  Urinalysis, Routine w reflex microscopic     Status: Abnormal   Collection Time: 09/07/17  2:45 PM  Result Value Ref Range   Color, Urine BROWN (A) YELLOW    Comment: BIOCHEMICALS MAY BE AFFECTED BY COLOR   APPearance TURBID (A) CLEAR   Specific Gravity, Urine 1.015 1.005 - 1.030   pH 8.5 (H) 5.0 - 8.0   Glucose, UA NEGATIVE NEGATIVE mg/dL   Hgb urine dipstick LARGE (A) NEGATIVE   Bilirubin Urine NEGATIVE NEGATIVE   Ketones, ur TRACE (A) NEGATIVE mg/dL   Protein, ur 100 (A) NEGATIVE mg/dL   Nitrite NEGATIVE NEGATIVE   Leukocytes, UA LARGE (A) NEGATIVE  Urinalysis, Microscopic (reflex)     Status: Abnormal   Collection Time: 09/07/17  2:45 PM  Result Value Ref Range   RBC / HPF TOO NUMEROUS TO COUNT 0 - 5 RBC/hpf   WBC, UA 6-30 0 - 5 WBC/hpf   Bacteria, UA MANY (A) NONE SEEN   Squamous Epithelial / LPF NONE SEEN NONE SEEN   Amorphous Crystal PRESENT   I-Stat CG4 Lactic Acid, ED  (not at  [facility])  Status: None   Collection Time: 09/07/17  4:45 PM  Result Value Ref Range   Lactic Acid, Venous 1.49 0.5 -  1.9 mmol/L   Ct Abdomen Pelvis Wo Contrast  Result Date: 09/07/2017 CLINICAL DATA:  83 year old female with acute abdominal and pelvic pain with swelling. EXAM: CT ABDOMEN AND PELVIS WITHOUT CONTRAST TECHNIQUE: Multidetector CT imaging of the abdomen and pelvis was performed following the standard protocol without IV contrast. COMPARISON:  05/26/2008 CT FINDINGS: Please note that parenchymal abnormalities may be missed without intravenous contrast. Lower chest: No acute abnormality.  Cardiomegaly again identified. Hepatobiliary: The liver is unremarkable. Cholelithiasis identified without CT evidence of acute cholecystitis. No biliary dilatation. Pancreas: Unremarkable Spleen: Unremarkable Adrenals/Urinary Tract: Mild circumferential bladder wall thickening noted. Bilateral renal atrophy and probable bilateral renal cysts identified. There is no evidence of hydronephrosis or urinary calculi. The adrenal glands are unremarkable. Stomach/Bowel: Stomach is within normal limits. Appendix appears normal. No evidence of bowel wall thickening, distention, or inflammatory changes. A large amount of rectal stool is present. Colonic diverticulosis noted without evidence of diverticulitis Vascular/Lymphatic: Aortic atherosclerosis. No enlarged abdominal or pelvic lymph nodes. Reproductive: Patient is status posthysterectomy. A 4.7 cm left ovarian cyst is present, not significantly changed from 05/24/2011 ultrasound. Other: No ascites, focal collection or pneumoperitoneum. Musculoskeletal: No acute abnormality. IMPRESSION: 1. Mild circumferential bladder wall thickening which may be secondary to cystitis. Correlate clinically. 2. No other acute abnormality. 3. Large amount of rectal stool. 4.  Cholelithiasis without CT evidence of acute cholecystitis 5.  Aortic Atherosclerosis (ICD10-I70.0). Electronically Signed   By: Margarette Canada M.D.   On: 09/07/2017 18:26   Dg Chest 2 View  Result Date: 09/07/2017 CLINICAL DATA:   Unresponsive, tachycardia EXAM: CHEST  2 VIEW COMPARISON:  10/07/2010 FINDINGS: Cardiomegaly. Minimal left base atelectasis. Right lung is clear. No effusions or edema. No acute bony abnormality. IMPRESSION: Cardiomegaly.  Minimal left base atelectasis. Electronically Signed   By: Rolm Baptise M.D.   On: 09/07/2017 14:44   Ct Head Wo Contrast  Result Date: 09/07/2017 CLINICAL DATA:  Fall couple of weeks ago. Encephalopathy. Tachycardia. EXAM: CT HEAD WITHOUT CONTRAST TECHNIQUE: Contiguous axial images were obtained from the base of the skull through the vertex without intravenous contrast. COMPARISON:  07/13/2010 FINDINGS: Brain: No evidence of acute infarction, hemorrhage, hydrocephalus, extra-axial collection or mass lesion/mass effect. Small remote right cerebellar infarct. Moderate generalized atrophy without specific pattern. Chronic microvascular ischemic gliosis in the cerebral white matter. Vascular: Atherosclerotic calcification. Skull: No acute or aggressive finding. Sinuses/Orbits: No acute finding. IMPRESSION: 1. No acute finding. 2. Moderate atrophy. Electronically Signed   By: Monte Fantasia M.D.   On: 09/07/2017 19:36    Review of Systems  Unable to perform ROS: Patient nonverbal    Blood pressure 114/70, pulse (!) 149, temperature (!) 97 F (36.1 C), resp. rate 15, SpO2 100 %. Physical Exam   Physical Exam: GENERAL: Ill-appearing, thin, well-developed. Lying on her side in the bed, eyes closed, not speaking and not interacting. HENT: Normocephalic, atraumatic. Nares patent, without discharge or bleeding. No oropharyngeal lesions or erythema but OP is severely dry with dried debris in mouth.  EYES: Pupils equal, round, and reactive to light. No scleral icterus or injection. NECK: is supple, no masses, trachea midline. Thyroid non-tender and no masses appreciated. RESPIRATORY: Clear to auscultation bilaterally. Chest wall movements are symmetric. No use of accessory muscles to  breathe.  No wheezing, rales, rhonchi. CARDIOVASCULAR: Normal S1, S2. Murmur 2/6 systolic. No rubs, or  gallops. PMI non-displaced. Carotids: no carotid bruits. Marked tachycardia. DP pulses 1-2+ bilaterally. Capillary refill 4-5 seconds. Edema: None bilaterally. GI: soft, non-distended, normal active bowel sounds. No hepatosplenomegaly. No rigidity, rebound, or guarding. Non-tender. INTEGUMENT: Clean, dry, and intact. Sacral area with mild generalized erythema. MUSCULOSKELETAL: No cyanosis. No clubbing. No contractures. Normal muscle tone. Tenderness generalized to palpation of lower extremities.  NEUROLOGICAL: Cranial nerves 2-12 grossly intact, as best can be determined in this somnolent to obtunded patient. Reflexes: 2+ bilaterally. Babinski: toes downgoing bilaterally. Witnessed slight movement of all extremities but further motor testing no possible. Patient did move her head away when light shined in her eyes. Did moan at several points but was otherwise non-verbal. Not following commands.  PSYCHIATRIC: Not oriented. Non-verbal.  Mildly responsive to stimuli. LYMPHATIC: No cervical lymphadenopathy. No supraclavicular lymphadenopathy.    Assessment/Plan  Acute renal failure (ARF) (HCC) Admission Cr 4.56 and BUN 194. Likely contributing to her encephalopathy. Plan: Likely related to dehydration. IVF ordered. Recheck Cr. Avoid nephrotoxins.   Hypernatremia Severe. Likely from dehydration. Na is 153 on admission. Plan: Needed NS IVF initially due to severity of dehydration. Then switch to D5W and monitor Na q 4 hours; may need to change to fluids based on Na results.   Acute metabolic encephalopathy Likely due to uremia, dehydration. Plan: Monitor for neurologic changes. Treat acute renal failure, uremia, other issues. Palliative care consult.  SVT (supraventricular tachycardia) (HCC) Initially with SVT likely sinus tach due to severe dehydration and volume  depletion. Plan: Will allow a level of tachycardia due to dehydration level as patient is receiving IVFs, but ordered IV metoprolol prn for sustained tachycardia greater than 130s. Patient has a MOST form that indicates no aggressive therapy, but wants IV antibiotics and IVF. Give IVF.  DNR (do not resuscitate) She presents with a MOST form signed 03/29/2016 by herself and her doctor that indicates her wishes as:  - DNR status - Do not send to hospital except for comfort measures that can not be administered in the facility - IV antibiotic administration is acceptable - IVF administration is acceptable - No feeding tube Plan: Honor MOST plan. Palliative care consult.   Gross hematuria Likely related to cystitis. Known UTI prior to admission. Plan: Monitor H/H. Continue outpatient IM ertapenem, which is scheduled to be completed 09/11/17. Re-culture. Consider change of antibiotic depending on course.  Acute cystitis with hematuria Known UTI prior to admission. Plan: Continue outpatient IM ertapenem, which is scheduled to be completed 09/11/17. Re-culture. Consider change of antibiotic depending on course.  Hyperkalemia Plan: IVFs. Recheck potassium.  Acidosis Likely due to acute illness. Plan: Recheck level. Could consider bicarb if level drops severely.   Demand ischemia ([place]) Likely due to acute illness.  Plan: Honor MOST form; no aggressive treatment. PRN pain medication IV morphine. Telemetry. Treat underlying disorders.     Tacey Ruiz, MD 09/07/2017, 8:53 PM

## 2017-09-07 NOTE — ED Notes (Signed)
Patient transported to CT 

## 2017-09-07 NOTE — ED Notes (Signed)
Bed: WA16 Expected date:  Expected time:  Means of arrival:  Comments: Resus A 

## 2017-09-07 NOTE — ED Notes (Addendum)
Pt son, Linwood DibblesLarry Ryles [phone]. Call with updates or information

## 2017-09-07 NOTE — ED Provider Notes (Signed)
[facility]-EMERGENCY DEPT Provider Note   CSN: [id number] Arrival date & time: 09/07/17  1352     History   Chief Complaint Chief Complaint  Patient presents with  . Tachycardia    HPI Chelsea [name] is a 83 y.o. female brought in by EMS from her skilled nursing facility for tachycardia and altered mental status.  According to EMS report the patient was last normal at 7 AM.  She is currently therefore a right femur fracture.  She presents with a most form.  She is DO NOT RESUSCITATE however her intubation status is not mentioned.  They would like antibiotics and comfort measures.  There is a level 5 caveat due to altered mental status.  According to the report the patient was found altered just prior to arrival.  She was responsive to pain.  Found to be tachycardic to 140.  HPI  Past Medical History:  Diagnosis Date  . Cholelithiasis   . Chronic pain   . Constipation   . Diabetes mellitus without complication (HCC)   . Essential hypertension, benign 01/13/2013  . Hyperlipidemia   . Hypertension   . Osteoarthritis   . PUD (peptic ulcer disease)     Patient Active Problem List   Diagnosis Date Noted  . Acute renal failure (ARF) (HCC) 09/07/2017  . Gross hematuria 09/07/2017  . Hypernatremia 09/07/2017  . Acute metabolic encephalopathy 09/07/2017  . Acute cystitis with hematuria 09/07/2017  . Acidosis 09/07/2017  . Hyperkalemia 09/07/2017  . DNR (do not resuscitate) 09/07/2017  . SVT (supraventricular tachycardia) (HCC) 09/07/2017  . Vascular dementia without behavioral disturbance 08/29/2017  . Osteopenia 08/18/2017  . Cyst of left ovary 08/18/2017  . Seasonal allergies 09/07/2015  . Chronic renal disease, stage 4, severely decreased glomerular filtration rate between 15-29 mL/min/1.73 square meter (HCC) 11/19/2013  . Essential hypertension, benign 01/13/2013  . PUD (peptic ulcer disease) 01/13/2013  . Chronic constipation 01/13/2013  .  Osteoarthritis 01/13/2013    Past Surgical History:  Procedure Laterality Date  . ABDOMINAL HYSTERECTOMY    . CYSTOPLASTY    . right hip surgery      OB History    No data available       Home Medications    Prior to Admission medications   Medication Sig Start Date End Date Taking? Authorizing Provider  acetaminophen (TYLENOL) 325 MG tablet Take 650 mg by mouth every 8 (eight) hours as needed for mild pain.    Yes [provider]  amLODipine (NORVASC) 10 MG tablet Take 10 mg by mouth daily.   Yes [provider]  Carboxymethylcellul-Glycerin (REFRESH OPTIVE OP) Place 2 drops into both eyes 3 (three) times daily.   Yes [provider]  ertapenem (INVANZ) 1 g injection Inject 500 mg into the muscle daily. 09/01/17  Yes [provider]  guaiFENesin-dextromethorphan (ROBITUSSIN DM) 100-10 MG/5ML syrup Take 20 mLs by mouth every 6 (six) hours as needed for cough.   Yes [provider]  Multiple Vitamin (MULTIVITAMIN WITH MINERALS) TABS tablet Take 1 tablet by mouth daily.   Yes [provider]  sennosides-docusate sodium (SENOKOT-S) 8.6-50 MG tablet Take 2 tablets by mouth daily. For constipation   Yes [provider]    Family History No family history on file.  Social History Social History   Tobacco Use  . Smoking status: Never Smoker  . Smokeless tobacco: Never Used  Substance Use Topics  . Alcohol use: No  . Drug use: No  Allergies   Patient has no known allergies.   Review of Systems Review of Systems Ten systems reviewed and are negative for acute change, except as noted in the HPI.    Physical Exam Updated Vital Signs BP 130/88   Pulse (!) 140   Temp (!) 96.8 F (36 C)   Resp 14   SpO2 100%   Physical Exam  Constitutional: She appears lethargic. She appears toxic.  HENT:  Head: Normocephalic and atraumatic.  Dry oral mucosa  Eyes: Conjunctivae are normal. No scleral icterus.    Neck: No JVD present.  Cardiovascular: Regular rhythm and normal heart sounds. Exam reveals no gallop and no friction rub.  No murmur heard. Tachycardic  Pulmonary/Chest: Effort normal and breath sounds normal. No respiratory distress.  Abdominal: Soft. Bowel sounds are normal. She exhibits distension. She exhibits no mass. There is tenderness. There is no guarding.  Neurological: She appears lethargic. GCS eye subscore is 3. GCS verbal subscore is 2. GCS motor subscore is 5.  Skin: Skin is warm and dry. She is not diaphoretic.  Psychiatric: Her behavior is normal.  Nursing note and vitals reviewed.    ED Treatments / Results  Labs (all labs ordered are listed, but only abnormal results are displayed) Labs Reviewed  BASIC METABOLIC PANEL - Abnormal; Notable for the following components:      Result Value   Sodium 153 (*)    Potassium 5.9 (*)    Chloride 123 (*)    CO2 16 (*)    Glucose, Bld 190 (*)    BUN 194 (*)    Creatinine, Ser 4.56 (*)    GFR calc non Af Amer 7 (*)    GFR calc Af Amer 9 (*)    All other components within normal limits  CBC - Abnormal; Notable for the following components:   WBC 11.6 (*)    All other components within normal limits  CBC WITH DIFFERENTIAL/PLATELET - Abnormal; Notable for the following components:   WBC 11.4 (*)    Neutro Abs 10.2 (*)    All other components within normal limits  URINALYSIS, ROUTINE W REFLEX MICROSCOPIC - Abnormal; Notable for the following components:   Color, Urine BROWN (*)    APPearance TURBID (*)    pH 8.5 (*)    Hgb urine dipstick LARGE (*)    Ketones, ur TRACE (*)    Protein, ur 100 (*)    Leukocytes, UA LARGE (*)    All other components within normal limits  HEPATIC FUNCTION PANEL - Abnormal; Notable for the following components:   ALT 13 (*)    All other components within normal limits  URINALYSIS, MICROSCOPIC (REFLEX) - Abnormal; Notable for the following components:   Bacteria, UA MANY (*)    All other  components within normal limits  I-STAT TROPONIN, ED - Abnormal; Notable for the following components:   Troponin i, poc 0.69 (*)    All other components within normal limits  CULTURE, BLOOD (ROUTINE X 2)  CULTURE, BLOOD (ROUTINE X 2)  URINE CULTURE  LACTIC ACID, PLASMA  RENAL FUNCTION PANEL  RENAL FUNCTION PANEL  RENAL FUNCTION PANEL  LACTIC ACID, PLASMA  RENAL FUNCTION PANEL  RENAL FUNCTION PANEL  I-STAT CG4 LACTIC ACID, ED  I-STAT CG4 LACTIC ACID, ED  I-STAT CG4 LACTIC ACID, ED    EKG  EKG Interpretation  Date/Time:  Saturday September 07 2017 14:23:23 EST Ventricular Rate:  145 PR Interval:    QRS Duration: 79 QT  Interval:  285 QTC Calculation: 443 R Axis:   37 Text Interpretation:  Sinus tachycardia Baseline wander ST-t wave abnormality Abnormal ekg Confirmed by Gerhard Munch [phone]) on 09/07/2017 3:06:03 PM       Radiology Ct Abdomen Pelvis Wo Contrast  Result Date: 09/07/2017 CLINICAL DATA:  83 year old female with acute abdominal and pelvic pain with swelling. EXAM: CT ABDOMEN AND PELVIS WITHOUT CONTRAST TECHNIQUE: Multidetector CT imaging of the abdomen and pelvis was performed following the standard protocol without IV contrast. COMPARISON:  05/26/2008 CT FINDINGS: Please note that parenchymal abnormalities may be missed without intravenous contrast. Lower chest: No acute abnormality.  Cardiomegaly again identified. Hepatobiliary: The liver is unremarkable. Cholelithiasis identified without CT evidence of acute cholecystitis. No biliary dilatation. Pancreas: Unremarkable Spleen: Unremarkable Adrenals/Urinary Tract: Mild circumferential bladder wall thickening noted. Bilateral renal atrophy and probable bilateral renal cysts identified. There is no evidence of hydronephrosis or urinary calculi. The adrenal glands are unremarkable. Stomach/Bowel: Stomach is within normal limits. Appendix appears normal. No evidence of bowel wall thickening, distention, or inflammatory  changes. A large amount of rectal stool is present. Colonic diverticulosis noted without evidence of diverticulitis Vascular/Lymphatic: Aortic atherosclerosis. No enlarged abdominal or pelvic lymph nodes. Reproductive: Patient is status posthysterectomy. A 4.7 cm left ovarian cyst is present, not significantly changed from 05/24/2011 ultrasound. Other: No ascites, focal collection or pneumoperitoneum. Musculoskeletal: No acute abnormality. IMPRESSION: 1. Mild circumferential bladder wall thickening which may be secondary to cystitis. Correlate clinically. 2. No other acute abnormality. 3. Large amount of rectal stool. 4.  Cholelithiasis without CT evidence of acute cholecystitis 5.  Aortic Atherosclerosis (ICD10-I70.0). Electronically Signed   By: Harmon Pier M.D.   On: 09/07/2017 18:26   Dg Chest 2 View  Result Date: 09/07/2017 CLINICAL DATA:  Unresponsive, tachycardia EXAM: CHEST  2 VIEW COMPARISON:  10/07/2010 FINDINGS: Cardiomegaly. Minimal left base atelectasis. Right lung is clear. No effusions or edema. No acute bony abnormality. IMPRESSION: Cardiomegaly.  Minimal left base atelectasis. Electronically Signed   By: Charlett Nose M.D.   On: 09/07/2017 14:44   Ct Head Wo Contrast  Result Date: 09/07/2017 CLINICAL DATA:  Fall couple of weeks ago. Encephalopathy. Tachycardia. EXAM: CT HEAD WITHOUT CONTRAST TECHNIQUE: Contiguous axial images were obtained from the base of the skull through the vertex without intravenous contrast. COMPARISON:  07/13/2010 FINDINGS: Brain: No evidence of acute infarction, hemorrhage, hydrocephalus, extra-axial collection or mass lesion/mass effect. Small remote right cerebellar infarct. Moderate generalized atrophy without specific pattern. Chronic microvascular ischemic gliosis in the cerebral white matter. Vascular: Atherosclerotic calcification. Skull: No acute or aggressive finding. Sinuses/Orbits: No acute finding. IMPRESSION: 1. No acute finding. 2. Moderate atrophy.  Electronically Signed   By: Marnee Spring M.D.   On: 09/07/2017 19:36    Procedures .Critical Care Performed by: Arthor Captain, PA-C Authorized by: Arthor Captain, PA-C   Critical care provider statement:    Critical care time (minutes):  50   Critical care was necessary to treat or prevent imminent or life-threatening deterioration of the following conditions:  Renal failure, dehydration and metabolic crisis   Critical care was time spent personally by me on the following activities:  Development of treatment plan with patient or surrogate, discussions with consultants, evaluation of patient's response to treatment, examination of patient, interpretation of cardiac output measurements, ordering and performing treatments and interventions, ordering and review of laboratory studies, ordering and review of radiographic studies, pulse oximetry, re-evaluation of patient's condition, review of old charts and obtaining history from patient or  surrogate   (including critical care time)  Medications Ordered in ED Medications  dextrose 5 % solution ( Intravenous New Bag/Given 09/07/17 2114)  metoprolol tartrate (LOPRESSOR) injection 5 mg (5 mg Intravenous Given 09/07/17 2113)  sodium chloride 0.9 % bolus 1,000 mL (0 mLs Intravenous Stopped 09/07/17 1634)  metoprolol tartrate (LOPRESSOR) injection 2.5 mg (2.5 mg Intravenous Given 09/07/17 1545)  ceFEPIme (MAXIPIME) 2 g in dextrose 5 % 50 mL IVPB (0 g Intravenous Stopped 09/07/17 1732)  sodium chloride 0.9 % bolus 1,000 mL (0 mLs Intravenous Stopped 09/07/17 1835)  sodium chloride 0.9 % bolus 1,000 mL (0 mLs Intravenous Stopped 09/07/17 2220)     Initial Impression / Assessment and Plan / ED Course  I have reviewed the triage vital signs and the nursing notes.  Pertinent labs & imaging results that were available during my care of the patient were reviewed by me and considered in my medical decision making (see chart for details).  Clinical Course  as of Sep 07 2345  Sat Sep 07, 2017  1500 Patient noted to have an elevated troponin.  I suspect this is secondary to her heart rate of 143. Troponin i, poc: (!!) 0.69 [AH]  1500 Sodium: (!) 153 [AH]  1500 Potassium: (!) 5.9 [AH]  1500 BUN: (!) 194 [AH]  1500 Patient appears to be in acute renal failure with creatinine of 4.56 and a BUN of 194.  Her anion gap is only 14.  I expect that her acute change in mentation is secondary to her uremia. Creatinine: (!) 4.56 [AH]  1520 Patient appears to be in SVT. Will continue fluids and add very low dose of metoprolol  [AH]  1549 Lactic Acid, Venous: 1.77 [AH]  1623 Review of chart shows ovarian mass on CT abdomen 2 days ago. Patient with nearly 1 L retained urine on exam today. Urine was bloody and purulent. The patient will be given catheter. I have ordered cefepime. I have called the nursing home who states that she was alert/oriented and in charge of her own care. I have tried called her family Sherrie Sport( [name] Delconte) and left several voicemails.  I am concerned for obstructive uropathy. I have ordered a CT abd/pelvis  [AH]  1630 WBC: (!) 11.4 [AH]  1630 NEUT#: (!) 10.2 [AH]    Clinical Course User Index [AH] Arthor CaptainHarris, Noely Kuhnle, PA-C    Patient without obstructive uropathy.  She has electrolyte disarray and acute renal failure.  I did discuss the case with her sons who are aware and agree with DNR/DNI status.  Patient also has acute cystitis and treated here with cefepime.  Patient will be to stepdown unit.  She may need a palliative care consult.  She appears appropriate for inpatient services at this time.  Patient also in SVT persistently however Dr. Dalene SeltzerSchlossman and I felt that stopping this 83 year old to stop the SVT did not seem like a prudent idea at this time considering her status.  Final Clinical Impressions(s) / ED Diagnoses   Final diagnoses:  SVT (supraventricular tachycardia) (HCC)  Elevated troponin  Acute kidney injury (HCC)  Acute cystitis  without hematuria  Urinary retention  Toxic metabolic encephalopathy    ED Discharge Orders    None       Arthor CaptainHarris, Stepen Prins, PA-C 09/07/17 2346    Alvira MondaySchlossman, Erin, MD 09/13/17 1419

## 2017-09-07 NOTE — Progress Notes (Signed)
A consult was received from an ED physician for cefeoune per pharmacy dosing.  The patient's profile has been reviewed for ht/wt/allergies/indication/available labs.   A one time order has been placed for cefepime 2 gm.   Further antibiotics/pharmacy consults should be ordered by admitting physician if indicated.                       Thank you, Herby AbrahamMichelle T. Aryeh Butterfield, Pharm.D. [phone] 09/07/2017 4:35 PM

## 2017-09-08 DIAGNOSIS — Z7189 Other specified counseling: Secondary | ICD-10-CM

## 2017-09-08 DIAGNOSIS — Z515 Encounter for palliative care: Secondary | ICD-10-CM

## 2017-09-08 LAB — RENAL FUNCTION PANEL
ALBUMIN: 2.9 g/dL — AB (ref 3.5–5.0)
ANION GAP: 9 (ref 5–15)
ANION GAP: 8 (ref 5–15)
Albumin: 2.7 g/dL — ABNORMAL LOW (ref 3.5–5.0)
Albumin: 2.8 g/dL — ABNORMAL LOW (ref 3.5–5.0)
Albumin: 2.8 g/dL — ABNORMAL LOW (ref 3.5–5.0)
Anion gap: 9 (ref 5–15)
BUN: 175 mg/dL — AB (ref 6–20)
BUN: 116 mg/dL — AB (ref 6–20)
BUN: 168 mg/dL — ABNORMAL HIGH (ref 6–20)
BUN: 164 mg/dL — AB (ref 6–20)
CHLORIDE: 127 mmol/L — AB (ref 101–111)
CHLORIDE: 123 mmol/L — AB (ref 101–111)
CHLORIDE: 123 mmol/L — AB (ref 101–111)
CO2: 16 mmol/L — ABNORMAL LOW (ref 22–32)
CO2: 17 mmol/L — AB (ref 22–32)
CO2: 16 mmol/L — AB (ref 22–32)
CO2: 16 mmol/L — AB (ref 22–32)
CREATININE: 4.17 mg/dL — AB (ref 0.44–1.00)
Calcium: 8.7 mg/dL — ABNORMAL LOW (ref 8.9–10.3)
Calcium: 9.2 mg/dL (ref 8.9–10.3)
Calcium: 9 mg/dL (ref 8.9–10.3)
Calcium: 8.9 mg/dL (ref 8.9–10.3)
Chloride: >130 mmol/L (ref 101–111)
Creatinine, Ser: 4.06 mg/dL — ABNORMAL HIGH (ref 0.44–1.00)
Creatinine, Ser: 4.01 mg/dL — ABNORMAL HIGH (ref 0.44–1.00)
Creatinine, Ser: 4.11 mg/dL — ABNORMAL HIGH (ref 0.44–1.00)
GFR calc Af Amer: 9 mL/min — ABNORMAL LOW (ref >60)
GFR calc Af Amer: 10 mL/min — ABNORMAL LOW (ref >60)
GFR calc Af Amer: 10 mL/min — ABNORMAL LOW (ref >60)
GFR calc non Af Amer: 8 mL/min — ABNORMAL LOW (ref >60)
GFR calc non Af Amer: 9 mL/min — ABNORMAL LOW (ref >60)
GFR calc non Af Amer: 8 mL/min — ABNORMAL LOW (ref >60)
GFR, EST AFRICAN AMERICAN: 10 mL/min — AB (ref >60)
GFR, EST NON AFRICAN AMERICAN: 8 mL/min — AB (ref >60)
GLUCOSE: 209 mg/dL — AB (ref 65–99)
GLUCOSE: 218 mg/dL — AB (ref 65–99)
GLUCOSE: 200 mg/dL — AB (ref 65–99)
Glucose, Bld: 147 mg/dL — ABNORMAL HIGH (ref 65–99)
PHOSPHORUS: 5.2 mg/dL — AB (ref 2.5–4.6)
PHOSPHORUS: 5.3 mg/dL — AB (ref 2.5–4.6)
POTASSIUM: 4.4 mmol/L (ref 3.5–5.1)
POTASSIUM: 4.5 mmol/L (ref 3.5–5.1)
POTASSIUM: 4.4 mmol/L (ref 3.5–5.1)
Phosphorus: 5.9 mg/dL — ABNORMAL HIGH (ref 2.5–4.6)
Phosphorus: 5.5 mg/dL — ABNORMAL HIGH (ref 2.5–4.6)
Potassium: 4.7 mmol/L (ref 3.5–5.1)
Sodium: 155 mmol/L — ABNORMAL HIGH (ref 135–145)
Sodium: 153 mmol/L — ABNORMAL HIGH (ref 135–145)
Sodium: 148 mmol/L — ABNORMAL HIGH (ref 135–145)
Sodium: 147 mmol/L — ABNORMAL HIGH (ref 135–145)

## 2017-09-08 LAB — CBC
HEMATOCRIT: 33 % — AB (ref 36.0–46.0)
Hemoglobin: 10.1 g/dL — ABNORMAL LOW (ref 12.0–15.0)
MCH: 28.5 pg (ref 26.0–34.0)
MCHC: 30.6 g/dL (ref 30.0–36.0)
MCV: 93 fL (ref 78.0–100.0)
PLATELETS: 238 10*3/uL (ref 150–400)
RBC: 3.55 MIL/uL — ABNORMAL LOW (ref 3.87–5.11)
RDW: 15.2 % (ref 11.5–15.5)
WBC: 11.4 10*3/uL — AB (ref 4.0–10.5)

## 2017-09-08 LAB — BASIC METABOLIC PANEL
Anion gap: 6 (ref 5–15)
BUN: 116 mg/dL — AB (ref 6–20)
CHLORIDE: 128 mmol/L — AB (ref 101–111)
CO2: 18 mmol/L — AB (ref 22–32)
CREATININE: 4.05 mg/dL — AB (ref 0.44–1.00)
Calcium: 9.1 mg/dL (ref 8.9–10.3)
GFR calc Af Amer: 10 mL/min — ABNORMAL LOW (ref >60)
GFR calc non Af Amer: 8 mL/min — ABNORMAL LOW (ref >60)
GLUCOSE: 208 mg/dL — AB (ref 65–99)
Potassium: 4.7 mmol/L (ref 3.5–5.1)
SODIUM: 152 mmol/L — AB (ref 135–145)

## 2017-09-08 LAB — LACTIC ACID, PLASMA: Lactic Acid, Venous: 1.3 mmol/L (ref 0.5–1.9)

## 2017-09-08 LAB — MRSA PCR SCREENING: MRSA by PCR: NEGATIVE

## 2017-09-08 MED ORDER — ONDANSETRON HCL 4 MG/2ML IJ SOLN: 4.0000 mg | Freq: Four times a day (QID) | INTRAMUSCULAR | Status: DC | PRN

## 2017-09-08 MED ORDER — DEXTROSE 5 % IV SOLN
INTRAVENOUS | Status: AC
  Administered 2017-09-08 – 2017-09-09 (×2): via INTRAVENOUS

## 2017-09-08 MED ORDER — DILTIAZEM HCL 25 MG/5ML IV SOLN
5.0000 mg | Freq: Once | INTRAVENOUS | Status: AC
  Administered 2017-09-08: 5 mg via INTRAVENOUS
  Filled 2017-09-08: qty 5

## 2017-09-08 MED ORDER — ERTAPENEM SODIUM 1 G IJ SOLR
1.0000 g | INTRAMUSCULAR | Status: DC
  Filled 2017-09-08: qty 1

## 2017-09-08 MED ORDER — POLYVINYL ALCOHOL 1.4 % OP SOLN
2.0000 [drp] | Freq: Three times a day (TID) | OPHTHALMIC | Status: DC
  Administered 2017-09-08 – 2017-09-11 (×9): 2 [drp] via OPHTHALMIC
  Filled 2017-09-08: qty 15

## 2017-09-08 MED ORDER — SODIUM CHLORIDE 0.9 % IV SOLN: 1.0000 g | INTRAVENOUS | Status: DC

## 2017-09-08 MED ORDER — HEPARIN SODIUM (PORCINE) 5000 UNIT/ML IJ SOLN: 5000.0000 [IU] | Freq: Three times a day (TID) | INTRAMUSCULAR | Status: DC

## 2017-09-08 MED ORDER — ONDANSETRON HCL 4 MG PO TABS: 4.0000 mg | ORAL_TABLET | Freq: Four times a day (QID) | ORAL | Status: DC | PRN

## 2017-09-08 MED ORDER — SODIUM CHLORIDE 0.9 % IV SOLN
500.0000 mg | INTRAVENOUS | Status: AC
  Administered 2017-09-08 – 2017-09-11 (×4): 0.5 g via INTRAVENOUS
  Filled 2017-09-08 (×5): qty 0.5

## 2017-09-08 MED ORDER — ASPIRIN 300 MG RE SUPP
300.0000 mg | Freq: Every day | RECTAL | Status: DC
  Administered 2017-09-08 – 2017-09-11 (×4): 300 mg via RECTAL
  Filled 2017-09-08 (×4): qty 1

## 2017-09-08 MED ORDER — ACETAMINOPHEN 650 MG RE SUPP: 650.0000 mg | Freq: Four times a day (QID) | RECTAL | Status: DC | PRN

## 2017-09-08 MED ORDER — ERTAPENEM SODIUM 1 G IJ SOLR
500.0000 mg | INTRAMUSCULAR | Status: DC
  Filled 2017-09-08: qty 1

## 2017-09-08 MED ORDER — MORPHINE SULFATE (PF) 4 MG/ML IV SOLN
1.0000 mg | INTRAVENOUS | Status: DC | PRN
  Administered 2017-09-08 – 2017-09-11 (×6): 1 mg via INTRAVENOUS
  Filled 2017-09-08 (×6): qty 1

## 2017-09-08 MED ORDER — ACETAMINOPHEN 325 MG PO TABS: 650.0000 mg | ORAL_TABLET | Freq: Four times a day (QID) | ORAL | Status: DC | PRN

## 2017-09-08 NOTE — Assessment & Plan Note (Addendum)
Likely related to cystitis. Known UTI prior to admission. Plan: Monitor H/H. Continue outpatient IM ertapenem, which is scheduled to be completed 09/11/17. Re-culture. Consider change of antibiotic depending on course.

## 2017-09-08 NOTE — Progress Notes (Signed)
PROGRESS NOTE    Patient: Chelsea [name]     PCP: Kirt Boysarter, Monica, DO                    DOB: 11/14/1918            DOA: 09/07/2017 [id number]             DOS: 09/08/2017, 1:36 PM  Date of Service: the patient was seen and examined on 09/08/2017  ---------------------------------------------------------------------------------------------------------------------- Brief Narrative: The patient is a 83 yo woman who lives in a skilled facility, [name], who presents with altered mental status. The patient was somnolent to obtunded and is not speaking, so history is obtained from the records and the emergency department staff. Had a recent tib fracture November 2018 also. Regarding the altered mental status: Onset: Started on the day of admission,  Duration: constant.  Location: generalized. Character: Not speaking, not alert. Alleviated by: Nothing. Exacerbated by: Nothing. Associated Symptoms: Does have blood in urine.  Treatments: none at home except usual medications. Has been receiving Ertapenem 500 mg IM daily for a urinary tract infection x 10 days, to be completed on 09/11/17.  She presents with a MOST form signed 03/29/2016 by herself and her doctor that indicates her wishes as:  - DNR status - Do not send to hospital except for comfort measures that can not be administered in the facility - IV antibiotic administration is acceptable - IVF administration is acceptable - No feeding tube    Subjective: Patient was seen and examined, nonresponsive, awake per nursing staff as they move her and given oral care she needs to be in pain and discomfort.    Assessment & Plan:  Principal Problem:   Acute renal failure (ARF) (HCC) Active Problems:   Gross hematuria   Hypernatremia   Acute metabolic encephalopathy   Acute cystitis with hematuria   Acidosis   Hyperkalemia   DNR (do not resuscitate)   SVT (supraventricular tachycardia) (HCC)   Demand ischemia (HCC)    Acute  renal failure (ARF) (HCC)/  On Admission Cr 4.56 and BUN 194. Likely contributing to her encephalopathy/dehydration, poor p.o. intake Continue IV fluid hydration, BUN/creatinine improving Avoid nephrotoxins.   Hypernatremia Severe. Likely from dehydration. Na is 153 on admission, today 147 Needed NS IVF initially due to severity of dehydration. Cont.  D5W and monitor Na level     Acute metabolic encephalopathy - still encephalopathic, acute does not follow any commands, open her eyes Likely due to uremia, dehydration.  Monitor for neurologic changes. Treat acute renal failure, uremia, other issues. Palliative care consulted   SVT (supraventricular tachycardia) (HCC) Initially with SVT likely sinus tach due to severe dehydration and volume depletion.  Will allow a level of tachycardia due to dehydration level as patient is receiving IVFs,  ordered IV metoprolol prn for sustained tachycardia greater than 130s. Patient has a MOST form that indicates no aggressive therapy, but wants IV antibiotics and IVF. Give IVF.  DNR (do not resuscitate) She presents with a MOST form signed 03/29/2016 by herself and her doctor that indicates her wishes as:  - DNR status - Do not send to hospital except for comfort measures that can not be administered in the facility - IV antibiotic administration is acceptable - IVF administration is acceptable - No feeding tube Plan: Honor MOST plan. Palliative care consulted    Gross hematuria Likely related to cystitis. Known UTI prior to admission. Monitor H/H, stable  Continue  IM ertapenem,  which is scheduled to be completed 09/11/17.  Consider change of antibiotic depending on course.  Acute cystitis with hematuria Known UTI prior to admission. Continue outpatient IM ertapenem, which is scheduled to be completed 09/11/17. Urine recultured, negative to date   Hyperkalemia Continue IV fluids, monitoring, Recheck  potassium.  Acidosis Likely due to acute illness. Some improvement noted, considering bicarb   Demand ischemia (HCC) Likely due to acute illness.  Honor MOST form; no aggressive treatment. PRN pain medication IV morphine.  Treat underlying disorders.      DVT prophylaxis:  SQ heparin/SCDs/compression stocking Holding heparin since hematuria noted on Foley cath   Code Status:         DNR/DNI           MOST form viewed  Family Communication: Unable to speak to the patient secondary to encephalopathy, try to contact family members, POA unsuccessful thus far  Disposition Plan:  2-3 days, palliative care consult, anticipating comfort care measures, hospice              Consultants:   Palliative care team  Procedures:  No admission procedures for hospital encounter.   Antimicrobials:  Anti-infectives (From admission, onward)   Start     Dose/Rate Route Frequency Ordered Stop   09/08/17 0600  ertapenem [facility]) injection 1 g  Status:  Discontinued     1 g Intramuscular Every 24 hours 09/08/17 0059 09/08/17 0219   09/08/17 0600  ertapenem [facility]) injection 0.5 g  Status:  Discontinued     500 mg Intramuscular Every 24 hours 09/08/17 0219 09/08/17 0229   09/08/17 0600  ertapenem (INVANZ) 0.5 g in sodium chloride 0.9 % 50 mL IVPB     500 mg 100 mL/hr over 30 Minutes Intravenous Every 24 hours 09/08/17 0229 09/12/17 0559   09/08/17 0230  ertapenem (INVANZ) 1 g in sodium chloride 0.9 % 50 mL IVPB  Status:  Discontinued     1 g 100 mL/hr over 30 Minutes Intravenous Every 24 hours 09/08/17 0228 09/08/17 0229   09/07/17 1630  ceFEPIme (MAXIPIME) 2 g in dextrose 5 % 50 mL IVPB     2 g 100 mL/hr over 30 Minutes Intravenous  Once 09/07/17 1623 09/07/17 1732        Objective: Vitals:   09/08/17 0150 09/08/17 0240 09/08/17 0251 09/08/17 0532  BP: 113/78 115/84 99/68 124/78  Pulse: (!) 142 (!) 149 (!) 147 (!) 150  Resp: 16   (!) 22  Temp: 97.7 F (36.5 C)   97.6 F  (36.4 C)  TempSrc: Axillary   Axillary  SpO2: 98%   98%  Weight:    68.2 kg (150 lb 5.7 oz)    Intake/Output Summary (Last 24 hours) at 09/08/2017 1336 Last data filed at 09/08/2017 1610 Gross per 24 hour  Intake 2545.83 ml  Output -  Net 2545.83 ml   Filed Weights   09/08/17 0532  Weight: 68.2 kg (150 lb 5.7 oz)    Examination:  General exam: Appears calm and comfortable  Respiratory system: Clear to auscultation. Respiratory effort normal. Cardiovascular system: S1 & S2 heard, RRR. No JVD, murmurs, rubs, gallops or clicks. No pedal edema. Gastrointestinal system: Abdomen is nondistended, soft and nontender. No organomegaly or masses felt. Normal bowel sounds heard. Central nervous system: Alert and oriented. No focal neurological deficits. Extremities: Symmetric 5 x 5 power. Skin: No rashes, lesions or ulcers Psychiatry: Judgement and insight appear normal. Mood & affect appropriate.     Data  Reviewed: I have personally reviewed following labs and imaging studies  CBC: Recent Labs  Lab 09/07/17 1407 09/07/17 1445 09/08/17 0224  WBC 11.6* 11.4* 11.4*  NEUTROABS  --  10.2*  --   HGB 12.7 12.6 10.1*  HCT 40.8 41.0 33.0*  MCV 91.7 91.7 93.0  PLT 308 309 238   Basic Metabolic Panel: Recent Labs  Lab 09/07/17 1407 09/07/17 2230 09/08/17 0224 09/08/17 0849 09/08/17 1223  NA 153* 155* 152*  153* 148* 147*  K 5.9* 4.4 4.7  4.7 4.5 4.4  CL 123* >130* 128*  127* 123* 123*  CO2 16* 16* 18*  17* 16* 16*  GLUCOSE 190* 147* 208*  209* 218* 200*  BUN 194* 175* 116*  116* 168* 164*  CREATININE 4.56* 4.06* 4.05*  4.17* 4.01* 4.11*  CALCIUM 9.9 8.7* 9.1  9.2 9.0 8.9  PHOS  --  5.2* 5.9* 5.3* 5.5*   GFR: Estimated Creatinine Clearance: 7.6 mL/min (A) (by C-G formula based on SCr of 4.11 mg/dL (H)). Liver Function Tests: Recent Labs  Lab 09/07/17 1407 09/07/17 2230 09/08/17 0224 09/08/17 0849 09/08/17 1223  AST 26  --   --   --   --   ALT 13*  --   --    --   --   ALKPHOS 113  --   --   --   --   BILITOT 0.4  --   --   --   --   PROT 7.7  --   --   --   --   ALBUMIN 3.7 2.9* 2.7* 2.8* 2.8*   No results for input(s): LIPASE, AMYLASE in the last 168 hours. No results for input(s): AMMONIA in the last 168 hours. Coagulation Profile: No results for input(s): INR, PROTIME in the last 168 hours. Cardiac Enzymes: No results for input(s): CKTOTAL, CKMB, CKMBINDEX, TROPONINI in the last 168 hours. BNP (last 3 results) No results for input(s): PROBNP in the last 8760 hours. HbA1C: No results for input(s): HGBA1C in the last 72 hours. CBG: No results for input(s): GLUCAP in the last 168 hours. Lipid Profile: No results for input(s): CHOL, HDL, LDLCALC, TRIG, CHOLHDL, LDLDIRECT in the last 72 hours. Thyroid Function Tests: No results for input(s): TSH, T4TOTAL, FREET4, T3FREE, THYROIDAB in the last 72 hours. Anemia Panel: No results for input(s): VITAMINB12, FOLATE, FERRITIN, TIBC, IRON, RETICCTPCT in the last 72 hours. Sepsis Labs: Recent Labs  Lab 09/07/17 1417 09/07/17 1645 09/07/17 2131 09/08/17 0224  LATICACIDVEN 1.77 1.49 1.3 1.3    Recent Results (from the past 240 hour(s))  MRSA PCR Screening     Status: None   Collection Time: 09/08/17  1:48 AM  Result Value Ref Range Status   MRSA by PCR NEGATIVE NEGATIVE Final    Comment:        The GeneXpert MRSA Assay (FDA approved for NASAL specimens only), is one component of a comprehensive MRSA colonization surveillance program. It is not intended to diagnose MRSA infection nor to guide or monitor treatment for MRSA infections.        Radiology Studies: Ct Abdomen Pelvis Wo Contrast  Result Date: 09/07/2017 CLINICAL DATA:  83 year old female with acute abdominal and pelvic pain with swelling. EXAM: CT ABDOMEN AND PELVIS WITHOUT CONTRAST TECHNIQUE: Multidetector CT imaging of the abdomen and pelvis was performed following the standard protocol without IV contrast.  COMPARISON:  05/26/2008 CT FINDINGS: Please note that parenchymal abnormalities may be missed without intravenous contrast. Lower chest: No acute  abnormality.  Cardiomegaly again identified. Hepatobiliary: The liver is unremarkable. Cholelithiasis identified without CT evidence of acute cholecystitis. No biliary dilatation. Pancreas: Unremarkable Spleen: Unremarkable Adrenals/Urinary Tract: Mild circumferential bladder wall thickening noted. Bilateral renal atrophy and probable bilateral renal cysts identified. There is no evidence of hydronephrosis or urinary calculi. The adrenal glands are unremarkable. Stomach/Bowel: Stomach is within normal limits. Appendix appears normal. No evidence of bowel wall thickening, distention, or inflammatory changes. A large amount of rectal stool is present. Colonic diverticulosis noted without evidence of diverticulitis Vascular/Lymphatic: Aortic atherosclerosis. No enlarged abdominal or pelvic lymph nodes. Reproductive: Patient is status posthysterectomy. A 4.7 cm left ovarian cyst is present, not significantly changed from 05/24/2011 ultrasound. Other: No ascites, focal collection or pneumoperitoneum. Musculoskeletal: No acute abnormality. IMPRESSION: 1. Mild circumferential bladder wall thickening which may be secondary to cystitis. Correlate clinically. 2. No other acute abnormality. 3. Large amount of rectal stool. 4.  Cholelithiasis without CT evidence of acute cholecystitis 5.  Aortic Atherosclerosis (ICD10-I70.0). Electronically Signed   By: Harmon Pier M.D.   On: 09/07/2017 18:26   Dg Chest 2 View  Result Date: 09/07/2017 CLINICAL DATA:  Unresponsive, tachycardia EXAM: CHEST  2 VIEW COMPARISON:  10/07/2010 FINDINGS: Cardiomegaly. Minimal left base atelectasis. Right lung is clear. No effusions or edema. No acute bony abnormality. IMPRESSION: Cardiomegaly.  Minimal left base atelectasis. Electronically Signed   By: Charlett Nose M.D.   On: 09/07/2017 14:44   Ct Head  Wo Contrast  Result Date: 09/07/2017 CLINICAL DATA:  Fall couple of weeks ago. Encephalopathy. Tachycardia. EXAM: CT HEAD WITHOUT CONTRAST TECHNIQUE: Contiguous axial images were obtained from the base of the skull through the vertex without intravenous contrast. COMPARISON:  07/13/2010 FINDINGS: Brain: No evidence of acute infarction, hemorrhage, hydrocephalus, extra-axial collection or mass lesion/mass effect. Small remote right cerebellar infarct. Moderate generalized atrophy without specific pattern. Chronic microvascular ischemic gliosis in the cerebral white matter. Vascular: Atherosclerotic calcification. Skull: No acute or aggressive finding. Sinuses/Orbits: No acute finding. IMPRESSION: 1. No acute finding. 2. Moderate atrophy. Electronically Signed   By: Marnee Spring M.D.   On: 09/07/2017 19:36    Scheduled Meds: . aspirin  300 mg Rectal Daily  . heparin  5,000 Units Subcutaneous Q8H  . polyvinyl alcohol  2 drop Both Eyes TID   Continuous Infusions: . ertapenem Stopped (09/08/17 0607)     LOS: 1 day    Time spent: 35   Kendell Bane, MD [facility] Pager [phone]  If 7PM-7AM, please contact night-coverage [email or web address] Password [email or web address] 09/08/2017, 1:36 PM

## 2017-09-08 NOTE — Progress Notes (Signed)
CRITICAL VALUE ALERT  Critical Value:  Chloride > 130 in ED- result called now  Date & Time Notied:  09/08/17 0327  Provider Notified: Bruna PotterBlount  Orders Received/Actions taken: N/A

## 2017-09-08 NOTE — Assessment & Plan Note (Signed)
Known UTI prior to admission. Plan: Continue outpatient IM ertapenem, which is scheduled to be completed 09/11/17. Re-culture. Consider change of antibiotic depending on course.

## 2017-09-08 NOTE — Assessment & Plan Note (Signed)
Likely due to uremia, dehydration. Plan: Monitor for neurologic changes. Treat acute renal failure, uremia, other issues. Palliative care consult.

## 2017-09-08 NOTE — Assessment & Plan Note (Signed)
Plan: IVFs. Recheck potassium.

## 2017-09-08 NOTE — Assessment & Plan Note (Signed)
Likely due to acute illness.  Plan: Honor MOST form; no aggressive treatment. PRN pain medication IV morphine. Telemetry. Treat underlying disorders.

## 2017-09-08 NOTE — Progress Notes (Signed)
Unable to complete admission screening questions- patient unresponsive at this time. Will pass to next RN for family to provide info if available.

## 2017-09-08 NOTE — Progress Notes (Signed)
IV Cardizem 5mg  given and ineffective. HR remains in 140s, BP now 99/68. On-call MD Blount notified.

## 2017-09-08 NOTE — Assessment & Plan Note (Signed)
Admission Cr 4.56 and BUN 194. Likely contributing to her encephalopathy. Plan: Likely related to dehydration. IVF ordered. Recheck Cr. Avoid nephrotoxins.

## 2017-09-08 NOTE — Progress Notes (Signed)
Patient admitted from ED. HR continues to sustain in 140s. Patient is not alert but does moan and move her arms some when staff turns her. Mouth stays open. No signs of respiratory distress, all other VSS. Urine is very bloody- MD paged requesting to hold ordered Heparin. On-call MD Blount also paged regarding continued elevated HR. No new orders at this time. Will give PRN metoprolol when allowed if appropriate.

## 2017-09-08 NOTE — Assessment & Plan Note (Signed)
Initially with SVT likely sinus tach due to severe dehydration and volume depletion. Plan: Will allow a level of tachycardia due to dehydration level as patient is receiving IVFs, but ordered IV metoprolol prn for sustained tachycardia greater than 130s. Patient has a MOST form that indicates no aggressive therapy, but wants IV antibiotics and IVF. Give IVF.

## 2017-09-08 NOTE — Assessment & Plan Note (Signed)
Likely due to acute illness. Plan: Recheck level. Could consider bicarb if level drops severely.

## 2017-09-08 NOTE — Assessment & Plan Note (Signed)
Severe. Likely from dehydration. Na is 153 on admission. Plan: Needed NS IVF initially due to severity of dehydration. Then switch to D5W and monitor Na q 4 hours; may need to change to fluids based on Na results.

## 2017-09-08 NOTE — Evaluation (Signed)
Clinical/Bedside Swallow Evaluation Patient Details  Name: Chelsea [name] [place] MRN: [id number] Date of Birth: 04/01/1919  Today's Date: 09/08/2017 Time: SLP Start Time (ACUTE ONLY): 1535 SLP Stop Time (ACUTE ONLY): 1555 SLP Time Calculation (min) (ACUTE ONLY): 20 min  Past Medical History:  Past Medical History:  Diagnosis Date  . Cholelithiasis   . Chronic pain   . Constipation   . Diabetes mellitus without complication (HCC)   . Essential hypertension, benign 01/13/2013  . Hyperlipidemia   . Hypertension   . Osteoarthritis   . PUD (peptic ulcer disease)    Past Surgical History:  Past Surgical History:  Procedure Laterality Date  . ABDOMINAL HYSTERECTOMY    . CYSTOPLASTY    . right hip surgery     HPI:  83 yo woman who lives in a skilled facility, [name], who presented on 09/07/17 with altered mental status. The patient is somnolent to obtunded and is not speaking, so history is obtained from the records and the emergency department staff; nursing indicated pt is responding to pain only at this time; BSE ordered to assess swallow function.  Assessment / Plan / Recommendation Clinical Impression   Pt exhibited oropharyngeal dysphagia characterized by oral holding, decreased awareness of bolus, prolonged oral transit despite max verbal and tactile cues provided with immediate cough with ice chips noted during BSE; no other consistencies attempted d/t pt's inability to follow commands and severe risk for aspiration d/t cognitive impairments/lethargy; ST will f/u for PO readiness while in acute setting. SLP Visit Diagnosis: Dysphagia, unspecified (R13.10)    Aspiration Risk  Severe aspiration risk;Risk for inadequate nutrition/hydration    Diet Recommendation     Medication Administration: Via alternative means    Other  Recommendations Oral Care Recommendations: Oral care QID   Follow up Recommendations Skilled Nursing facility      Frequency and Duration min 2x/week  1  week       Prognosis Prognosis for Safe Diet Advancement: Guarded Barriers to Reach Goals: Cognitive deficits      Swallow Study   General Date of Onset: 09/07/17 HPI: 83 yo woman who lives in a skilled facility, [name], who presents today with altered mental status. The patient is somnolent to obtunded and is not speaking, so history is obtained from the records and the emergency department staff Type of Study: Bedside Swallow Evaluation Diet Prior to this Study: NPO Temperature Spikes Noted: No Respiratory Status: Room air History of Recent Intubation: No Behavior/Cognition: Confused;Lethargic/Drowsy Oral Cavity Assessment: Dry Oral Care Completed by SLP: Yes(partial) Oral Cavity - Dentition: Edentulous Self-Feeding Abilities: Total assist Patient Positioning: Upright in bed Baseline Vocal Quality: Not observed Volitional Cough: Cognitively unable to elicit Volitional Swallow: Unable to elicit    Oral/Motor/Sensory Function Overall Oral Motor/Sensory Function: Other (comment)(DTA)   Ice Chips Ice chips: Impaired Presentation: Spoon Oral Phase Impairments: Reduced lingual movement/coordination;Poor awareness of bolus Oral Phase Functional Implications: Prolonged oral transit;Oral holding Pharyngeal Phase Impairments: Suspected delayed Swallow;Cough - Immediate   Thin Liquid Thin Liquid: Not tested    Nectar Thick Nectar Thick Liquid: Not tested   Honey Thick Honey Thick Liquid: Not tested   Puree Puree: Not tested   Solid      Solid: Not tested        Tressie StalkerPat Holly Iannaccone, M.S., CCC-SLP 09/08/2017,4:39 PM

## 2017-09-08 NOTE — Consult Note (Signed)
Consultation Note Date: 09/08/2017   Patient Name: Chelsea LowersBertha E Guard  DOB: 02/16/1919  MRN: [id number]  Age / Sex: 83 y.o., female  PCP: Kirt Boysarter, Monica, DO Referring Physician: Kendell BaneShahmehdi, Seyed A, MD  Reason for Consultation: Establishing goals of care  HPI/Patient Profile: 83 y.o. female  admitted on 09/07/2017   Clinical Assessment and Goals of Care:  83 [facility] admitted to hospital medicine service with acute renal failure, acute metabolic encephalopathy, recent urinary tract infection, CT scan showing acute cystitis, having hematuria in this hospitalization, electrolyte abnormality such as hyper need she media and hyperkalemia, was also noted to have supraventricular tachycardia in the emergency department.  Patient arrived with the most form from June 2017 reflecting her wishes of DO NOT RESUSCITATE, do not center hospital except for comfort measures, acceptable use of IV antibiotics and IV fluids, no feeding tube.  It is reported on chart review that the patient has 2 sons. Patient is admitted to hospital medicine service on IV fluids and IV antibiotics. Palliative consult for ongoing goals of care discussions.  Pleasant elderly lady resting in bed. She opens her eyes when name is called, tends to track me in the room. She is wearing hand mittens bilaterally. She does not appear to be in any acute distress or discomfort. She does not appear to be in distress or discomfort, is nonverbal. Is pale and weak and chronically ill-appearing.  See recommendations below. Thank you for the consult.  NEXT OF KIN  has 2 sons.   SUMMARY OF RECOMMENDATIONS    Agree with DNR DNI Agree with current scope of hospitalization Maintain comfort along with supportive therapy with IVF and IV Antibiotics as per patient's MOST form Anticipated hospital death, attempted to reach family  without success, have called son Sherrie SportRoger Castrellon at [phone] as well as son Linwood DibblesLarry Helseth at [phone].   Code Status/Advance Care Planning:  DNR    Symptom Management:    as above   Palliative Prophylaxis:   Bowel Regimen   Psycho-social/Spiritual:   Desire for further Chaplaincy support:no  Additional Recommendations: Caregiving  Support/Resources  Prognosis:   Hours - Days  Discharge Planning: Anticipated Hospital Death      Primary Diagnoses: Present on Admission: . Acute renal failure (ARF) (HCC) . Gross hematuria . Hypernatremia . Acute metabolic encephalopathy . Acute cystitis with hematuria . Acidosis . Hyperkalemia . DNR (do not resuscitate) . SVT (supraventricular tachycardia) (HCC) . Demand ischemia (HCC)   I have reviewed the medical record, interviewed the patient and family, and examined the patient. The following aspects are pertinent.  Past Medical History:  Diagnosis Date  . Cholelithiasis   . Chronic pain   . Constipation   . Diabetes mellitus without complication (HCC)   . Essential hypertension, benign 01/13/2013  . Hyperlipidemia   . Hypertension   . Osteoarthritis   . PUD (peptic ulcer disease)    Social History   Socioeconomic History  . Marital status: Widowed    Spouse  name: None  . Number of children: None  . Years of education: None  . Highest education level: None  Social Needs  . Financial resource strain: None  . Food insecurity - worry: None  . Food insecurity - inability: None  . Transportation needs - medical: None  . Transportation needs - non-medical: None  Occupational History  . None  Tobacco Use  . Smoking status: Never Smoker  . Smokeless tobacco: Never Used  Substance and Sexual Activity  . Alcohol use: No  . Drug use: No  . Sexual activity: None  Other Topics Concern  . None  Social History Narrative  . None   No family history on file. Scheduled Meds: . aspirin  300 mg Rectal Daily  .  heparin  5,000 Units Subcutaneous Q8H  . polyvinyl alcohol  2 drop Both Eyes TID   Continuous Infusions: . dextrose    . ertapenem Stopped (09/08/17 0607)   PRN Meds:.acetaminophen **OR** acetaminophen, metoprolol tartrate, morphine injection, ondansetron **OR** ondansetron (ZOFRAN) IV Medications Prior to Admission:  Prior to Admission medications   Medication Sig Start Date End Date Taking? Authorizing Provider  acetaminophen (TYLENOL) 325 MG tablet Take 650 mg by mouth every 8 (eight) hours as needed for mild pain.    Yes [provider]  amLODipine (NORVASC) 10 MG tablet Take 10 mg by mouth daily.   Yes [provider]  Carboxymethylcellul-Glycerin (REFRESH OPTIVE OP) Place 2 drops into both eyes 3 (three) times daily.   Yes [provider]  ertapenem (INVANZ) 1 g injection Inject 500 mg into the muscle daily. 09/01/17  Yes [provider]  guaiFENesin-dextromethorphan (ROBITUSSIN DM) 100-10 MG/5ML syrup Take 20 mLs by mouth every 6 (six) hours as needed for cough.   Yes [provider]  Multiple Vitamin (MULTIVITAMIN WITH MINERALS) TABS tablet Take 1 tablet by mouth daily.   Yes [provider]  sennosides-docusate sodium (SENOKOT-S) 8.6-50 MG tablet Take 2 tablets by mouth daily. For constipation   Yes [provider]   No Known Allergies Review of Systems Non verbal  Physical Exam Weak elderly lady resting in bed Hands in mittens No distress noted Does not verbalize Shallow clear breath sounds S1 S2 Abdomen soft Opens eyes  Vital Signs: BP 106/63 (BP Location: Right Arm)   Pulse (!) 39   Temp (!) 97.5 F (36.4 C) (Axillary)   Resp 16   Wt 68.2 kg (150 lb 5.7 oz)   SpO2 92%   BMI 25.81 kg/m  Pain Assessment: PAINAD       SpO2: SpO2: 92 % O2 Device:SpO2: 92 % O2 Flow Rate: .   IO: Intake/output summary:   Intake/Output Summary (Last 24 hours) at 09/08/2017 1430 Last data filed at 09/08/2017  0607 Gross per 24 hour  Intake 2145.83 ml  Output -  Net 2145.83 ml    LBM:   Baseline Weight: Weight: 68.2 kg (150 lb 5.7 oz) Most recent weight: Weight: 68.2 kg (150 lb 5.7 oz)     Palliative Assessment/Data:   PPS 10%  Time In:  1400 Time Out:  1500 Time Total:  60  Greater than 50%  of this time was spent counseling and coordinating care related to the above assessment and plan.  Signed by: Rosalin HawkingZeba Karinda Cabriales, MD  [phone]  Please contact Palliative Medicine Team phone at [phone] for questions and concerns.  For individual provider: See Loretha StaplerAmion

## 2017-09-09 ENCOUNTER — Other Ambulatory Visit: Payer: Self-pay

## 2017-09-09 LAB — CBC WITH DIFFERENTIAL/PLATELET
BASOS PCT: 0 %
Basophils Absolute: 0 10*3/uL (ref 0.0–0.1)
EOS ABS: 0.3 10*3/uL (ref 0.0–0.7)
EOS PCT: 4 %
HCT: 29.8 % — ABNORMAL LOW (ref 36.0–46.0)
Hemoglobin: 9.1 g/dL — ABNORMAL LOW (ref 12.0–15.0)
Lymphocytes Relative: 10 %
Lymphs Abs: 0.9 10*3/uL (ref 0.7–4.0)
MCH: 27.8 pg (ref 26.0–34.0)
MCHC: 30.5 g/dL (ref 30.0–36.0)
MCV: 91.1 fL (ref 78.0–100.0)
MONO ABS: 0.4 10*3/uL (ref 0.1–1.0)
MONOS PCT: 5 %
NEUTROS PCT: 81 %
Neutro Abs: 6.8 10*3/uL (ref 1.7–7.7)
PLATELETS: 153 10*3/uL (ref 150–400)
RBC: 3.27 MIL/uL — ABNORMAL LOW (ref 3.87–5.11)
RDW: 14.7 % (ref 11.5–15.5)
WBC: 8.4 10*3/uL (ref 4.0–10.5)

## 2017-09-09 LAB — COMPREHENSIVE METABOLIC PANEL
ALBUMIN: 2.8 g/dL — AB (ref 3.5–5.0)
ALT: 12 U/L — ABNORMAL LOW (ref 14–54)
ANION GAP: 8 (ref 5–15)
AST: 24 U/L (ref 15–41)
Alkaline Phosphatase: 81 U/L (ref 38–126)
BUN: 170 mg/dL — ABNORMAL HIGH (ref 6–20)
CO2: 14 mmol/L — AB (ref 22–32)
Calcium: 8.7 mg/dL — ABNORMAL LOW (ref 8.9–10.3)
Chloride: 122 mmol/L — ABNORMAL HIGH (ref 101–111)
Creatinine, Ser: 3.83 mg/dL — ABNORMAL HIGH (ref 0.44–1.00)
GFR calc non Af Amer: 9 mL/min — ABNORMAL LOW (ref >60)
GFR, EST AFRICAN AMERICAN: 11 mL/min — AB (ref >60)
GLUCOSE: 178 mg/dL — AB (ref 65–99)
POTASSIUM: 4.1 mmol/L (ref 3.5–5.1)
SODIUM: 144 mmol/L (ref 135–145)
Total Bilirubin: 0.4 mg/dL (ref 0.3–1.2)
Total Protein: 5.4 g/dL — ABNORMAL LOW (ref 6.5–8.1)

## 2017-09-09 MED ORDER — DEXTROSE 5 % IV SOLN
INTRAVENOUS | Status: AC
  Administered 2017-09-09: 12:00:00 via INTRAVENOUS

## 2017-09-09 NOTE — Progress Notes (Signed)
   09/09/17 1656  Clinical Encounter Type  Visited With Patient;Health care provider  Visit Type Initial  Spiritual Encounters  Spiritual Needs Prayer   Rounding on Palliative Patients.  2 Health Care Providers were present.  Patient mostly unresponsive.  Provided prayer for her.  She will most likely be moving to Hospice depending on bed availability.  Will follow as needed. Chaplain Agustin CreeNewton Virgil Slinger

## 2017-09-09 NOTE — Care Management Note (Signed)
Case Management Note  Patient Details  Name: Chelsea [name] MRN: [id number] Date of Birth: 09/03/1919  Subjective/Objective:   83 yo pt admitted with Acute renal failure.                 Action/Plan: MD has not be able to speak with pt's sons.  Pt may have hospital death,   Expected Discharge Date:                  Expected Discharge Plan:  Home w Hospice Care(or Hospice Facility)  In-House Referral:  Clinical Social Work  Discharge planning Services  CM Consult  Post Acute Care Choice:    Choice offered to:     DME Arranged:    DME Agency:     HH Arranged:    HH Agency:     Status of Service:  In process, will continue to follow  If discussed at Long Length of Stay Meetings, dates discussed:    Additional Comments:  Geni BersMcGibboney, Ling Flesch, RN 09/09/2017, 11:22 AM

## 2017-09-09 NOTE — Progress Notes (Signed)
PMT progress note  Patient is an elderly lady resting in bed, she has shallow breathing, she does not appear to be in distress, has sighing shallow respirations, no apnea spells noted. No family at bedside, she is receiving Morphine PRNs.   BP 114/89 (BP Location: Right Arm)   Pulse 73   Temp 98 F (36.7 C) (Axillary)   Resp 14   Wt 65.9 kg (145 lb 4.5 oz)   SpO2 98%   BMI 24.94 kg/m  Labs and imaging noted.   Resting in bed, does not open eyes, does not follow comands S1 S2 Shallow breathing Abdomen soft No edema No coolness no mottling noted yet Essentially unresponsive but comfortable  AKI Acute metabolic encephalopathy SVT in the ED Hypernatremia Acute cystitis, hematuria  PLAN: Call placed and discussed with son Chelsea [name] at [phone]. I discussed with him about comfort care and hospice philosophy of care, patient's husband died at home under hospice care several years ago. Son Chelsea [name] states he is aware that the patient is seriously ill and has limited prognosis. He is agreeable to full comfort care and transfer to residential hospice depending on bed availability.   35 minutes spent Chelsea HawkingZeba Ewart Carrera MD [facility] palliative medicine team [phone]  [phone]

## 2017-09-09 NOTE — Progress Notes (Addendum)
PROGRESS NOTE    Patient: Chelsea [name]     PCP: Kirt Boys, DO                    DOB: 1919-09-02            DOA: 09/07/2017 ZOX:[id number]             DOS: 09/09/2017, 12:10 PM  Date of Service: the patient was seen and examined on 09/09/2017  ---------------------------------------------------------------------------------------------------------------------- Brief Narrative: The patient is a 83 yo woman who lives in a skilled facility, [name], who presents with altered mental status. The patient was somnolent to obtunded and is not speaking, so history is obtained from the records and the emergency department staff. Had a recent tib fracture November 2018 also. Regarding the altered mental status: Onset: Started on the day of admission,  Duration: constant.  Location: generalized. Character: Not speaking, not alert. Alleviated by: Nothing. Exacerbated by: Nothing. Associated Symptoms: Does have blood in urine.  Treatments: none at home except usual medications. Has been receiving Ertapenem 500 mg IM daily for a urinary tract infection x 10 days, to be completed on 09/11/17.  She presents with a MOST form signed 03/29/2016 by herself and her doctor that indicates her wishes as:  - DNR status - Do not send to hospital except for comfort measures that can not be administered in the facility - IV antibiotic administration is acceptable - IVF administration is acceptable - No feeding tube _______________________________________________________________________________________________   Subjective: Patient was seen and examined this morning. She is  Awake, eyes open, does not follow any command, nonverbal. Contracted to one side. Palliative following.    Assessment & Plan:   Acute renal failure (ARF) (HCC)/  On Admission Cr 4.56 and BUN 194. Likely contributing to her encephalopathy/dehydration, poor p.o. intake Continue IV fluid hydration, BUN/creatinine improving Avoid  nephrotoxins.   Hypernatremia Severe. Likely from dehydration. Na is 153 on admission, today 144  Needed NS IVF initially due to severity of dehydration. Cont.  D5W and monitor Na level     Acute metabolic encephalopathy -possibly toxic metabolic encephalopathy exacerbated by UTI infection with underlying dementia and poor cognition Still does not follow any commands Also due to uremia, dehydration.  Monitor for neurologic changes. Treat acute renal failure, uremia, other issues. Palliative care consulted and following anticipating recommendation for hospice  SVT (supraventricular tachycardia) (HCC) Initially with SVT likely sinus tach due to severe dehydration and volume depletion.  Will allow a level of tachycardia due to dehydration level as patient is receiving IVFs,  ordered IV metoprolol prn for sustained tachycardia greater than 130s. Patient has a MOST form that indicates no aggressive therapy, but wants IV antibiotics and IVF. Give IVF.  DNR (do not resuscitate) She presents with a MOST form signed 03/29/2016 by herself and her doctor that indicates her wishes as:  - DNR status - Do not send to hospital except for comfort measures that can not be administered in the facility - IV antibiotic administration is acceptable - IVF administration is acceptable - No feeding tube Plan: Honor MOST plan. Palliative care consulted    Gross hematuria Likely related to cystitis. Known UTI prior to admission. Monitor H/H, stable  Continue  IM ertapenem, which is scheduled to be completed 09/11/17.  Consider change of antibiotic depending on course.  Acute cystitis with hematuria Known UTI prior to admission. Continue outpatient IM ertapenem, which is scheduled to be completed 09/11/17. Urine recultured, negative to date  Hyperkalemia Continue IV fluids, monitoring, Recheck potassium.  Acidosis Likely due to acute illness. Some improvement noted, considering  bicarb   Demand ischemia (HCC) Likely due to acute illness.  Honor MOST form; no aggressive treatment. PRN pain medication IV morphine.  Treat underlying disorders.  DVT prophylaxis:  SQ heparin/SCDs/compression stocking Holding heparin since hematuria noted on Foley cath   Code Status:         DNR/DNI           MOST form viewed  Family Communication: Unable to speak to the patient secondary to encephalopathy, try to contact family members, POA unsuccessful thus far  Disposition Plan:  2-3 days, palliative care consult, anticipating comfort care measures, hospice              Consultants:   Palliative care team  Procedures:  No admission procedures for hospital encounter.   Antimicrobials:  Anti-infectives (From admission, onward)   Start     Dose/Rate Route Frequency Ordered Stop   09/08/17 0600  ertapenem [facility]) injection 1 g  Status:  Discontinued     1 g Intramuscular Every 24 hours 09/08/17 0059 09/08/17 0219   09/08/17 0600  ertapenem [facility]) injection 0.5 g  Status:  Discontinued     500 mg Intramuscular Every 24 hours 09/08/17 0219 09/08/17 0229   09/08/17 0600  ertapenem (INVANZ) 0.5 g in sodium chloride 0.9 % 50 mL IVPB     500 mg 100 mL/hr over 30 Minutes Intravenous Every 24 hours 09/08/17 0229 09/12/17 0559   09/08/17 0230  ertapenem (INVANZ) 1 g in sodium chloride 0.9 % 50 mL IVPB  Status:  Discontinued     1 g 100 mL/hr over 30 Minutes Intravenous Every 24 hours 09/08/17 0228 09/08/17 0229   09/07/17 1630  ceFEPIme (MAXIPIME) 2 g in dextrose 5 % 50 mL IVPB     2 g 100 mL/hr over 30 Minutes Intravenous  Once 09/07/17 1623 09/07/17 1732        Objective: Vitals:   09/08/17 2153 09/09/17 0500 09/09/17 0643 09/09/17 1026  BP: 108/81  111/61 114/89  Pulse: (!) 146  (!) 143 73  Resp: 17  14   Temp: 97.6 F (36.4 C)  (!) 97.5 F (36.4 C) 98 F (36.7 C)  TempSrc: Axillary  Axillary Axillary  SpO2: 93%  99% 98%  Weight:  65.9 kg (145 lb 4.5  oz)      Intake/Output Summary (Last 24 hours) at 09/09/2017 1210 Last data filed at 09/09/2017 0700 Gross per 24 hour  Intake 0 ml  Output -  Net 0 ml   Filed Weights   09/08/17 0532 09/09/17 0500  Weight: 68.2 kg (150 lb 5.7 oz) 65.9 kg (145 lb 4.5 oz)    Examination:  General exam: Appears calm and comfortable  Respiratory system: Clear to auscultation. Respiratory effort normal. Cardiovascular system: S1 & S2 heard, RRR. No JVD, murmurs, rubs, gallops or clicks. No pedal edema. Gastrointestinal system: Abdomen is nondistended, soft and nontender. No organomegaly or masses felt. Normal bowel sounds heard. Central nervous system: Alert and oriented. No focal neurological deficits. Extremities: Symmetric 5 x 5 power. Skin: No rashes, lesions or ulcers Psychiatry: Judgement and insight appear normal. Mood & affect appropriate.     Data Reviewed: I have personally reviewed following labs and imaging studies  CBC: Recent Labs  Lab 09/07/17 1407 09/07/17 1445 09/08/17 0224 09/09/17 0824  WBC 11.6* 11.4* 11.4* 8.4  NEUTROABS  --  10.2*  --  6.8  HGB 12.7 12.6 10.1* 9.1*  HCT 40.8 41.0 33.0* 29.8*  MCV 91.7 91.7 93.0 91.1  PLT 308 309 238 153   Basic Metabolic Panel: Recent Labs  Lab 09/07/17 2230 09/08/17 0224 09/08/17 0849 09/08/17 1223 09/09/17 0824  NA 155* 152*  153* 148* 147* 144  K 4.4 4.7  4.7 4.5 4.4 4.1  CL >130* 128*  127* 123* 123* 122*  CO2 16* 18*  17* 16* 16* 14*  GLUCOSE 147* 208*  209* 218* 200* 178*  BUN 175* 116*  116* 168* 164* 170*  CREATININE 4.06* 4.05*  4.17* 4.01* 4.11* 3.83*  CALCIUM 8.7* 9.1  9.2 9.0 8.9 8.7*  PHOS 5.2* 5.9* 5.3* 5.5*  --    GFR: Estimated Creatinine Clearance: 8 mL/min (A) (by C-G formula based on SCr of 3.83 mg/dL (H)). Liver Function Tests: Recent Labs  Lab 09/07/17 1407 09/07/17 2230 09/08/17 0224 09/08/17 0849 09/08/17 1223 09/09/17 0824  AST 26  --   --   --   --  24  ALT 13*  --   --   --    --  12*  ALKPHOS 113  --   --   --   --  81  BILITOT 0.4  --   --   --   --  0.4  PROT 7.7  --   --   --   --  5.4*  ALBUMIN 3.7 2.9* 2.7* 2.8* 2.8* 2.8*   No results for input(s): LIPASE, AMYLASE in the last 168 hours. No results for input(s): AMMONIA in the last 168 hours. Coagulation Profile: No results for input(s): INR, PROTIME in the last 168 hours. Cardiac Enzymes: No results for input(s): CKTOTAL, CKMB, CKMBINDEX, TROPONINI in the last 168 hours. BNP (last 3 results) No results for input(s): PROBNP in the last 8760 hours. HbA1C: No results for input(s): HGBA1C in the last 72 hours. CBG: No results for input(s): GLUCAP in the last 168 hours. Lipid Profile: No results for input(s): CHOL, HDL, LDLCALC, TRIG, CHOLHDL, LDLDIRECT in the last 72 hours. Thyroid Function Tests: No results for input(s): TSH, T4TOTAL, FREET4, T3FREE, THYROIDAB in the last 72 hours. Anemia Panel: No results for input(s): VITAMINB12, FOLATE, FERRITIN, TIBC, IRON, RETICCTPCT in the last 72 hours. Sepsis Labs: Recent Labs  Lab 09/07/17 1417 09/07/17 1645 09/07/17 2131 09/08/17 0224  LATICACIDVEN 1.77 1.49 1.3 1.3    Recent Results (from the past 240 hour(s))  Blood Culture (routine x 2)     Status: None (Preliminary result)   Collection Time: 09/07/17  2:45 PM  Result Value Ref Range Status   Specimen Description BLOOD BLOOD LEFT FOREARM  Final   Special Requests   Final    IN PEDIATRIC BOTTLE Blood Culture results may not be optimal due to an excessive volume of blood received in culture bottles   Culture   Final    NO GROWTH 2 DAYS Performed at [facility], [address] [place], [place] [address]    Report Status PENDING  Incomplete  Blood Culture (routine x 2)     Status: None (Preliminary result)   Collection Time: 09/07/17  2:45 PM  Result Value Ref Range Status   Specimen Description BLOOD LEFT ANTECUBITAL  Final   Special Requests   Final    BOTTLES DRAWN AEROBIC AND  ANAEROBIC Blood Culture adequate volume   Culture   Final    NO GROWTH 2 DAYS Performed at [facility], [address] [place], [place] [address]    Report Status PENDING  Incomplete  Urine culture     Status: None (Preliminary result)   Collection Time: 09/07/17  3:39 PM  Result Value Ref Range Status   Specimen Description URINE, CATHETERIZED  Final   Special Requests NONE Normal  Final   Culture   Final    CULTURE REINCUBATED FOR BETTER GROWTH Performed at [facility], [address] [place], [place] [address]    Report Status PENDING  Incomplete  MRSA PCR Screening     Status: None   Collection Time: 09/08/17  1:48 AM  Result Value Ref Range Status   MRSA by PCR NEGATIVE NEGATIVE Final    Comment:        The GeneXpert MRSA Assay (FDA approved for NASAL specimens only), is one component of a comprehensive MRSA colonization surveillance program. It is not intended to diagnose MRSA infection nor to guide or monitor treatment for MRSA infections.        Radiology Studies: Ct Abdomen Pelvis Wo Contrast  Result Date: 09/07/2017 CLINICAL DATA:  83 year old female with acute abdominal and pelvic pain with swelling. EXAM: CT ABDOMEN AND PELVIS WITHOUT CONTRAST TECHNIQUE: Multidetector CT imaging of the abdomen and pelvis was performed following the standard protocol without IV contrast. COMPARISON:  05/26/2008 CT FINDINGS: Please note that parenchymal abnormalities may be missed without intravenous contrast. Lower chest: No acute abnormality.  Cardiomegaly again identified. Hepatobiliary: The liver is unremarkable. Cholelithiasis identified without CT evidence of acute cholecystitis. No biliary dilatation. Pancreas: Unremarkable Spleen: Unremarkable Adrenals/Urinary Tract: Mild circumferential bladder wall thickening noted. Bilateral renal atrophy and probable bilateral renal cysts identified. There is no evidence of hydronephrosis or urinary calculi. The adrenal  glands are unremarkable. Stomach/Bowel: Stomach is within normal limits. Appendix appears normal. No evidence of bowel wall thickening, distention, or inflammatory changes. A large amount of rectal stool is present. Colonic diverticulosis noted without evidence of diverticulitis Vascular/Lymphatic: Aortic atherosclerosis. No enlarged abdominal or pelvic lymph nodes. Reproductive: Patient is status posthysterectomy. A 4.7 cm left ovarian cyst is present, not significantly changed from 05/24/2011 ultrasound. Other: No ascites, focal collection or pneumoperitoneum. Musculoskeletal: No acute abnormality. IMPRESSION: 1. Mild circumferential bladder wall thickening which may be secondary to cystitis. Correlate clinically. 2. No other acute abnormality. 3. Large amount of rectal stool. 4.  Cholelithiasis without CT evidence of acute cholecystitis 5.  Aortic Atherosclerosis (ICD10-I70.0). Electronically Signed   By: Harmon Pier M.D.   On: 09/07/2017 18:26   Dg Chest 2 View  Result Date: 09/07/2017 CLINICAL DATA:  Unresponsive, tachycardia EXAM: CHEST  2 VIEW COMPARISON:  10/07/2010 FINDINGS: Cardiomegaly. Minimal left base atelectasis. Right lung is clear. No effusions or edema. No acute bony abnormality. IMPRESSION: Cardiomegaly.  Minimal left base atelectasis. Electronically Signed   By: Charlett Nose M.D.   On: 09/07/2017 14:44   Ct Head Wo Contrast  Result Date: 09/07/2017 CLINICAL DATA:  Fall couple of weeks ago. Encephalopathy. Tachycardia. EXAM: CT HEAD WITHOUT CONTRAST TECHNIQUE: Contiguous axial images were obtained from the base of the skull through the vertex without intravenous contrast. COMPARISON:  07/13/2010 FINDINGS: Brain: No evidence of acute infarction, hemorrhage, hydrocephalus, extra-axial collection or mass lesion/mass effect. Small remote right cerebellar infarct. Moderate generalized atrophy without specific pattern. Chronic microvascular ischemic gliosis in the cerebral white matter.  Vascular: Atherosclerotic calcification. Skull: No acute or aggressive finding. Sinuses/Orbits: No acute finding. IMPRESSION: 1. No acute finding. 2. Moderate atrophy. Electronically Signed  By: Marnee SpringJonathon  Watts M.D.   On: 09/07/2017 19:36    Scheduled Meds: . aspirin  300 mg Rectal Daily  . heparin  5,000 Units Subcutaneous Q8H  . polyvinyl alcohol  2 drop Both Eyes TID   Continuous Infusions: . dextrose    . ertapenem Stopped (09/09/17 0552)     LOS: 2 days    Time spent: 35   Kendell BaneSeyed A Colisha Redler, MD [facility] Pager [phone]  If 7PM-7AM, please contact night-coverage [email or web address] Password [email or web address] 09/09/2017, 12:10 PM

## 2017-09-09 NOTE — Progress Notes (Signed)
SLP Cancellation Note  Patient Details Name: Cecilie LowersBertha E Brocious MRN: [id number] DOB: 05/13/1919   Cancelled treatment:       Reason Eval/Treat Not Completed: Other (comment)(note per palliative team, pt may have hospital death- slp to sign off as comfort goal, please reorder if indicated)   Mills KollerKimball, Jionni Helming Ann Jillene Wehrenberg, MS [facility] S[facility] [phone]

## 2017-09-10 DIAGNOSIS — N3001 Acute cystitis with hematuria: Secondary | ICD-10-CM

## 2017-09-10 DIAGNOSIS — N170 Acute kidney failure with tubular necrosis: Secondary | ICD-10-CM

## 2017-09-10 DIAGNOSIS — I471 Supraventricular tachycardia: Secondary | ICD-10-CM

## 2017-09-10 DIAGNOSIS — E875 Hyperkalemia: Secondary | ICD-10-CM

## 2017-09-10 DIAGNOSIS — R310 Gross hematuria: Secondary | ICD-10-CM

## 2017-09-10 DIAGNOSIS — E870 Hyperosmolality and hypernatremia: Secondary | ICD-10-CM

## 2017-09-10 DIAGNOSIS — G92 Toxic encephalopathy: Secondary | ICD-10-CM

## 2017-09-10 DIAGNOSIS — G9341 Metabolic encephalopathy: Secondary | ICD-10-CM

## 2017-09-10 LAB — BASIC METABOLIC PANEL
ANION GAP: 9 (ref 5–15)
BUN: 150 mg/dL — AB (ref 6–20)
CHLORIDE: 113 mmol/L — AB (ref 101–111)
CO2: 15 mmol/L — ABNORMAL LOW (ref 22–32)
Calcium: 8.6 mg/dL — ABNORMAL LOW (ref 8.9–10.3)
Creatinine, Ser: 3.6 mg/dL — ABNORMAL HIGH (ref 0.44–1.00)
GFR, EST AFRICAN AMERICAN: 11 mL/min — AB (ref >60)
GFR, EST NON AFRICAN AMERICAN: 10 mL/min — AB (ref >60)
Glucose, Bld: 168 mg/dL — ABNORMAL HIGH (ref 65–99)
POTASSIUM: 4.2 mmol/L (ref 3.5–5.1)
SODIUM: 137 mmol/L (ref 135–145)

## 2017-09-10 LAB — URINE CULTURE
Culture: 40000 — AB
Special Requests: NORMAL

## 2017-09-10 LAB — CBC
HCT: 28.2 % — ABNORMAL LOW (ref 36.0–46.0)
HEMOGLOBIN: 9 g/dL — AB (ref 12.0–15.0)
MCH: 28.6 pg (ref 26.0–34.0)
MCHC: 31.9 g/dL (ref 30.0–36.0)
MCV: 89.5 fL (ref 78.0–100.0)
PLATELETS: 153 10*3/uL (ref 150–400)
RBC: 3.15 MIL/uL — AB (ref 3.87–5.11)
RDW: 14.6 % (ref 11.5–15.5)
WBC: 9.9 10*3/uL (ref 4.0–10.5)

## 2017-09-10 MED ORDER — MORPHINE SULFATE (PF) 4 MG/ML IV SOLN: 1.0000 mg | INTRAVENOUS | 0 refills | Status: AC | PRN

## 2017-09-10 NOTE — Progress Notes (Signed)
[facility] Liaison:  RN   Received request from Forrestine HimKimberly, LCSW, for family interest in [facility].  Chart reviewed and I spoke with Peyton NajjarLarry, son, at [phone].  He advised was unsure that the family wanted [facility] and that he would like me to speak with Fredrik CoveRoger, the eldest son.  I tried to reach Fredrik CoveRoger at the number on file and the number that was listed in patient's room [phone]. I left a voicemail for him to contact me back to acknowledge referral.   Thank you for this referral.  Adele BarthelAmy Evans, RN, BSN [facility]iaison [phone]  All hospital liaisons are now on AMION.

## 2017-09-10 NOTE — Progress Notes (Signed)
Nutrition Brief Note  Chart reviewed. Pt now transitioning to comfort care.  No further nutrition interventions warranted at this time.  Please re-consult as needed.   Mick Tanguma RD, LDN Clinical Nutrition Pager # - [phone]    

## 2017-09-10 NOTE — Progress Notes (Signed)
A call was made to both sons Sherrie SportRoger Sabic [phone] and Linwood DibblesLarry Gilchrest [phone] with no answer and VM left for them to return my call.

## 2017-09-10 NOTE — Care Management Important Message (Signed)
Important Message  Patient Details  Name: Chelsea [name] MRN: [id number] Date of Birth: 06/30/1919   Medicare Important Message Given:  Yes    Caren MacadamFuller, Brealynn Contino 09/10/2017, 10:00 AMImportant Message  Patient Details  Name: Chelsea [name] MRN: [id number] Date of Birth: 02/28/1919   Medicare Important Message Given:  Yes    Caren MacadamFuller, Jacquel Mccamish 09/10/2017, 9:59 AM

## 2017-09-10 NOTE — Discharge Summary (Signed)
Physician Discharge Summary  Chelsea [name] WUJ:[id number] DOB: Oct 24, 1918 DOA: 09/07/2017  PCP: Kirt Boys, DO  Admit date: 09/07/2017 Discharge date: 09/10/2017  Admitted From: SNF Disposition:   Residential Hospice  Recommendations for Outpatient Follow-up:  1. Follow up with hospice MD at residential hospice.     Discharge Condition:Hospice.  CODE STATUS:Comfort Care Diet recommendation:comfort feeds.   Brief/Interim Summary: The patient is a 83 yo woman who lives in a skilled facility, [name], came in for acute encephalopathy, was found to be in ARF, with drug resistant UTI AND IN SVT. She continues to decline despite treatment with IV fluids, IV antibiotics and she was transitioned comfort care as per the patient's son wishes.     Discharge Diagnoses:  Principal Problem:   Acute renal failure (ARF) (HCC) Active Problems:   Gross hematuria   Hypernatremia   Acute metabolic encephalopathy   Acute cystitis with hematuria   Acidosis   Hyperkalemia   DNR (do not resuscitate)   SVT (supraventricular tachycardia) (HCC)   Demand ischemia (HCC)   Encounter for palliative care   Goals of care, counseling/discussion    Acute Renal Failure, metabolic acidosis , hypernatremia  Secondary to Decreased from po intake, pt has been essentially non responsive. No signs of improvement and hence she was transitioned to comfort care.   SVT resolved.  From volume depletion.    Hyperkalemia: resolved.    Acute cystitis with hematuria:  Was on antibiotics. D/c antibiotics on discharge.    Acute metabolic encephalopathy from ARF, hyperkalemia and cystitis.  Pt unresponsive.     Discharge Instructions  Discharge Instructions    Diet - low sodium heart healthy   Complete by:  As directed    Increase activity slowly   Complete by:  As directed      Allergies as of 09/10/2017   No Known Allergies     Medication List    STOP taking these medications    acetaminophen 325 MG tablet Commonly known as:  TYLENOL   amLODipine 10 MG tablet Commonly known as:  NORVASC   ertapenem 1 g injection Commonly known as:  INVANZ   guaiFENesin-dextromethorphan 100-10 MG/5ML syrup Commonly known as:  ROBITUSSIN DM   multivitamin with minerals Tabs tablet   REFRESH OPTIVE OP   sennosides-docusate sodium 8.6-50 MG tablet Commonly known as:  SENOKOT-S     TAKE these medications   morphine 4 MG/ML injection Inject 0.25 mLs (1 mg total) into the vein every 2 (two) hours as needed for moderate pain or severe pain.       No Known Allergies  Consultations:  Palliative care.    Procedures/Studies: Ct Abdomen Pelvis Wo Contrast  Result Date: 09/07/2017 CLINICAL DATA:  83 year old female with acute abdominal and pelvic pain with swelling. EXAM: CT ABDOMEN AND PELVIS WITHOUT CONTRAST TECHNIQUE: Multidetector CT imaging of the abdomen and pelvis was performed following the standard protocol without IV contrast. COMPARISON:  05/26/2008 CT FINDINGS: Please note that parenchymal abnormalities may be missed without intravenous contrast. Lower chest: No acute abnormality.  Cardiomegaly again identified. Hepatobiliary: The liver is unremarkable. Cholelithiasis identified without CT evidence of acute cholecystitis. No biliary dilatation. Pancreas: Unremarkable Spleen: Unremarkable Adrenals/Urinary Tract: Mild circumferential bladder wall thickening noted. Bilateral renal atrophy and probable bilateral renal cysts identified. There is no evidence of hydronephrosis or urinary calculi. The adrenal glands are unremarkable. Stomach/Bowel: Stomach is within normal limits. Appendix appears normal. No evidence of bowel wall thickening, distention, or inflammatory changes. A large amount  of rectal stool is present. Colonic diverticulosis noted without evidence of diverticulitis Vascular/Lymphatic: Aortic atherosclerosis. No enlarged abdominal or pelvic lymph nodes.  Reproductive: Patient is status posthysterectomy. A 4.7 cm left ovarian cyst is present, not significantly changed from 05/24/2011 ultrasound. Other: No ascites, focal collection or pneumoperitoneum. Musculoskeletal: No acute abnormality. IMPRESSION: 1. Mild circumferential bladder wall thickening which may be secondary to cystitis. Correlate clinically. 2. No other acute abnormality. 3. Large amount of rectal stool. 4.  Cholelithiasis without CT evidence of acute cholecystitis 5.  Aortic Atherosclerosis (ICD10-I70.0). Electronically Signed   By: Harmon Pier M.D.   On: 09/07/2017 18:26   Dg Chest 2 View  Result Date: 09/07/2017 CLINICAL DATA:  Unresponsive, tachycardia EXAM: CHEST  2 VIEW COMPARISON:  10/07/2010 FINDINGS: Cardiomegaly. Minimal left base atelectasis. Right lung is clear. No effusions or edema. No acute bony abnormality. IMPRESSION: Cardiomegaly.  Minimal left base atelectasis. Electronically Signed   By: Charlett Nose M.D.   On: 09/07/2017 14:44   Ct Head Wo Contrast  Result Date: 09/07/2017 CLINICAL DATA:  Fall couple of weeks ago. Encephalopathy. Tachycardia. EXAM: CT HEAD WITHOUT CONTRAST TECHNIQUE: Contiguous axial images were obtained from the base of the skull through the vertex without intravenous contrast. COMPARISON:  07/13/2010 FINDINGS: Brain: No evidence of acute infarction, hemorrhage, hydrocephalus, extra-axial collection or mass lesion/mass effect. Small remote right cerebellar infarct. Moderate generalized atrophy without specific pattern. Chronic microvascular ischemic gliosis in the cerebral white matter. Vascular: Atherosclerotic calcification. Skull: No acute or aggressive finding. Sinuses/Orbits: No acute finding. IMPRESSION: 1. No acute finding. 2. Moderate atrophy. Electronically Signed   By: Marnee Spring M.D.   On: 09/07/2017 19:36       Subjective: Unresponsive.   Discharge Exam: Vitals:   09/09/17 1800 09/09/17 2053  BP: 126/65 (!) 104/54  Pulse: (!)  125 (!) 133  Resp: 14 16  Temp: 98 F (36.7 C) 97.7 F (36.5 C)  SpO2: 98% 98%   Vitals:   09/09/17 1648 09/09/17 1800 09/09/17 2053 09/10/17 0500  BP: (!) 130/93 126/65 (!) 104/54   Pulse: (!) 138 (!) 125 (!) 133   Resp:  14 16   Temp: (!) 97.5 F (36.4 C) 98 F (36.7 C) 97.7 F (36.5 C)   TempSrc: Axillary Axillary Axillary   SpO2: 98% 98% 98%   Weight:    71.2 kg (156 lb 15.5 oz)  Height:        General:unresponsive.  Cardiovascular: s1s2,  Respiratory: diminished at bases. Shallow breathing.  Abdominal: Soft, NT, ND, bowel sounds + Extremities: no edema,     The results of significant diagnostics from this hospitalization (including imaging, microbiology, ancillary and laboratory) are listed below for reference.     Microbiology: Recent Results (from the past 240 hour(s))  Blood Culture (routine x 2)     Status: None (Preliminary result)   Collection Time: 09/07/17  2:45 PM  Result Value Ref Range Status   Specimen Description BLOOD BLOOD LEFT FOREARM  Final   Special Requests   Final    IN PEDIATRIC BOTTLE Blood Culture results may not be optimal due to an excessive volume of blood received in culture bottles   Culture   Final    NO GROWTH 2 DAYS Performed at [facility], [address] [place], [place] [address]    Report Status PENDING  Incomplete  Blood Culture (routine x 2)     Status: None (Preliminary result)   Collection Time: 09/07/17  2:45 PM  Result Value Ref Range Status   Specimen Description BLOOD LEFT ANTECUBITAL  Final   Special Requests   Final    BOTTLES DRAWN AEROBIC AND ANAEROBIC Blood Culture adequate volume   Culture   Final    NO GROWTH 2 DAYS Performed at [facility], [address] [place], [place] [address]    Report Status PENDING  Incomplete  Urine culture     Status: Abnormal   Collection Time: 09/07/17  3:39 PM  Result Value Ref Range Status   Specimen Description URINE, CATHETERIZED  Final   Special  Requests NONE Normal  Final   Culture 40,000 COLONIES/mL PROTEUS MIRABILIS (A)  Final   Report Status 09/10/2017 FINAL  Final   Organism ID, Bacteria PROTEUS MIRABILIS (A)  Final      Susceptibility   Proteus mirabilis - MIC*    AMPICILLIN <=2 SENSITIVE Sensitive     CEFAZOLIN <=4 SENSITIVE Sensitive     CEFTRIAXONE <=1 SENSITIVE Sensitive     CIPROFLOXACIN <=0.25 SENSITIVE Sensitive     GENTAMICIN <=1 SENSITIVE Sensitive     IMIPENEM 4 SENSITIVE Sensitive     NITROFURANTOIN 128 RESISTANT Resistant     TRIMETH/SULFA <=20 SENSITIVE Sensitive     AMPICILLIN/SULBACTAM <=2 SENSITIVE Sensitive     PIP/TAZO <=4 SENSITIVE Sensitive     * 40,000 COLONIES/mL PROTEUS MIRABILIS  MRSA PCR Screening     Status: None   Collection Time: 09/08/17  1:48 AM  Result Value Ref Range Status   MRSA by PCR NEGATIVE NEGATIVE Final    Comment:        The GeneXpert MRSA Assay (FDA approved for NASAL specimens only), is one component of a comprehensive MRSA colonization surveillance program. It is not intended to diagnose MRSA infection nor to guide or monitor treatment for MRSA infections.      Labs: BNP (last 3 results) No results for input(s): BNP in the last 8760 hours. Basic Metabolic Panel: Recent Labs  Lab 09/07/17 2230 09/08/17 0224 09/08/17 0849 09/08/17 1223 09/09/17 0824 09/10/17 0457  NA 155* 152*  153* 148* 147* 144 137  K 4.4 4.7  4.7 4.5 4.4 4.1 4.2  CL >130* 128*  127* 123* 123* 122* 113*  CO2 16* 18*  17* 16* 16* 14* 15*  GLUCOSE 147* 208*  209* 218* 200* 178* 168*  BUN 175* 116*  116* 168* 164* 170* 150*  CREATININE 4.06* 4.05*  4.17* 4.01* 4.11* 3.83* 3.60*  CALCIUM 8.7* 9.1  9.2 9.0 8.9 8.7* 8.6*  PHOS 5.2* 5.9* 5.3* 5.5*  --   --    Liver Function Tests: Recent Labs  Lab 09/07/17 1407 09/07/17 2230 09/08/17 0224 09/08/17 0849 09/08/17 1223 09/09/17 0824  AST 26  --   --   --   --  24  ALT 13*  --   --   --   --  12*  ALKPHOS 113  --   --   --   --   81  BILITOT 0.4  --   --   --   --  0.4  PROT 7.7  --   --   --   --  5.4*  ALBUMIN 3.7 2.9* 2.7* 2.8* 2.8* 2.8*   No results for input(s): LIPASE, AMYLASE in the last 168 hours. No results for input(s): AMMONIA in the last 168 hours. CBC: Recent Labs  Lab 09/07/17 1407 09/07/17 1445 09/08/17 0224 09/09/17 0824 09/10/17 0457  WBC 11.6* 11.4* 11.4* 8.4  9.9  NEUTROABS  --  10.2*  --  6.8  --   HGB 12.7 12.6 10.1* 9.1* 9.0*  HCT 40.8 41.0 33.0* 29.8* 28.2*  MCV 91.7 91.7 93.0 91.1 89.5  PLT 308 309 238 153 153   Cardiac Enzymes: No results for input(s): CKTOTAL, CKMB, CKMBINDEX, TROPONINI in the last 168 hours. BNP: Invalid input(s): POCBNP CBG: No results for input(s): GLUCAP in the last 168 hours. D-Dimer No results for input(s): DDIMER in the last 72 hours. Hgb A1c No results for input(s): HGBA1C in the last 72 hours. Lipid Profile No results for input(s): CHOL, HDL, LDLCALC, TRIG, CHOLHDL, LDLDIRECT in the last 72 hours. Thyroid function studies No results for input(s): TSH, T4TOTAL, T3FREE, THYROIDAB in the last 72 hours.  Invalid input(s): FREET3 Anemia work up No results for input(s): VITAMINB12, FOLATE, FERRITIN, TIBC, IRON, RETICCTPCT in the last 72 hours. Urinalysis    Component Value Date/Time   COLORURINE BROWN (A) 09/07/2017 1445   APPEARANCEUR TURBID (A) 09/07/2017 1445   LABSPEC 1.015 09/07/2017 1445   PHURINE 8.5 (H) 09/07/2017 1445   GLUCOSEU NEGATIVE 09/07/2017 1445   HGBUR LARGE (A) 09/07/2017 1445   BILIRUBINUR NEGATIVE 09/07/2017 1445   KETONESUR TRACE (A) 09/07/2017 1445   PROTEINUR 100 (A) 09/07/2017 1445   UROBILINOGEN 0.2 05/19/2011 1829   NITRITE NEGATIVE 09/07/2017 1445   LEUKOCYTESUR LARGE (A) 09/07/2017 1445   Sepsis Labs Invalid input(s): PROCALCITONIN,  WBC,  LACTICIDVEN Microbiology Recent Results (from the past 240 hour(s))  Blood Culture (routine x 2)     Status: None (Preliminary result)   Collection Time: 09/07/17  2:45 PM   Result Value Ref Range Status   Specimen Description BLOOD BLOOD LEFT FOREARM  Final   Special Requests   Final    IN PEDIATRIC BOTTLE Blood Culture results may not be optimal due to an excessive volume of blood received in culture bottles   Culture   Final    NO GROWTH 2 DAYS Performed at [facility], [address] [place], [place] [address]    Report Status PENDING  Incomplete  Blood Culture (routine x 2)     Status: None (Preliminary result)   Collection Time: 09/07/17  2:45 PM  Result Value Ref Range Status   Specimen Description BLOOD LEFT ANTECUBITAL  Final   Special Requests   Final    BOTTLES DRAWN AEROBIC AND ANAEROBIC Blood Culture adequate volume   Culture   Final    NO GROWTH 2 DAYS Performed at [facility], [address] [place], [place] [address]    Report Status PENDING  Incomplete  Urine culture     Status: Abnormal   Collection Time: 09/07/17  3:39 PM  Result Value Ref Range Status   Specimen Description URINE, CATHETERIZED  Final   Special Requests NONE Normal  Final   Culture 40,000 COLONIES/mL PROTEUS MIRABILIS (A)  Final   Report Status 09/10/2017 FINAL  Final   Organism ID, Bacteria PROTEUS MIRABILIS (A)  Final      Susceptibility   Proteus mirabilis - MIC*    AMPICILLIN <=2 SENSITIVE Sensitive     CEFAZOLIN <=4 SENSITIVE Sensitive     CEFTRIAXONE <=1 SENSITIVE Sensitive     CIPROFLOXACIN <=0.25 SENSITIVE Sensitive     GENTAMICIN <=1 SENSITIVE Sensitive     IMIPENEM 4 SENSITIVE Sensitive     NITROFURANTOIN 128 RESISTANT Resistant     TRIMETH/SULFA <=20 SENSITIVE Sensitive     AMPICILLIN/SULBACTAM <=2 SENSITIVE  Sensitive     PIP/TAZO <=4 SENSITIVE Sensitive     * 40,000 COLONIES/mL PROTEUS MIRABILIS  MRSA PCR Screening     Status: None   Collection Time: 09/08/17  1:48 AM  Result Value Ref Range Status   MRSA by PCR NEGATIVE NEGATIVE Final    Comment:        The GeneXpert MRSA Assay (FDA approved for NASAL specimens only), is  one component of a comprehensive MRSA colonization surveillance program. It is not intended to diagnose MRSA infection nor to guide or monitor treatment for MRSA infections.      Time coordinating discharge: Over 30 minutes  SIGNED:   Kathlen ModyVijaya Floreen Teegarden, MD  [facility] 09/10/2017, 10:51 AM Pager   If 7PM-7AM, please contact night-coverage [email or web address] Password [email or web address]

## 2017-09-10 NOTE — Progress Notes (Signed)
CSW informed by patient's attending MD that patient's family is interested in residential hospice. Per chart review patient's son spoke with  Palliative MD and is agreeable to residential hospice. CSW contacted patient's son Chelsea [name](Chelsea [name] [phone]) who confirmed interest in residential hospice. Patient's [facility] reported interest in [facility]. Patient's son reported that he was about to lose signal and that [facility] can contact his younger brother Chelsea [name](Chelsea [name]).  CSW [facility].   Hospice Liaison reported that they were unable to reach patient's son Chelsea [name](Chelsea [name]). Hospice Liaison reported that they contacted patient's other son Chelsea [name](Chelsea [name] [name]) who reported that he was unsure about residential hospice.   CSW attempted to contact patient's son Chelsea [name](Chelsea [name] [phone]), no answer.   CSW attempted to contact patient's son Chelsea [name](Chelsea [name] [phone]) twice, no answer. CSW left voicemail requesting return phone call.   CSW will continue to try and reach patient's family regarding hospice referral.   Celso SickleKimberly Jaystin Mcgarvey, [place] Clinical Social Worker [facility] Cell#: [phone]

## 2017-09-11 LAB — BASIC METABOLIC PANEL
Anion gap: 8 (ref 5–15)
BUN: 135 mg/dL — AB (ref 6–20)
CHLORIDE: 115 mmol/L — AB (ref 101–111)
CO2: 15 mmol/L — ABNORMAL LOW (ref 22–32)
Calcium: 8.7 mg/dL — ABNORMAL LOW (ref 8.9–10.3)
Creatinine, Ser: 3.3 mg/dL — ABNORMAL HIGH (ref 0.44–1.00)
GFR calc Af Amer: 13 mL/min — ABNORMAL LOW (ref >60)
GFR, EST NON AFRICAN AMERICAN: 11 mL/min — AB (ref >60)
GLUCOSE: 89 mg/dL (ref 65–99)
POTASSIUM: 4.2 mmol/L (ref 3.5–5.1)
Sodium: 138 mmol/L (ref 135–145)

## 2017-09-11 LAB — CBC
HCT: 28.9 % — ABNORMAL LOW (ref 36.0–46.0)
Hemoglobin: 9.3 g/dL — ABNORMAL LOW (ref 12.0–15.0)
MCH: 28.2 pg (ref 26.0–34.0)
MCHC: 32.2 g/dL (ref 30.0–36.0)
MCV: 87.6 fL (ref 78.0–100.0)
PLATELETS: 134 10*3/uL — AB (ref 150–400)
RBC: 3.3 MIL/uL — AB (ref 3.87–5.11)
RDW: 14.3 % (ref 11.5–15.5)
WBC: 8.3 10*3/uL (ref 4.0–10.5)

## 2017-09-11 NOTE — Progress Notes (Signed)
CSW spoke with patient's son Linwood Dibbles(Larry Lewellen) who confirmed plan for patient to [facility]. Patient's son reported that he will come to the hospital to do paperwork.   Per chart review, patient's son completed paperwork and patient ready to transfer.  CSW faxed [facility]'s discharge summary to [facility].  [facility] contacted, patient's family notified. Patient's RN reported that report has been called, packet complete. CSW signing off, no other needs identified.  Celso SickleKimberly Tarick Parenteau, [place] Clinical Social Worker [facility] Cell#: [phone]

## 2017-09-11 NOTE — Progress Notes (Signed)
[facility] Liaison: RN visit  Received request from for family interest in [facility] with request for transfer today. Chart reviewed. Met with son Fritz Pickerel to confirm interest and explain services. Family agreeable to transfer today. CSW aware. Registration paper work completed. Dr. Orpah Melter to assume care per family request. Please fax discharge summary to [phone]. RN please call report to [phone]. Please arrange transport for patient to arrive as soon as possible.   Please call with any hospice related questions.   Thank you,  Farrel Gordon, RN, [facility] Liaison [phone]   All hospital liaisons are on [place].

## 2017-09-11 NOTE — Progress Notes (Signed)
Pt's son Chelsea [name] called this morning. Explained to Chelsea [name] that his mother was discharged yesterday. Encouraged Chelsea [name] to call CSW and Hospice. Chelsea [name] states that Hospice rep told him to [facility]. Explained to Chelsea [name] that pt is discharged, insurance will not pay for continued stay in this acute setting. Encouraged Larry(pt's son) to call CSW for placement, that this was very important.

## 2017-09-11 NOTE — Discharge Summary (Addendum)
Physician Discharge Summary  Chelsea LowersBertha E [name] ZOX:[id number] DOB: 05/22/1919 DOA: 09/07/2017  PCP: Kirt Boysarter, Monica, DO  Admit date: 09/07/2017 Discharge date: 09/11/2017  Admitted From: SNF Disposition:   Residential Hospice  Recommendations for Outpatient Follow-up:  1. Follow up with hospice MD at residential hospice.     Discharge Condition:Hospice.  CODE STATUS:Comfort Care Diet recommendation:comfort feeds.   Brief/Interim Summary: The patient is a 83 yo woman who lives in a skilled facility, [name], came in for acute encephalopathy, was found to be in ARF, with drug resistant UTI AND IN SVT. She continues to decline despite treatment with IV fluids, IV antibiotics and she was transitioned comfort care as per the patient's son wishes.     Discharge Diagnoses:  Principal Problem:   Acute renal failure (ARF) (HCC) Active Problems:   Gross hematuria   Hypernatremia   Acute metabolic encephalopathy   Acute cystitis with hematuria   Acidosis   Hyperkalemia   DNR (do not resuscitate)   SVT (supraventricular tachycardia) (HCC)   Demand ischemia (HCC)   Encounter for palliative care   Goals of care, counseling/discussion    Acute Renal Failure on stage 4 CKD. , metabolic acidosis , hypernatremia  Secondary to Decreased from po intake, pt has been essentially non responsive. No signs of improvement and hence she was transitioned to comfort care.   SVT resolved.  From volume depletion.    Hyperkalemia: resolved.    Acute cystitis with hematuria:  Was on antibiotics. D/c antibiotics on discharge.    Acute metabolic encephalopathy from ARF, hyperkalemia and cystitis.  Pt unresponsive.     Discharge Instructions  Discharge Instructions    Diet - low sodium heart healthy   Complete by:  As directed    Increase activity slowly   Complete by:  As directed      Allergies as of 09/11/2017   No Known Allergies     Medication List    STOP taking these  medications   acetaminophen 325 MG tablet Commonly known as:  TYLENOL   amLODipine 10 MG tablet Commonly known as:  NORVASC   ertapenem 1 g injection Commonly known as:  INVANZ   guaiFENesin-dextromethorphan 100-10 MG/5ML syrup Commonly known as:  ROBITUSSIN DM   multivitamin with minerals Tabs tablet   REFRESH OPTIVE OP   sennosides-docusate sodium 8.6-50 MG tablet Commonly known as:  SENOKOT-S     TAKE these medications   morphine 4 MG/ML injection Inject 0.25 mLs (1 mg total) into the vein every 2 (two) hours as needed for moderate pain or severe pain.       No Known Allergies  Consultations:  Palliative care.    Procedures/Studies: Ct Abdomen Pelvis Wo Contrast  Result Date: 09/07/2017 CLINICAL DATA:  83 year old female with acute abdominal and pelvic pain with swelling. EXAM: CT ABDOMEN AND PELVIS WITHOUT CONTRAST TECHNIQUE: Multidetector CT imaging of the abdomen and pelvis was performed following the standard protocol without IV contrast. COMPARISON:  05/26/2008 CT FINDINGS: Please note that parenchymal abnormalities may be missed without intravenous contrast. Lower chest: No acute abnormality.  Cardiomegaly again identified. Hepatobiliary: The liver is unremarkable. Cholelithiasis identified without CT evidence of acute cholecystitis. No biliary dilatation. Pancreas: Unremarkable Spleen: Unremarkable Adrenals/Urinary Tract: Mild circumferential bladder wall thickening noted. Bilateral renal atrophy and probable bilateral renal cysts identified. There is no evidence of hydronephrosis or urinary calculi. The adrenal glands are unremarkable. Stomach/Bowel: Stomach is within normal limits. Appendix appears normal. No evidence of bowel wall thickening, distention, or  inflammatory changes. A large amount of rectal stool is present. Colonic diverticulosis noted without evidence of diverticulitis Vascular/Lymphatic: Aortic atherosclerosis. No enlarged abdominal or pelvic lymph  nodes. Reproductive: Patient is status posthysterectomy. A 4.7 cm left ovarian cyst is present, not significantly changed from 05/24/2011 ultrasound. Other: No ascites, focal collection or pneumoperitoneum. Musculoskeletal: No acute abnormality. IMPRESSION: 1. Mild circumferential bladder wall thickening which may be secondary to cystitis. Correlate clinically. 2. No other acute abnormality. 3. Large amount of rectal stool. 4.  Cholelithiasis without CT evidence of acute cholecystitis 5.  Aortic Atherosclerosis (ICD10-I70.0). Electronically Signed   By: Harmon Pier M.D.   On: 09/07/2017 18:26   Dg Chest 2 View  Result Date: 09/07/2017 CLINICAL DATA:  Unresponsive, tachycardia EXAM: CHEST  2 VIEW COMPARISON:  10/07/2010 FINDINGS: Cardiomegaly. Minimal left base atelectasis. Right lung is clear. No effusions or edema. No acute bony abnormality. IMPRESSION: Cardiomegaly.  Minimal left base atelectasis. Electronically Signed   By: Charlett Nose M.D.   On: 09/07/2017 14:44   Ct Head Wo Contrast  Result Date: 09/07/2017 CLINICAL DATA:  Fall couple of weeks ago. Encephalopathy. Tachycardia. EXAM: CT HEAD WITHOUT CONTRAST TECHNIQUE: Contiguous axial images were obtained from the base of the skull through the vertex without intravenous contrast. COMPARISON:  07/13/2010 FINDINGS: Brain: No evidence of acute infarction, hemorrhage, hydrocephalus, extra-axial collection or mass lesion/mass effect. Small remote right cerebellar infarct. Moderate generalized atrophy without specific pattern. Chronic microvascular ischemic gliosis in the cerebral white matter. Vascular: Atherosclerotic calcification. Skull: No acute or aggressive finding. Sinuses/Orbits: No acute finding. IMPRESSION: 1. No acute finding. 2. Moderate atrophy. Electronically Signed   By: Marnee Spring M.D.   On: 09/07/2017 19:36      Subjective: Unresponsive.   Discharge Exam: Vitals:   09/10/17 2034 09/11/17 1041  BP: (!) 124/54 (!) 135/49   Pulse: 80 97  Resp: 12 13  Temp: 98.1 F (36.7 C) 98.9 F (37.2 C)  SpO2: 99% 99%   Vitals:   09/10/17 1158 09/10/17 2034 09/11/17 0432 09/11/17 1041  BP:  (!) 124/54  (!) 135/49  Pulse:  80  97  Resp: 10 12  13   Temp:  98.1 F (36.7 C)  98.9 F (37.2 C)  TempSrc:  Axillary  Axillary  SpO2:  99%  99%  Weight:   71.5 kg (157 lb 10.1 oz)   Height:        General:unresponsive.  Cardiovascular: s1s2,  Respiratory: diminished at bases. Shallow breathing.  Abdominal: Soft, NT, ND, bowel sounds + Extremities: no edema,     The results of significant diagnostics from this hospitalization (including imaging, microbiology, ancillary and laboratory) are listed below for reference.     Microbiology: Recent Results (from the past 240 hour(s))  Blood Culture (routine x 2)     Status: None (Preliminary result)   Collection Time: 09/07/17  2:45 PM  Result Value Ref Range Status   Specimen Description BLOOD BLOOD LEFT FOREARM  Final   Special Requests   Final    IN PEDIATRIC BOTTLE Blood Culture results may not be optimal due to an excessive volume of blood received in culture bottles   Culture   Final    NO GROWTH 3 DAYS Performed at [facility], [address], [place], [place] [address]    Report Status PENDING  Incomplete  Blood Culture (routine x 2)     Status: None (Preliminary result)   Collection Time: 09/07/17  2:45 PM  Result Value Ref  Range Status   Specimen Description BLOOD LEFT ANTECUBITAL  Final   Special Requests   Final    BOTTLES DRAWN AEROBIC AND ANAEROBIC Blood Culture adequate volume   Culture   Final    NO GROWTH 3 DAYS Performed at [facility], [address], [place], [place] [address]    Report Status PENDING  Incomplete  Urine culture     Status: Abnormal   Collection Time: 09/07/17  3:39 PM  Result Value Ref Range Status   Specimen Description URINE, CATHETERIZED  Final   Special Requests NONE Normal  Final   Culture 40,000  COLONIES/mL PROTEUS MIRABILIS (A)  Final   Report Status 09/10/2017 FINAL  Final   Organism ID, Bacteria PROTEUS MIRABILIS (A)  Final      Susceptibility   Proteus mirabilis - MIC*    AMPICILLIN <=2 SENSITIVE Sensitive     CEFAZOLIN <=4 SENSITIVE Sensitive     CEFTRIAXONE <=1 SENSITIVE Sensitive     CIPROFLOXACIN <=0.25 SENSITIVE Sensitive     GENTAMICIN <=1 SENSITIVE Sensitive     IMIPENEM 4 SENSITIVE Sensitive     NITROFURANTOIN 128 RESISTANT Resistant     TRIMETH/SULFA <=20 SENSITIVE Sensitive     AMPICILLIN/SULBACTAM <=2 SENSITIVE Sensitive     PIP/TAZO <=4 SENSITIVE Sensitive     * 40,000 COLONIES/mL PROTEUS MIRABILIS  MRSA PCR Screening     Status: None   Collection Time: 09/08/17  1:48 AM  Result Value Ref Range Status   MRSA by PCR NEGATIVE NEGATIVE Final    Comment:        The GeneXpert MRSA Assay (FDA approved for NASAL specimens only), is one component of a comprehensive MRSA colonization surveillance program. It is not intended to diagnose MRSA infection nor to guide or monitor treatment for MRSA infections.      Labs: BNP (last 3 results) No results for input(s): BNP in the last 8760 hours. Basic Metabolic Panel: Recent Labs  Lab 09/07/17 2230 09/08/17 0224 09/08/17 0849 09/08/17 1223 09/09/17 0824 09/10/17 0457 09/11/17 0416  NA 155* 152*  153* 148* 147* 144 137 138  K 4.4 4.7  4.7 4.5 4.4 4.1 4.2 4.2  CL >130* 128*  127* 123* 123* 122* 113* 115*  CO2 16* 18*  17* 16* 16* 14* 15* 15*  GLUCOSE 147* 208*  209* 218* 200* 178* 168* 89  BUN 175* 116*  116* 168* 164* 170* 150* 135*  CREATININE 4.06* 4.05*  4.17* 4.01* 4.11* 3.83* 3.60* 3.30*  CALCIUM 8.7* 9.1  9.2 9.0 8.9 8.7* 8.6* 8.7*  PHOS 5.2* 5.9* 5.3* 5.5*  --   --   --    Liver Function Tests: Recent Labs  Lab 09/07/17 1407 09/07/17 2230 09/08/17 0224 09/08/17 0849 09/08/17 1223 09/09/17 0824  AST 26  --   --   --   --  24  ALT 13*  --   --   --   --  12*  ALKPHOS 113  --   --    --   --  81  BILITOT 0.4  --   --   --   --  0.4  PROT 7.7  --   --   --   --  5.4*  ALBUMIN 3.7 2.9* 2.7* 2.8* 2.8* 2.8*   No results for input(s): LIPASE, AMYLASE in the last 168 hours. No results for input(s): AMMONIA in the last 168 hours. CBC: Recent Labs  Lab 09/07/17 1445 09/08/17 0224 09/09/17 6045  09/10/17 0457 09/11/17 0416  WBC 11.4* 11.4* 8.4 9.9 8.3  NEUTROABS 10.2*  --  6.8  --   --   HGB 12.6 10.1* 9.1* 9.0* 9.3*  HCT 41.0 33.0* 29.8* 28.2* 28.9*  MCV 91.7 93.0 91.1 89.5 87.6  PLT 309 238 153 153 134*   Cardiac Enzymes: No results for input(s): CKTOTAL, CKMB, CKMBINDEX, TROPONINI in the last 168 hours. BNP: Invalid input(s): POCBNP CBG: No results for input(s): GLUCAP in the last 168 hours. D-Dimer No results for input(s): DDIMER in the last 72 hours. Hgb A1c No results for input(s): HGBA1C in the last 72 hours. Lipid Profile No results for input(s): CHOL, HDL, LDLCALC, TRIG, CHOLHDL, LDLDIRECT in the last 72 hours. Thyroid function studies No results for input(s): TSH, T4TOTAL, T3FREE, THYROIDAB in the last 72 hours.  Invalid input(s): FREET3 Anemia work up No results for input(s): VITAMINB12, FOLATE, FERRITIN, TIBC, IRON, RETICCTPCT in the last 72 hours. Urinalysis    Component Value Date/Time   COLORURINE BROWN (A) 09/07/2017 1445   APPEARANCEUR TURBID (A) 09/07/2017 1445   LABSPEC 1.015 09/07/2017 1445   PHURINE 8.5 (H) 09/07/2017 1445   GLUCOSEU NEGATIVE 09/07/2017 1445   HGBUR LARGE (A) 09/07/2017 1445   BILIRUBINUR NEGATIVE 09/07/2017 1445   KETONESUR TRACE (A) 09/07/2017 1445   PROTEINUR 100 (A) 09/07/2017 1445   UROBILINOGEN 0.2 05/19/2011 1829   NITRITE NEGATIVE 09/07/2017 1445   LEUKOCYTESUR LARGE (A) 09/07/2017 1445   Sepsis Labs Invalid input(s): PROCALCITONIN,  WBC,  LACTICIDVEN Microbiology Recent Results (from the past 240 hour(s))  Blood Culture (routine x 2)     Status: None (Preliminary result)   Collection Time: 09/07/17   2:45 PM  Result Value Ref Range Status   Specimen Description BLOOD BLOOD LEFT FOREARM  Final   Special Requests   Final    IN PEDIATRIC BOTTLE Blood Culture results may not be optimal due to an excessive volume of blood received in culture bottles   Culture   Final    NO GROWTH 3 DAYS Performed at [facility], [address], [place], [place] [address]    Report Status PENDING  Incomplete  Blood Culture (routine x 2)     Status: None (Preliminary result)   Collection Time: 09/07/17  2:45 PM  Result Value Ref Range Status   Specimen Description BLOOD LEFT ANTECUBITAL  Final   Special Requests   Final    BOTTLES DRAWN AEROBIC AND ANAEROBIC Blood Culture adequate volume   Culture   Final    NO GROWTH 3 DAYS Performed at [facility], [address] [place], [place] [address]    Report Status PENDING  Incomplete  Urine culture     Status: Abnormal   Collection Time: 09/07/17  3:39 PM  Result Value Ref Range Status   Specimen Description URINE, CATHETERIZED  Final   Special Requests NONE Normal  Final   Culture 40,000 COLONIES/mL PROTEUS MIRABILIS (A)  Final   Report Status 09/10/2017 FINAL  Final   Organism ID, Bacteria PROTEUS MIRABILIS (A)  Final      Susceptibility   Proteus mirabilis - MIC*    AMPICILLIN <=2 SENSITIVE Sensitive     CEFAZOLIN <=4 SENSITIVE Sensitive     CEFTRIAXONE <=1 SENSITIVE Sensitive     CIPROFLOXACIN <=0.25 SENSITIVE Sensitive     GENTAMICIN <=1 SENSITIVE Sensitive     IMIPENEM 4 SENSITIVE Sensitive     NITROFURANTOIN 128 RESISTANT Resistant     TRIMETH/SULFA <=  20 SENSITIVE Sensitive     AMPICILLIN/SULBACTAM <=2 SENSITIVE Sensitive     PIP/TAZO <=4 SENSITIVE Sensitive     * 40,000 COLONIES/mL PROTEUS MIRABILIS  MRSA PCR Screening     Status: None   Collection Time: 09/08/17  1:48 AM  Result Value Ref Range Status   MRSA by PCR NEGATIVE NEGATIVE Final    Comment:        The GeneXpert MRSA Assay (FDA approved for NASAL  specimens only), is one component of a comprehensive MRSA colonization surveillance program. It is not intended to diagnose MRSA infection nor to guide or monitor treatment for MRSA infections.      Time coordinating discharge: Over 30 minutes  SIGNED:   Kathlen ModyVijaya Breeze Angell, MD  [facility] 09/11/2017, 12:15 PM Pager   If 7PM-7AM, please contact night-coverage [email or web address] Password [email or web address]

## 2017-09-11 NOTE — Progress Notes (Signed)
[facility]. Patient's IV removed. [facility]

## 2017-09-12 LAB — CULTURE, BLOOD (ROUTINE X 2)
Culture: NO GROWTH
Culture: NO GROWTH
SPECIAL REQUESTS: ADEQUATE

## 2017-10-08 DEATH — deceased

## 2017-11-06 IMAGING — CT CT KNEE*L* W/O CM
3 series · 13 of 33 positions shown, 16 images · non-contrast
Comparison: Radiograph same date

CLINICAL DATA: Patient fell last night. Left hip and left knee
pain.

EXAM:
CT OF THE LEFT KNEE WITHOUT CONTRAST
TECHNIQUE: Multidetector CT imaging of the left knee was performed according to
the standard protocol. Multiplanar CT image reconstructions were
also generated.

[Series 8: axial st · axial · 0.29mm/px · z∈[-387,-249]mm · 5 of 134 slices shown, 7 images]
[im 21/134  soft-tissue]
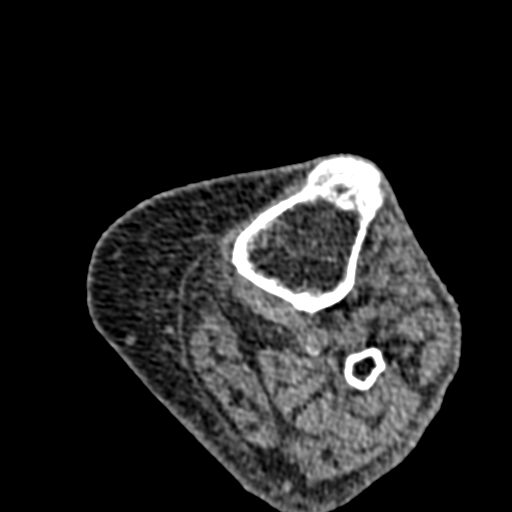
[im 21/134  bone]
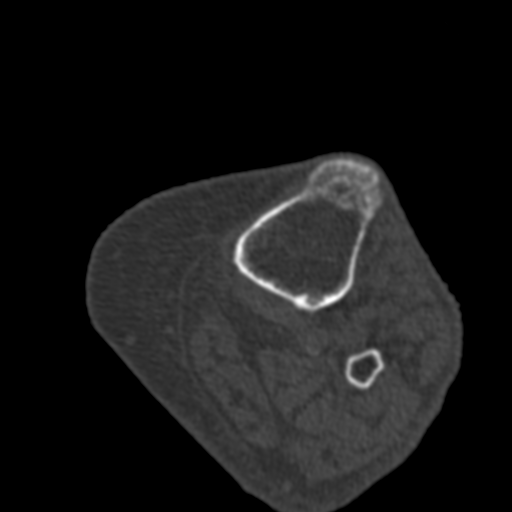
[im 41/134  bone]
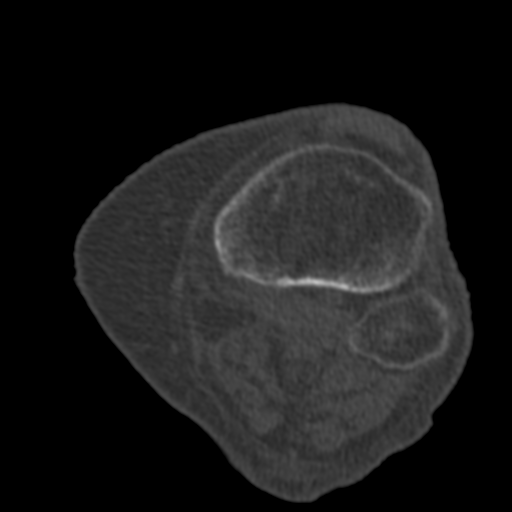
[im 72/134  bone]
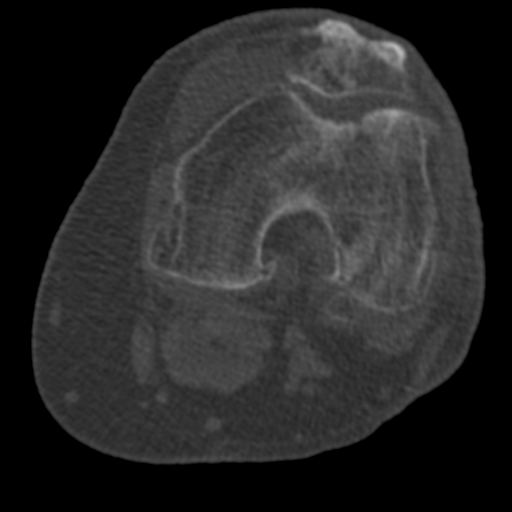
[im 93/134  bone]
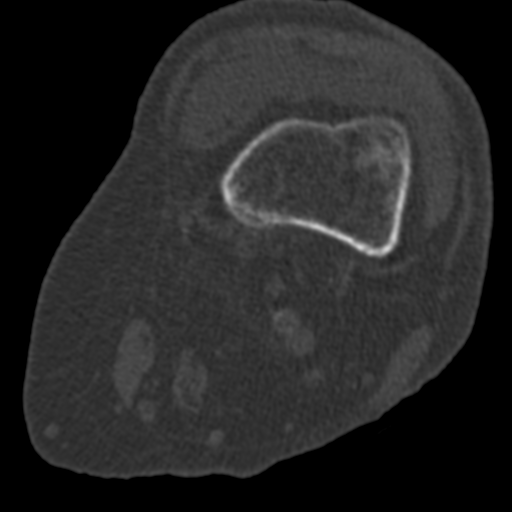
[im 113/134  soft-tissue]
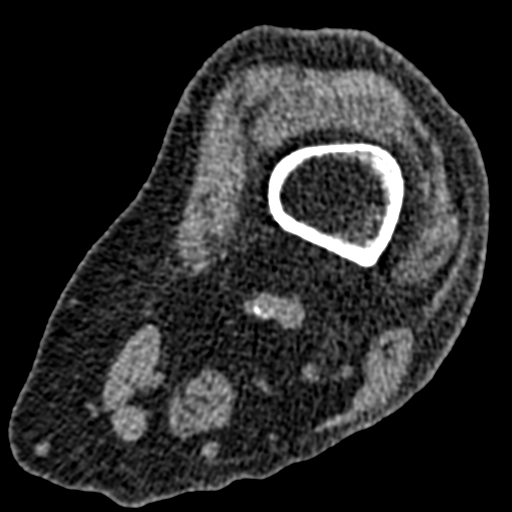
[im 113/134  bone]
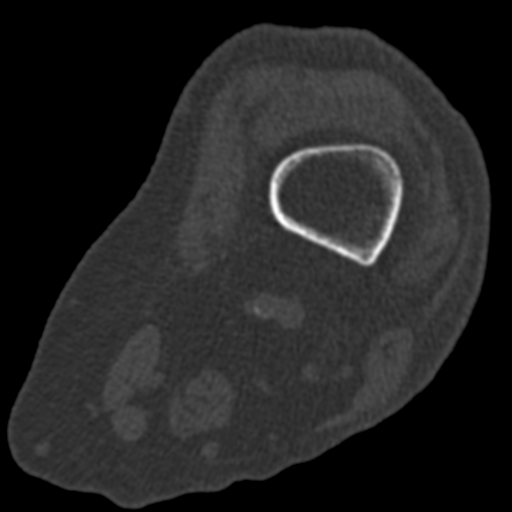

[Series 9: coronal st · coronal · 0.28mm/px · 3 of 98 slices shown]
[im 20/98  bone]
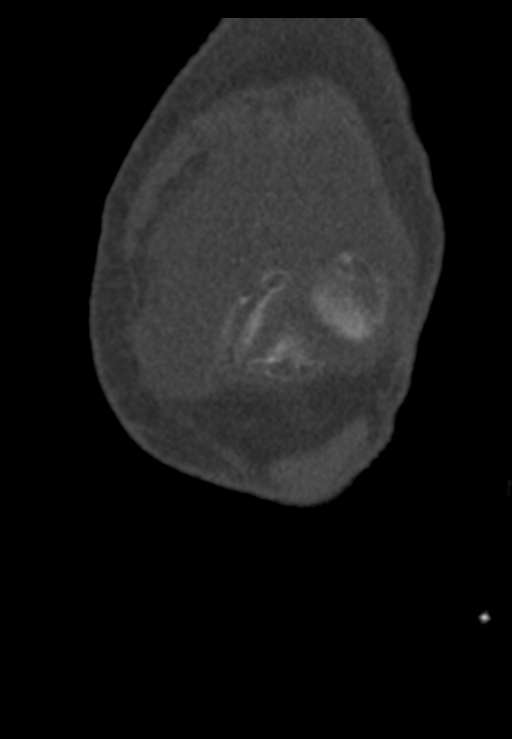
[im 39/98  bone]
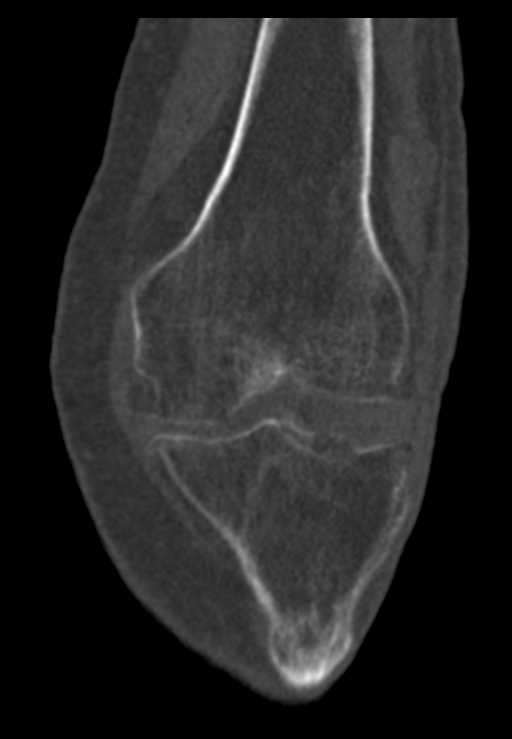
[im 59/98  bone]
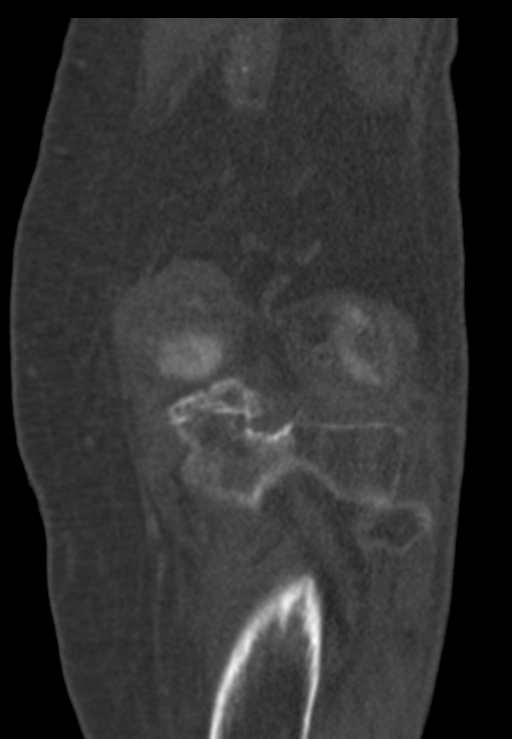

[Series 10: sagittal st · sagittal · 0.31mm/px · 5 of 84 slices shown, 6 images]
[im 28/84  bone]
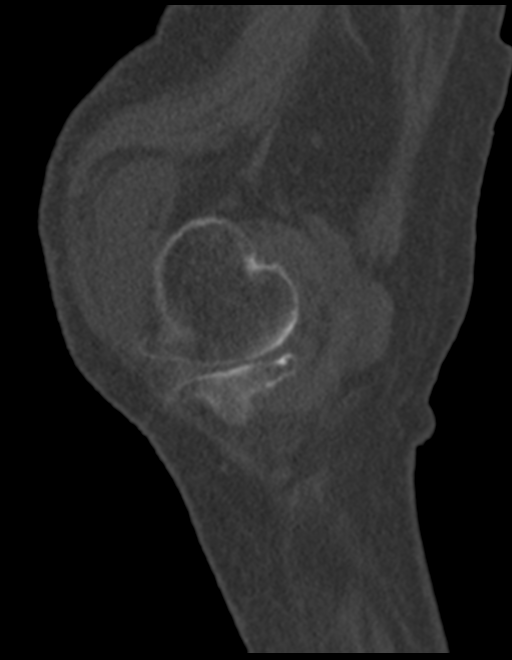
[im 35/84  bone]
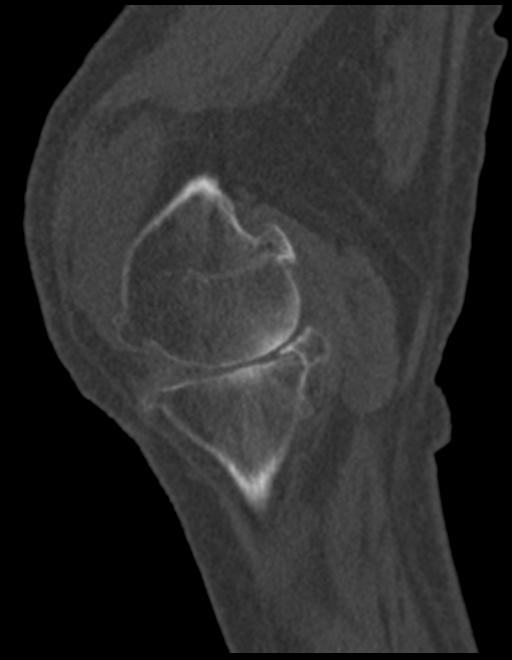
[im 42/84  soft-tissue]
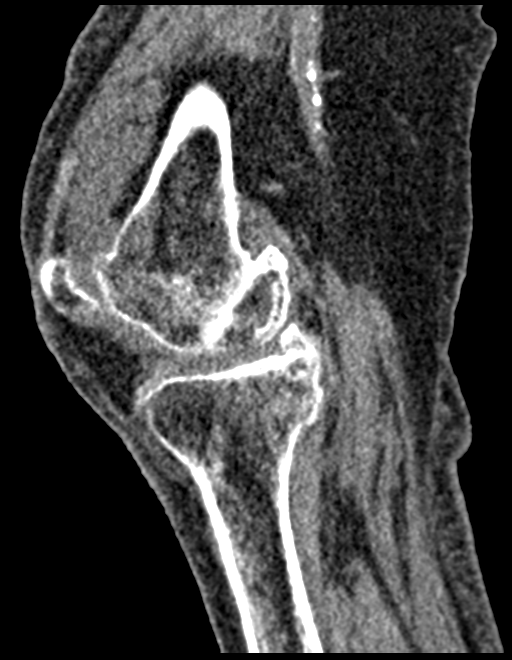
[im 42/84  bone]
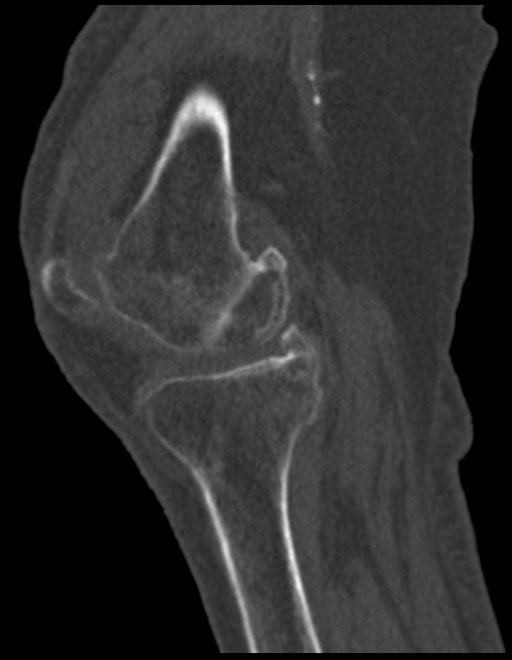
[im 49/84  bone]
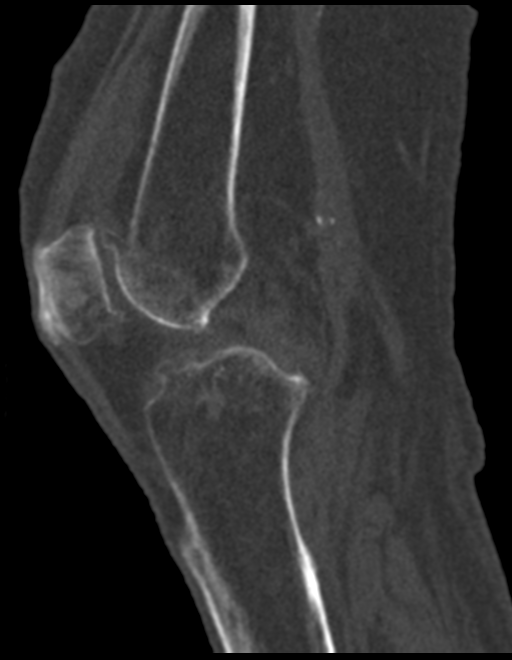
[im 56/84  bone]
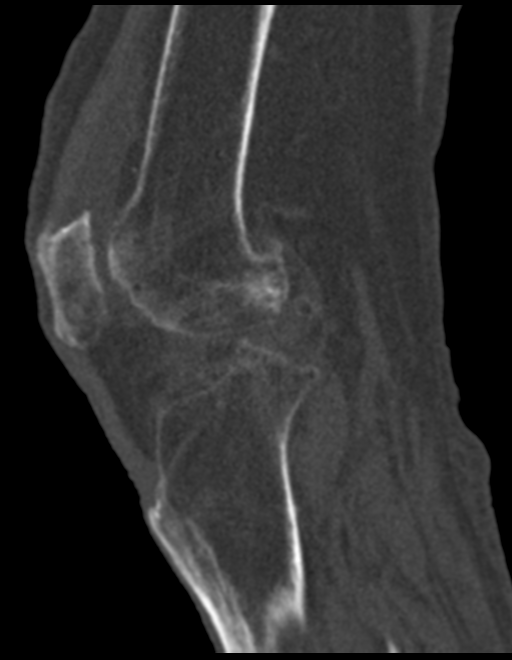

[13 of 33 positions shown; findings below may reference images not displayed]

FINDINGS: Bones/Joint/Cartilage

The bones are severely demineralized. No evidence of displaced
fracture or dislocation. There is a possible nondisplaced linear
fracture involving the posterior aspect of the medial tibial
plateau, best seen on axial image 75 and sagittal images 42-45.
There is no depression of the articular surface. This is situated in
the coronal plane. There are tricompartmental degenerative changes
which are most advanced within the medial compartment. There is
apparent ossification of the posterior horn of the medial meniscus
which may be partially incorporated into the posterior aspect of the
medial tibial plateau.

Moderate-sized intermediate density knee joint effusion without
layering intra-articular fat.

Ligaments

Suboptimally assessed by CT.

Muscles and Tendons

Intact extensor mechanism.  No focal muscular abnormalities.

Soft tissues

Extensive femoral popliteal atherosclerosis.  Small Baker's cyst.
IMPRESSION: 1. Possible nondisplaced intra-articular fracture of the medial
tibial plateau posteriorly. Detail limited by severe osteopenia.
2. Moderate-sized joint effusion, probable hemarthrosis.
3. Advanced tricompartmental degenerative changes, most advanced in
the medial compartment.

## 2017-11-06 IMAGING — CR DG KNEE COMPLETE 4+V*L*
4 series · 4 of 4 positions shown · non-contrast
Comparison: None.

CLINICAL DATA: Pt fell in the bathroom last night when she got up
to urinate. Pt complained of left knee and left hip pain. EMS was
called but patient refused care last night. This morning, pt still
complained of pain and the staff called EMS again.

EXAM:
LEFT KNEE - COMPLETE 4+ VIEW

[x knee ap left (1 of 4)]
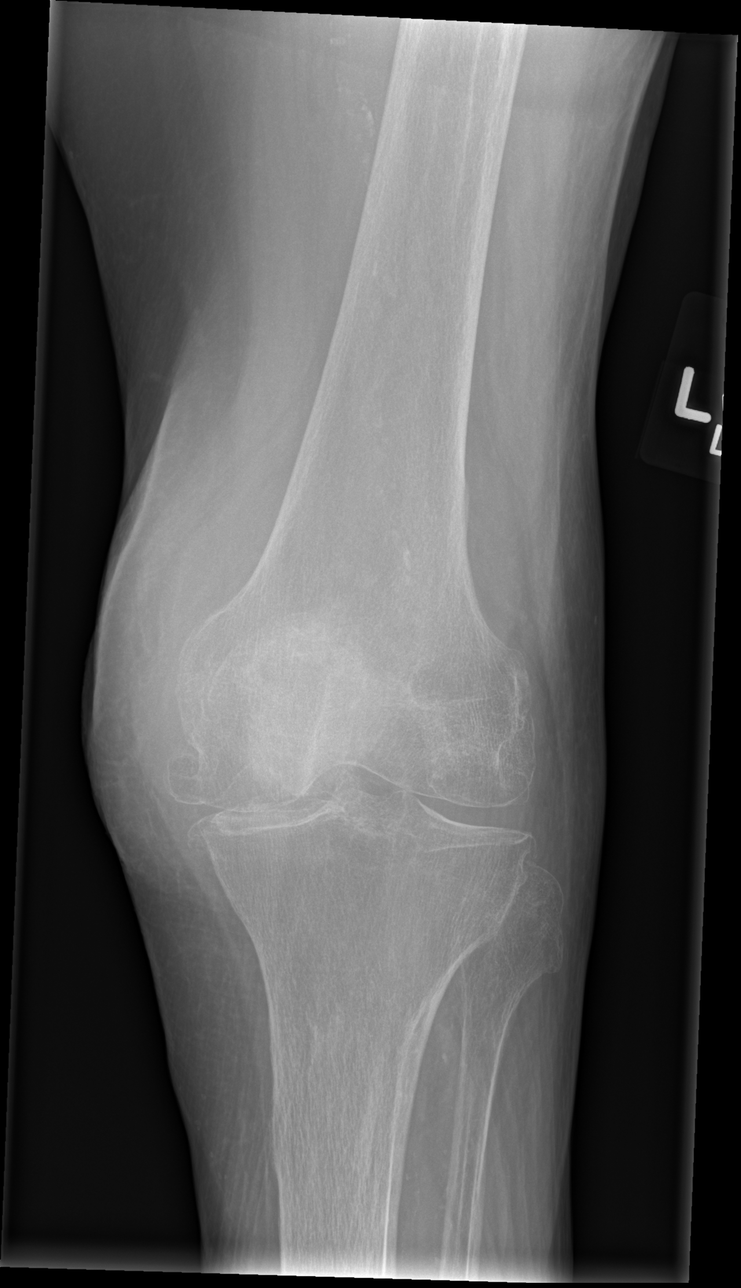

[x knee ap left (2 of 4)]
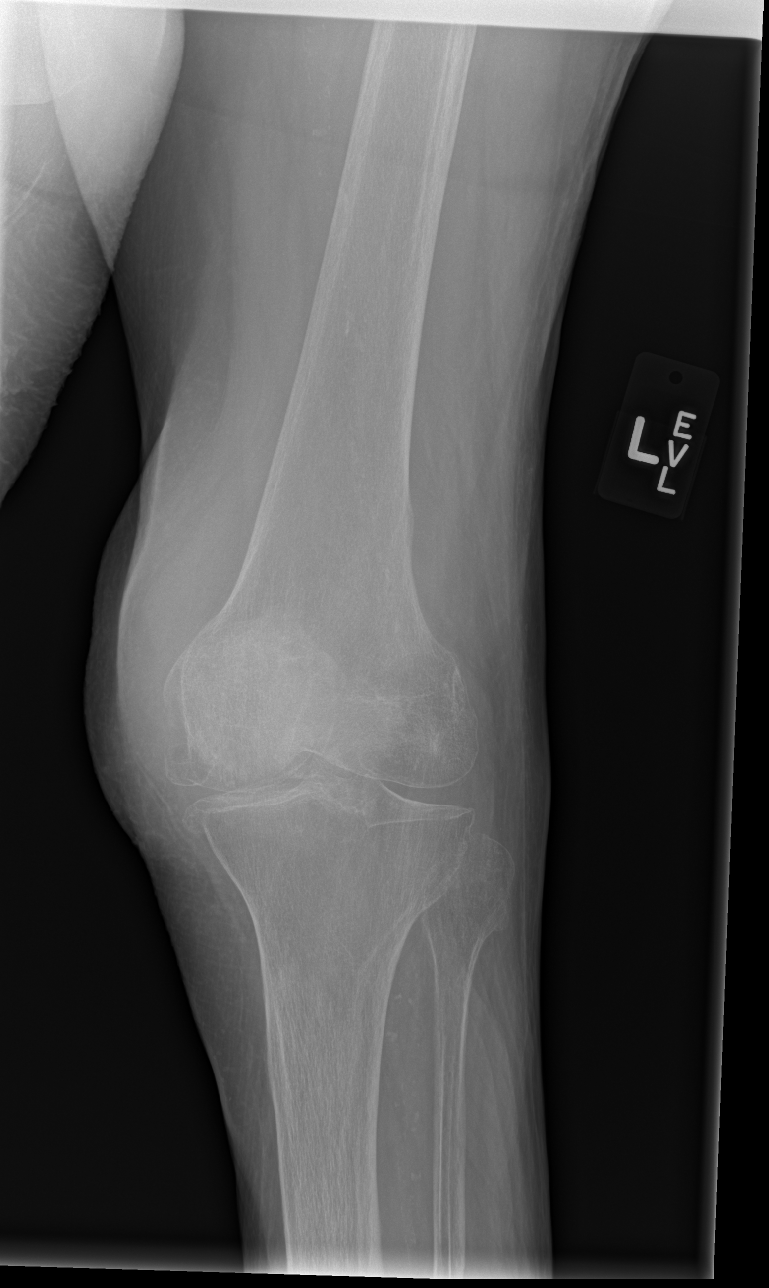

[x knee ap left (3 of 4)]
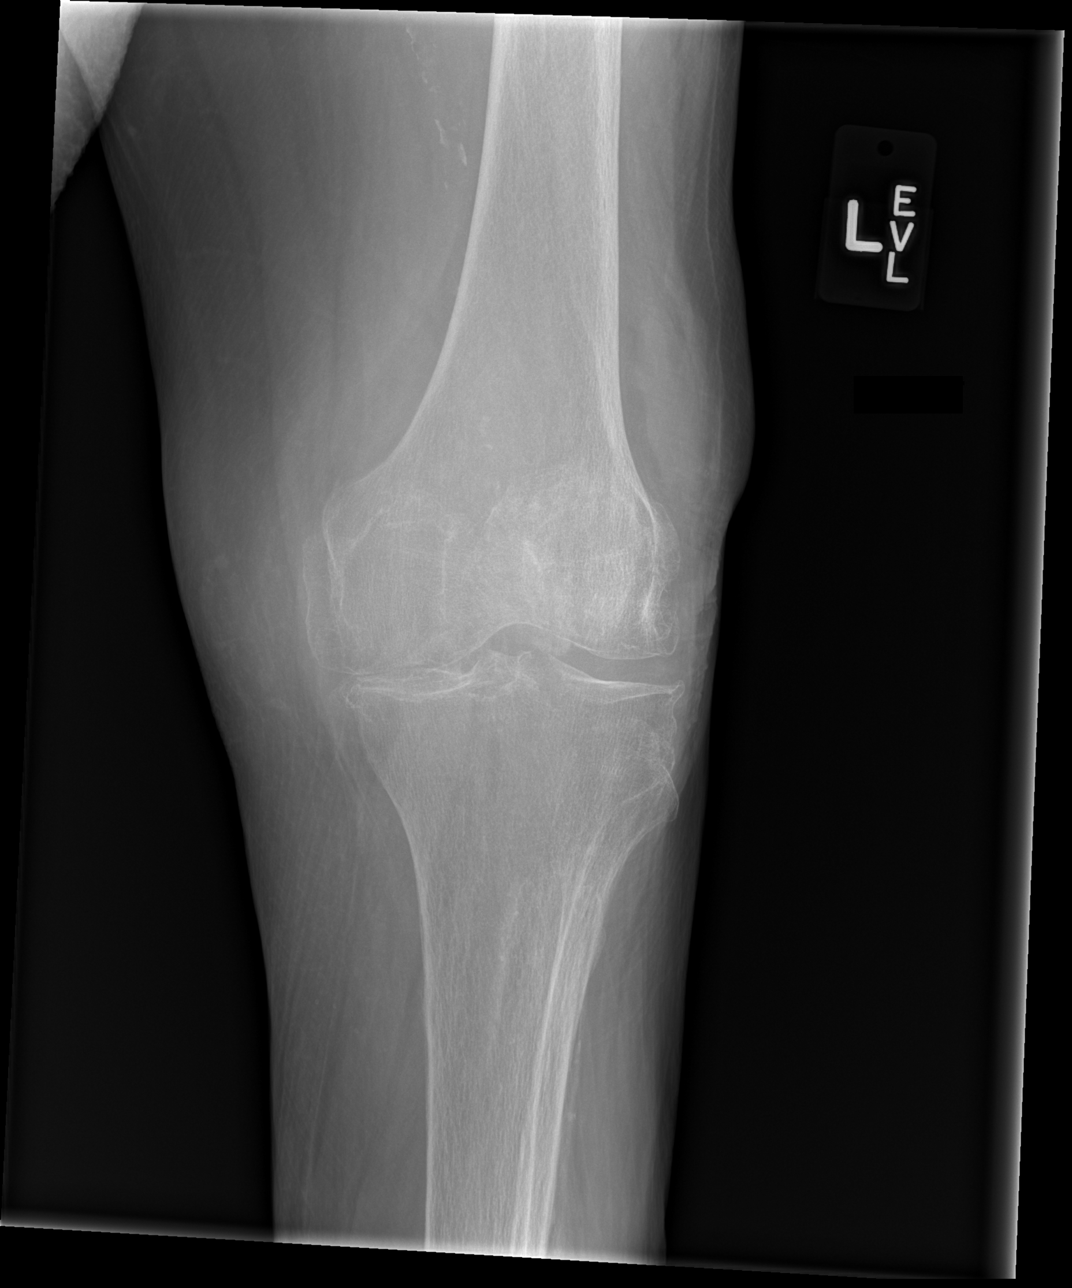

[x knee ap left (4 of 4)]
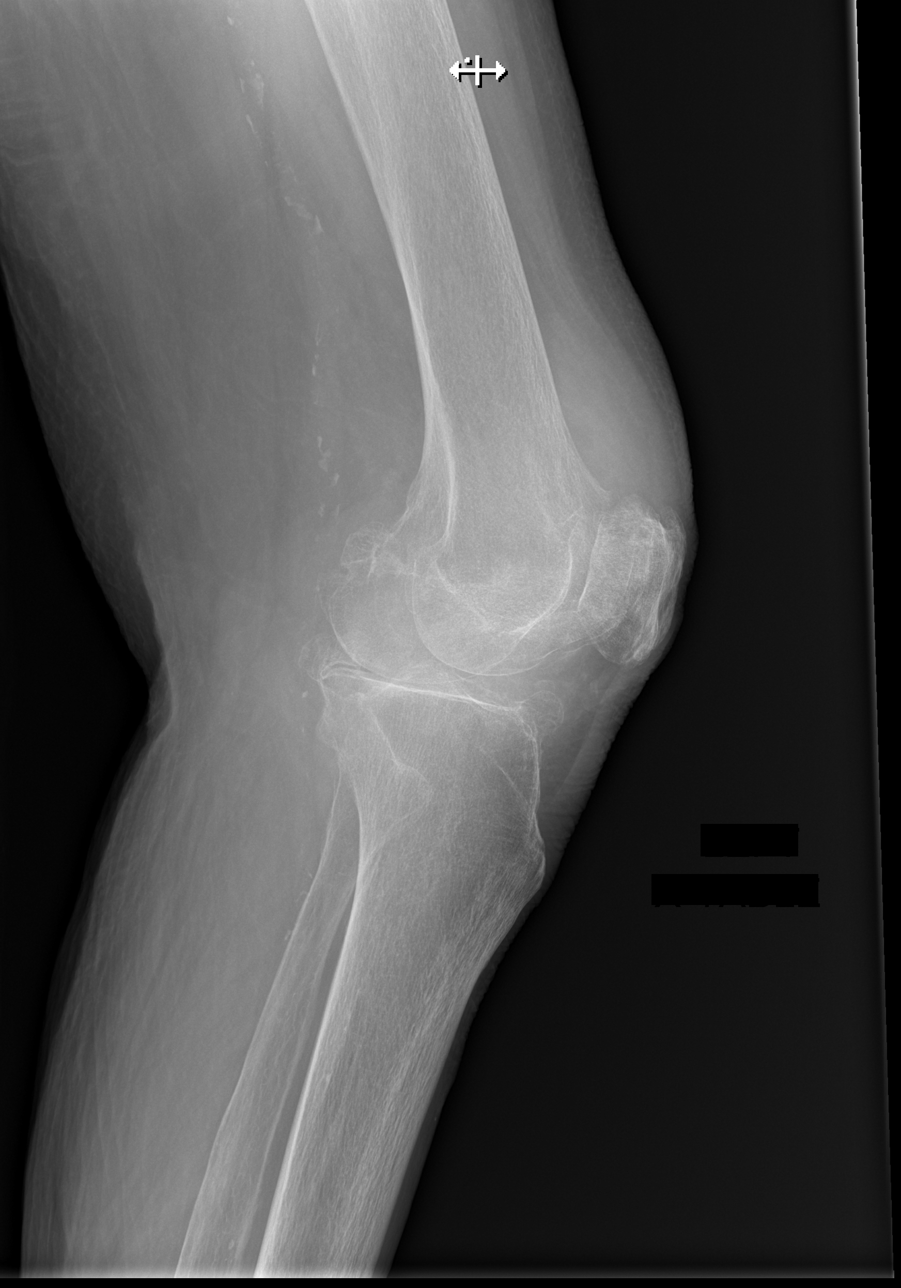

[4 of 4 positions shown; findings below may reference images not displayed]

FINDINGS: There is soft tissue swelling in the region of the left knee. Bones
appear osteopenic. Positioning on the lateral view is nonstandard.
However, joint effusion is suspected. There is no acute fracture or
subluxation. Moderate degenerative changes are present, involving
all compartments. There is dense atherosclerotic calcification of
the femoral artery.
IMPRESSION: Tricompartmental degenerative change.

Suspect joint effusion.
